# Patient Record
Sex: Female | Born: 1945 | Race: Black or African American | Hispanic: No | Smoking: Current every day smoker
Health system: Southern US, Community
[De-identification: ages and names within clinical notes are randomized; demographics above are authoritative.]

## PROBLEM LIST (undated history)

## (undated) DIAGNOSIS — F329 Major depressive disorder, single episode, unspecified: Secondary | ICD-10-CM

## (undated) DIAGNOSIS — B019 Varicella without complication: Secondary | ICD-10-CM

## (undated) DIAGNOSIS — F209 Schizophrenia, unspecified: Secondary | ICD-10-CM

## (undated) DIAGNOSIS — M1990 Unspecified osteoarthritis, unspecified site: Secondary | ICD-10-CM

## (undated) DIAGNOSIS — I10 Essential (primary) hypertension: Secondary | ICD-10-CM

## (undated) DIAGNOSIS — Z972 Presence of dental prosthetic device (complete) (partial): Secondary | ICD-10-CM

## (undated) DIAGNOSIS — K219 Gastro-esophageal reflux disease without esophagitis: Secondary | ICD-10-CM

## (undated) DIAGNOSIS — K5792 Diverticulitis of intestine, part unspecified, without perforation or abscess without bleeding: Secondary | ICD-10-CM

## (undated) DIAGNOSIS — E119 Type 2 diabetes mellitus without complications: Secondary | ICD-10-CM

## (undated) DIAGNOSIS — R42 Dizziness and giddiness: Secondary | ICD-10-CM

## (undated) DIAGNOSIS — F32A Depression, unspecified: Secondary | ICD-10-CM

## (undated) DIAGNOSIS — J449 Chronic obstructive pulmonary disease, unspecified: Secondary | ICD-10-CM

## (undated) DIAGNOSIS — E785 Hyperlipidemia, unspecified: Secondary | ICD-10-CM

## (undated) DIAGNOSIS — M5030 Other cervical disc degeneration, unspecified cervical region: Secondary | ICD-10-CM

## (undated) DIAGNOSIS — G4730 Sleep apnea, unspecified: Secondary | ICD-10-CM

## (undated) DIAGNOSIS — T7840XA Allergy, unspecified, initial encounter: Secondary | ICD-10-CM

## (undated) HISTORY — PX: CARPAL TUNNEL RELEASE: SHX101

## (undated) HISTORY — DX: Essential (primary) hypertension: I10

## (undated) HISTORY — DX: Type 2 diabetes mellitus without complications: E11.9

## (undated) HISTORY — PX: BARIATRIC SURGERY: SHX1103

## (undated) HISTORY — DX: Depression, unspecified: F32.A

## (undated) HISTORY — DX: Unspecified osteoarthritis, unspecified site: M19.90

## (undated) HISTORY — DX: Allergy, unspecified, initial encounter: T78.40XA

## (undated) HISTORY — DX: Hyperlipidemia, unspecified: E78.5

## (undated) HISTORY — DX: Chronic obstructive pulmonary disease, unspecified: J44.9

## (undated) HISTORY — DX: Major depressive disorder, single episode, unspecified: F32.9

## (undated) HISTORY — DX: Schizophrenia, unspecified: F20.9

## (undated) HISTORY — DX: Varicella without complication: B01.9

## (undated) HISTORY — PX: REPLACEMENT TOTAL KNEE BILATERAL: SUR1225

---
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
## 1992-07-22 HISTORY — PX: ABDOMINAL HYSTERECTOMY: SHX81

## 2003-03-30 LAB — HM DEXA SCAN: HM Dexa Scan: NORMAL

## 2010-07-22 HISTORY — PX: CHOLECYSTECTOMY: SHX55

## 2014-08-09 ENCOUNTER — Ambulatory Visit (INDEPENDENT_AMBULATORY_CARE_PROVIDER_SITE_OTHER): Payer: BLUE CROSS/BLUE SHIELD | Admitting: Internal Medicine

## 2014-08-09 ENCOUNTER — Encounter: Payer: Self-pay | Admitting: Internal Medicine

## 2014-08-09 ENCOUNTER — Encounter (INDEPENDENT_AMBULATORY_CARE_PROVIDER_SITE_OTHER): Payer: Self-pay

## 2014-08-09 VITALS — BP 132/84 | HR 80 | Temp 97.8°F | Ht 65.0 in | Wt 252.0 lb

## 2014-08-09 DIAGNOSIS — M75102 Unspecified rotator cuff tear or rupture of left shoulder, not specified as traumatic: Secondary | ICD-10-CM

## 2014-08-09 DIAGNOSIS — E785 Hyperlipidemia, unspecified: Secondary | ICD-10-CM

## 2014-08-09 DIAGNOSIS — J449 Chronic obstructive pulmonary disease, unspecified: Secondary | ICD-10-CM

## 2014-08-09 DIAGNOSIS — K219 Gastro-esophageal reflux disease without esophagitis: Secondary | ICD-10-CM

## 2014-08-09 DIAGNOSIS — M150 Primary generalized (osteo)arthritis: Secondary | ICD-10-CM

## 2014-08-09 DIAGNOSIS — I10 Essential (primary) hypertension: Secondary | ICD-10-CM

## 2014-08-09 DIAGNOSIS — M1990 Unspecified osteoarthritis, unspecified site: Secondary | ICD-10-CM | POA: Insufficient documentation

## 2014-08-09 DIAGNOSIS — Z131 Encounter for screening for diabetes mellitus: Secondary | ICD-10-CM

## 2014-08-09 DIAGNOSIS — M159 Polyosteoarthritis, unspecified: Secondary | ICD-10-CM

## 2014-08-09 LAB — LIPID PANEL
CHOL/HDL RATIO: 4
CHOLESTEROL: 154 mg/dL (ref 0–200)
HDL: 43.7 mg/dL (ref >39.00)
LDL CALC: 84 mg/dL (ref 0–99)
NONHDL: 110.3
Triglycerides: 131 mg/dL (ref 0.0–149.0)
VLDL: 26.2 mg/dL (ref 0.0–40.0)

## 2014-08-09 LAB — CBC
HEMATOCRIT: 43.1 % (ref 36.0–46.0)
HEMOGLOBIN: 14.1 g/dL (ref 12.0–15.0)
MCHC: 32.8 g/dL (ref 30.0–36.0)
MCV: 89.2 fl (ref 78.0–100.0)
Platelets: 287 10*3/uL (ref 150.0–400.0)
RBC: 4.83 Mil/uL (ref 3.87–5.11)
RDW: 14.4 % (ref 11.5–15.5)
WBC: 11 10*3/uL — ABNORMAL HIGH (ref 4.0–10.5)

## 2014-08-09 LAB — COMPREHENSIVE METABOLIC PANEL
ALT: 18 U/L (ref 0–35)
AST: 21 U/L (ref 0–37)
Albumin: 3.7 g/dL (ref 3.5–5.2)
Alkaline Phosphatase: 72 U/L (ref 39–117)
BUN: 11 mg/dL (ref 6–23)
CO2: 33 meq/L — AB (ref 19–32)
Calcium: 9.7 mg/dL (ref 8.4–10.5)
Chloride: 106 mEq/L (ref 96–112)
Creatinine, Ser: 0.78 mg/dL (ref 0.40–1.20)
GFR: 94.85 mL/min (ref >60.00)
Glucose, Bld: 144 mg/dL — ABNORMAL HIGH (ref 70–99)
POTASSIUM: 4.2 meq/L (ref 3.5–5.1)
Sodium: 141 mEq/L (ref 135–145)
Total Bilirubin: 0.3 mg/dL (ref 0.2–1.2)
Total Protein: 7 g/dL (ref 6.0–8.3)

## 2014-08-09 LAB — HEMOGLOBIN A1C: Hgb A1c MFr Bld: 6.8 % — ABNORMAL HIGH (ref 4.6–6.5)

## 2014-08-09 NOTE — Assessment & Plan Note (Signed)
Will review xray and MRI from previous provider Shoulder exercises given After I review the records, will likely refer to ortho fur further evaluation.

## 2014-08-09 NOTE — Progress Notes (Signed)
Pre visit review using our clinic review tool, if applicable. No additional management support is needed unless otherwise documented below in the visit note. 

## 2014-08-09 NOTE — Assessment & Plan Note (Signed)
Encouraged her to work on diet and exercsie She is s/p gastric sleeve

## 2014-08-09 NOTE — Assessment & Plan Note (Signed)
Well controlled HCTZ and Metoprolol On potassium supplement as well Will check CBC and CMET today

## 2014-08-09 NOTE — Patient Instructions (Signed)
Rotator Cuff Tear °The rotator cuff is four tendons that assist in the motion of the shoulder. A rotator cuff tear is a tear in one of these four tendons. It is characterized by pain and weakness of the shoulder. The rotator cuff tendons surround the shoulder ball and socket joint (humeral head). The rotator cuff tendons attach to the shoulder blade (scapula) on one side and the upper arm bone (humerus) on the other side. The rotator cuff is essential for shoulder stability and shoulder motion. °SYMPTOMS  °· Pain around the shoulder, often at the outer portion of the upper arm. °· Pain that is worse with shoulder function, especially when reaching overhead or lifting. °· Weakness of the shoulder muscles. °· Aching when not using your arm; often, pain awakens you at night, especially when sleeping on the affected side. °· Tenderness, swelling, warmth, or redness over the outer aspect of the shoulder. °· Loss of strength. °· Limited motion of the shoulder, especially reaching behind (reaching into one's back pocket) or across your body. °· A crackling sound (crepitation) when moving the shoulder. °· Biceps tendon pain (in the front of the shoulder) and inflammation, worse with bending the elbow or lifting. °CAUSES  °· Strain from sudden increase in amount or intensity of activity. °· Direct blow or injury to the shoulder. °· Aging, wear from from normal use. °· Roof of the shoulder (acromial) spur. °RISK INCREASES WITH:  °· Contact sports (football, wrestling, or boxing). °· Throwing or hitting sports (baseball, tennis, or volleyball). °· Weightlifting and bodybuilding. °· Heavy labor. °· Previous injury to rotator cuff. °· Failure to warm up properly before activity. °· Inadequate protective equipment. °· Increasing age. °· Spurring of the outer end of the scapula (acromion). °· Cortisone injections. °· Poor shoulder strength and flexibility. °PREVENTION °· Warm up and stretch properly before activity. °· Allow time  for rest and recovery between practices and competition. °· Maintain physical fitness: °¨ Cardiovascular fitness. °¨ Shoulder flexibility. °¨ Strength and endurance of the rotator cuff muscles and muscles of the shoulder blade. °· Learn and use proper technique when throwing or hitting. °PROGNOSIS °Surgery is often needed. Although, symptoms may go away by themselves. °RELATED COMPLICATIONS  °· Persistent pain that may progress to constant pain. °· Shoulder stiffness, frozen shoulder syndrome, or loss of motion. °· Recurrence of symptoms, especially if treated without surgery. °· Inability to return to same level of sports, even with surgery. °· Persistent weakness. °· Risks of surgery, including infection, bleeding, injury to nerves, shoulder stiffness, weakness, re-tearing of the rotator cuff tendon. °· Deltoid detachment, acromial fracture, and persistent pain. °TREATMENT °Treatment involves the use of ice and medicine to reduce pain and inflammation. Strengthening and stretching exercise are usually recommended. These exercises may be completed at home or with a therapist. You may also be instructed to modify offending activities. Corticosteroid injections may be given to reduce inflammation. Surgery is usually recommended for athletes. Surgery has the best chance for a full recovery. Surgery involves: °· Removal of an inflamed bursa. °· Removal of an acromial spur if present. °· Suturing the torn tendon back together. °Rotator cuff surgeries may be preformed either arthroscopically or through an open incision. Recovery typically takes 6 to 12 months. °MEDICATION °· If pain medicine is necessary, then nonsteroidal anti-inflammatory medicines, such as aspirin and ibuprofen, or other minor pain relievers, such as acetaminophen, are often recommended. °· Do not take pain medicine for 7 days before surgery. °· Prescription pain relievers are usually   only prescribed after surgery. Use only as directed and only as  much as you need. °· Corticosteroid injections may be given to reduce inflammation. However, there is a limited number of times the joint may be injected with these medicines. °HEAT AND COLD °· Cold treatment (icing) relieves pain and reduces inflammation. Cold treatment should be applied for 10 to 15 minutes every 2 to 3 hours for inflammation and pain and immediately after any activity that aggravates your symptoms. Use ice packs or massage the area with a piece of ice (ice massage). °· Heat treatment may be used prior to performing the stretching and strengthening activities prescribed by your caregiver, physical therapist, or athletic trainer. Use a heat pack or soak the injury in warm water. °SEEK MEDICAL CARE IF:  °· Symptoms get worse or do not improve in 4 to 6 weeks despite treatment. °· You experience pain, numbness, or coldness in the hand. °· Blue, gray, or dark color appears in the fingernails. °· New, unexplained symptoms develop (drugs used in treatment may produce side effects). °Document Released: 07/08/2005 Document Revised: 09/30/2011 Document Reviewed: 10/20/2008 °[employer]® Patient Information ©2015 [employer]. This information is not intended to replace advice given to you by your health care provider. Make sure you discuss any questions you have with your health care provider. ° °

## 2014-08-09 NOTE — Assessment & Plan Note (Signed)
Denies issues on crestor  Will check CMET and Lipid profile today

## 2014-08-09 NOTE — Assessment & Plan Note (Signed)
Mostly in hands and knees Continue Advil prn

## 2014-08-09 NOTE — Assessment & Plan Note (Signed)
She does continue to smoke but is weaning down PRN albuterol use Will refer to pulmonology

## 2014-08-09 NOTE — Assessment & Plan Note (Signed)
Well controlled on Nexium Will check CMET today

## 2014-08-09 NOTE — Progress Notes (Signed)
HPI  Pt presents to the clinic to establish care. She is transferring care from [place].  Flu: never Tetanus: Less than 10 years ago Pneumo: 04/2014 Zostovax: had one but unsure of date Colonoscopy: 2012 (5 years) Pap Smear: 12/2013 Mammogram: 01/2014 Bone Density: yes but unsure of date.  Arthritis: Mostly in hands and knees. She is taking Advil as needed.  HTN: BP well controlled on HCTZ and Metoprolol.  HLD: Denies myalgias on Crestor.  GERD: She denies breakthrough symptoms on Nexium.  She reports that she does have a left rotator cuff tear: She was being seen by the [facility]. She did recieve cortisone injections into her shoulder. She has also been to physical therapy but has not had good results. She continues to have pain in her left arm and is not able to raise it above her head. She would like referral to orthopedics today.  She also has chronic shortness of breath with exertion. She has been seen by cardiology and pulmonology. Cardiology did not think it was an issues with her heart. Pulmonology though it could be related to ? COPD. She has cut back on her smoking from 1.5 PPD to 1 pack every 3 days. She takes Albuterol prn. She request referral to pulmonology.  Past Medical History  Diagnosis Date  . Chicken pox   . Arthritis   . Allergy   . Hypertension   . Hyperlipidemia     Current Outpatient Prescriptions  Medication Sig Dispense Refill  . albuterol (PROVENTIL HFA;VENTOLIN HFA) 108 (90 BASE) MCG/ACT inhaler Inhale 2 puffs into the lungs every 6 (six) hours as needed for wheezing or shortness of breath.    . Cholecalciferol (VITAMIN D-3) 1000 UNITS CAPS Take 1 capsule by mouth daily.    Marland Kitchen esomeprazole (NEXIUM) 40 MG capsule Take 40 mg by mouth daily at 12 noon.    . hydrochlorothiazide (HYDRODIURIL) 12.5 MG tablet Take 12.5 mg by mouth daily.    . metoprolol succinate (TOPROL-XL) 100 MG 24 hr tablet Take 100 mg by mouth daily. Take with or immediately  following a meal.    . Potassium Chloride Crys CR (KLOR-CON M20 PO) Take 1 tablet by mouth daily.    . rosuvastatin (CRESTOR) 10 MG tablet Take 10 mg by mouth daily.     No current facility-administered medications for this visit.    Allergies  Allergen Reactions  . Aspirin Hives  . Darvon [Propoxyphene] Hives  . Latex Hives, Itching and Swelling  . Naproxen Hives and Itching    Family History  Problem Relation Age of Onset  . Heart disease Mother   . Arthritis Father   . Cancer Father     Prostate  . Diabetes Sister   . Diabetes Brother     History   Social History  . Marital Status: Widowed    Spouse Name: N/A    Number of Children: N/A  . Years of Education: N/A   Occupational History  . Not on file.   Social History Main Topics  . Smoking status: Current Every Day Smoker -- 0.33 packs/day    Types: Cigarettes  . Smokeless tobacco: Never Used  . Alcohol Use: No  . Drug Use: Not on file  . Sexual Activity: Not on file   Other Topics Concern  . Not on file   Social History Narrative  . No narrative on file    ROS:  Constitutional: Denies fever, malaise, fatigue, headache or abrupt weight changes.  HEENT: Denies  eye pain, eye redness, ear pain, ringing in the ears, wax buildup, runny nose, nasal congestion, bloody nose, or sore throat. Respiratory: Pt reports shortness of breath. Denies difficulty breathing, cough or sputum production.   Cardiovascular: Denies chest pain, chest tightness, palpitations or swelling in the hands or feet.  Gastrointestinal: Denies abdominal pain, bloating, constipation, diarrhea or blood in the stool.  Musculoskeletal: Pt reports joint pains. Denies decrease in range of motion, difficulty with gait, muscle pain or joint swelling.  Skin: Denies redness, rashes, lesions or ulcercations.  Neurological: Denies dizziness, difficulty with memory, difficulty with speech or problems with balance and coordination.  Psych: Denies  anxiety, depression, SI/HI.  No other specific complaints in a complete review of systems (except as listed in HPI above).  PE:  Ht 5\' 5"  (1.651 m)  Wt 252 lb (114.306 kg)  BMI 41.93 kg/m2 Wt Readings from Last 3 Encounters:  08/09/14 252 lb (114.306 kg)    General: Appears her stated age, well developed, well nourished in NAD. HEENT: Head: normal shape and size; Eyes: sclera white, no icterus, conjunctiva pink, PERRLA and EOMs intact;  Cardiovascular: Normal rate and rhythm. S1,S2 noted.  No murmur, rubs or gallops noted. No JVD or BLE edema. No carotid bruits noted. Pulmonary/Chest: Normal effort and positive vesicular breath sounds. No respiratory distress. No wheezes, rales or ronchi noted.  Abdomen: Soft and nontender. Normal bowel sounds, no bruits noted. No distention or masses noted. Liver, spleen and kidneys non palpable. Musculoskeletal: Normal range of motion. No signs of joint swelling. Herbdens nodes noted bilaterally. Positive drop can on the left. Neurological: Alert and oriented.  Psychiatric: Mood and affect normal. Behavior is normal. Judgment and thought content normal.    Assessment and Plan:  Health Maintenance:  Will review records to find out dates of zostovax and bone density exam Will check A1C given obesity and family history of DM 2 All medications will be adjusted/refilled after labs are back  RTC in 6 months to follow up chronic conditions

## 2014-08-10 ENCOUNTER — Telehealth: Payer: Self-pay | Admitting: Internal Medicine

## 2014-08-10 NOTE — Telephone Encounter (Signed)
emmi emailed °

## 2014-08-15 ENCOUNTER — Other Ambulatory Visit: Payer: Self-pay | Admitting: Internal Medicine

## 2014-08-15 ENCOUNTER — Telehealth: Payer: Self-pay | Admitting: *Deleted

## 2014-08-15 DIAGNOSIS — M25512 Pain in left shoulder: Secondary | ICD-10-CM

## 2014-08-15 DIAGNOSIS — E119 Type 2 diabetes mellitus without complications: Secondary | ICD-10-CM

## 2014-08-15 MED ORDER — METFORMIN HCL 500 MG PO TABS: 500.0000 mg | ORAL_TABLET | Freq: Two times a day (BID) | ORAL | Status: DC

## 2014-08-15 MED ORDER — TRAMADOL HCL 50 MG PO TABS: 50.0000 mg | ORAL_TABLET | Freq: Three times a day (TID) | ORAL | Status: DC | PRN

## 2014-08-15 NOTE — Telephone Encounter (Signed)
Pt returned call re: her lab results. She is interested in treatment, and wants to know what plan you have come up with for her?  She mentioned that she left noted from ortho she had seen in the past, and wanted to let you know she is still having severe shoulder pain. She is aware that you would refer her to ortho after reviewing notes. Please advise what to do for pain?  She also said that she would [facility]

## 2014-08-15 NOTE — Telephone Encounter (Signed)
Call pt:  For diabetes- I want her to work on low carb diet and will send in Metformin 500 mg BID with meals # 60, 2 refills, go ahead and send in RX  For shoulder pain, will go ahead and refer her to ortho. Can take tramadol 50 mg for pain. Please phone in 50 mg Q8H prn for pain # 30, 0 refills

## 2014-08-16 ENCOUNTER — Other Ambulatory Visit: Payer: Self-pay

## 2014-08-16 DIAGNOSIS — M25512 Pain in left shoulder: Secondary | ICD-10-CM

## 2014-08-16 MED ORDER — ESOMEPRAZOLE MAGNESIUM 40 MG PO CPDR: 40.0000 mg | DELAYED_RELEASE_CAPSULE | Freq: Every day | ORAL | Status: DC

## 2014-08-16 MED ORDER — ALBUTEROL SULFATE HFA 108 (90 BASE) MCG/ACT IN AERS
2.0000 | INHALATION_SPRAY | Freq: Four times a day (QID) | RESPIRATORY_TRACT | Status: DC | PRN
Start: 2014-08-16 — End: 2016-10-28

## 2014-08-16 MED ORDER — POTASSIUM CHLORIDE CRYS ER 20 MEQ PO TBCR: 20.0000 meq | EXTENDED_RELEASE_TABLET | Freq: Every day | ORAL | Status: DC

## 2014-08-16 MED ORDER — METOPROLOL SUCCINATE ER 100 MG PO TB24: 100.0000 mg | ORAL_TABLET | Freq: Every day | ORAL | Status: DC

## 2014-08-16 MED ORDER — ROSUVASTATIN CALCIUM 10 MG PO TABS: 10.0000 mg | ORAL_TABLET | Freq: Every day | ORAL | Status: DC

## 2014-08-16 MED ORDER — HYDROCHLOROTHIAZIDE 12.5 MG PO TABS: 12.5000 mg | ORAL_TABLET | Freq: Every day | ORAL | Status: DC

## 2014-08-16 MED ORDER — TRAMADOL HCL 50 MG PO TABS: 50.0000 mg | ORAL_TABLET | Freq: Three times a day (TID) | ORAL | Status: DC | PRN

## 2014-08-16 NOTE — Telephone Encounter (Signed)
Pt is aware as instructed--Rx called into pharmacy  

## 2014-09-01 ENCOUNTER — Encounter: Payer: Self-pay | Admitting: Internal Medicine

## 2014-09-01 DIAGNOSIS — E119 Type 2 diabetes mellitus without complications: Secondary | ICD-10-CM | POA: Insufficient documentation

## 2014-09-06 ENCOUNTER — Ambulatory Visit (INDEPENDENT_AMBULATORY_CARE_PROVIDER_SITE_OTHER): Payer: Medicare Other | Admitting: Internal Medicine

## 2014-09-06 ENCOUNTER — Encounter: Payer: Self-pay | Admitting: Internal Medicine

## 2014-09-06 VITALS — BP 122/72 | HR 91 | Ht 65.0 in | Wt 247.0 lb

## 2014-09-06 DIAGNOSIS — R0609 Other forms of dyspnea: Secondary | ICD-10-CM | POA: Diagnosis not present

## 2014-09-06 DIAGNOSIS — J449 Chronic obstructive pulmonary disease, unspecified: Secondary | ICD-10-CM | POA: Diagnosis not present

## 2014-09-06 DIAGNOSIS — G4733 Obstructive sleep apnea (adult) (pediatric): Secondary | ICD-10-CM

## 2014-09-06 DIAGNOSIS — R0600 Dyspnea, unspecified: Secondary | ICD-10-CM

## 2014-09-06 DIAGNOSIS — Z720 Tobacco use: Secondary | ICD-10-CM

## 2014-09-06 DIAGNOSIS — Z9989 Dependence on other enabling machines and devices: Secondary | ICD-10-CM

## 2014-09-06 DIAGNOSIS — F17200 Nicotine dependence, unspecified, uncomplicated: Secondary | ICD-10-CM | POA: Insufficient documentation

## 2014-09-06 NOTE — Assessment & Plan Note (Signed)
Multifactorial: Tobacco abuse, COPD, obesity, deconditioning.  Plan: -COPD optimization as stated -Tobacco Cessation -continue exercise program -Diet and weight loss.

## 2014-09-06 NOTE — Assessment & Plan Note (Signed)
Diagnosed in [place] 3 years ago, currently on CPAP wearing nightly. Currently awaiting records, continue with current CPAP usage. Address following with sleep specialist at next visit.   OSA Encouraged proper weight management.  Excessive weight may contribute to snoring.  Monitor sedative use.  Discussed driving precautions and its relationship with hypersomnolence.  Discussed operating dangerous equipment and its relationship with hypersomnolence.  Discussed sleep hygiene, and benefits of a fixed sleep waked time.  The importance of getting eight or more hours of sleep discussed with patient.  Discussed limiting the use of the computer and television before bedtime.  Decrease naps during the day, so night time sleep will become enhanced.  Limit caffeine, and sleep deprivation.  HTN, stroke, and heart failure are potential risk factors.

## 2014-09-06 NOTE — Assessment & Plan Note (Signed)
Tobacco Cessation - Counseling regarding benefits of smoking cessation strategies was provided for more than 12 min. - Educated that at this time smoking- cessation represents the single most important step that patient can take to enhance the length and quality of live. - Educated patient regarding alternatives of behavior interventions, pharmacotherapy including NRT and non-nicotine therapy such, and combinations of both. - Patient at this time:is willing to quit on her own, by reducing the daily use of cigarettes. Today we also set a quit date. - Quit date:12/21/2014

## 2014-09-06 NOTE — Assessment & Plan Note (Addendum)
COPD-mild type CAT score 5, mMRC score 1  I suspect the patient does have some underlying COPD, however she's not greatly symptomatic. She is probably a type A COPD who only requires bronchodilator when needed, does not need to be on maintenance bronchodilator therapy.  Plan: -Pulmonary function testing, 6 minute walk test, 2 view chest x-ray -Patient educated on the use of albuterol inhaler and advised to use only as a rescue medication for shortness of breath/wheezing/cough. -Tobacco Cessation will be paramount in alleviating some of the symptoms she is having related to her COPD. -[facility]

## 2014-09-06 NOTE — Patient Instructions (Addendum)
Follow up with Dr. Dema SeverinMungal in 1 month - use albuterol inhaler as needed for shortness of breath\cough\wheezing - pulmonary function testing,chest xray and 6 minute walk test prior to follow up visit - standard work up for COPD - continue using your cpap nightly - stop smoking (remember we set a quit date for 12/21/14).  - we will obtain records for you last sleep study.

## 2014-09-06 NOTE — Progress Notes (Signed)
Date: 09/06/2014  MRN# [id number] Kim Harding May 27, 1946  Referring Physician: NP R. Baity  Kim Harding is a 69 y.o. old female seen in consultation for dyspnea on exertion  CC:  Chief Complaint  Patient presents with  . Advice Only    Pt denies cough, wheezing and chest tightness; she may have sob with exhertion. She wears CPAP 4-6 hrs nightly.    HPI:  Patient is a pleasant 69 year old female is referred for further evaluation of COPD. Patient states that she recently moved to [facility], 05/2014, recently moved from [place], was seeing a pulmonologist there; told that she had mild copd, and placed placed on PRN albuterol. She also had a complaint of dyspnea on exertion which prompted the visit to see pulmonary today. The skin exertion history as stated below.  Currently attending the [facility], going 3 days a week, and participating their senior citizen cardio program.   DOE - baseline for 2-3 years, mostly with stair climbing, can climb about 1 flight stairs, can walk about 50 yards.  OSA - on cpap for the past 3 years, DME - [employer], wearing cpap machine nightly (4-6 hrs).  Had split study done in [place] 2015.    Smoking Hx - currently smoking 4-6 cigs per day - 1ppd x 50 years - quit for 1.5 years, but restarted 07/2002  Current COPD med - PRN albuterol inhaler Prior to PNA immunization, patient stated that she got a yearly URI sometimes requiring steroids and antibiotics, has not had an episode of this in the last 5 years.  No pets, worked in administration mostly. Allergies act up in the Spring - congestion, sneezing, runny nose, watery eyes.   - Spring 2015 required antibiotics and steroids due to bad allergy season causing sinusitis  Patient states that she's not using albuterol inhaler on a regular basis, maybe once a month if she really exerts himself. She is currently still smoking but is actively trying to quit. Today she denies any cough, worsening shortness of breath,  sputum production, fever, night sweats, chills, rapid weight loss. She also carries a diagnosis of sleep apnea, currently does not have a sleep specialist that she follows with.  PMHX:   Past Medical History  Diagnosis Date  . Chicken pox   . Arthritis   . Allergy   . Hypertension   . Hyperlipidemia    Surgical Hx:  Past Surgical History  Procedure Laterality Date  . Cholecystectomy  2012  . Tonsillectomy  1960  . Abdominal hysterectomy  1994    partial  . Replacement total knee bilateral    . Bariatric surgery      sleeve--2014   Family Hx:  Family History  Problem Relation Age of Onset  . Heart disease Mother   . Arthritis Father   . Cancer Father     Prostate  . Diabetes Sister   . Diabetes Brother    Social Hx:   History  Substance Use Topics  . Smoking status: Current Every Day Smoker -- 0.33 packs/day for 50 years    Types: Cigarettes  . Smokeless tobacco: Never Used  . Alcohol Use: No   Medication:   Current Outpatient Rx  Name  Route  Sig  Dispense  Refill  . albuterol (PROVENTIL HFA;VENTOLIN HFA) 108 (90 BASE) MCG/ACT inhaler   Inhalation   Inhale 2 puffs into the lungs every 6 (six) hours as needed for wheezing or shortness of breath.   6.7 Inhaler   3   .  Cholecalciferol (VITAMIN D-3) 1000 UNITS CAPS   Oral   Take 1 capsule by mouth daily.         Marland Kitchen esomeprazole (NEXIUM) 40 MG capsule   Oral   Take 1 capsule (40 mg total) by mouth daily at 12 noon.   90 capsule   3   . hydrochlorothiazide (HYDRODIURIL) 12.5 MG tablet   Oral   Take 1 tablet (12.5 mg total) by mouth daily.   90 tablet   1   . metFORMIN (GLUCOPHAGE) 500 MG tablet   Oral   Take 1 tablet (500 mg total) by mouth 2 (two) times daily with a meal.   60 tablet   2   . metoprolol succinate (TOPROL-XL) 100 MG 24 hr tablet   Oral   Take 1 tablet (100 mg total) by mouth daily. Take with or immediately following a meal.   90 tablet   1   . potassium chloride SA (KLOR-CON  M20) 20 MEQ tablet   Oral   Take 1 tablet (20 mEq total) by mouth daily.   90 tablet   0   . rosuvastatin (CRESTOR) 10 MG tablet   Oral   Take 1 tablet (10 mg total) by mouth daily.   90 tablet   1       Allergies:  Aspirin; Darvon; Latex; and Naproxen  Review of Systems: Gen:  Denies  fever, sweats, chills HEENT: Denies blurred vision, double vision, ear pain, eye pain, hearing loss, nose bleeds, sore throat Cvc:  No dizziness, chest pain or heaviness Resp:   Denies cough or sputum porduction, shortness of breath Gi: Denies swallowing difficulty, stomach pain, nausea or vomiting, diarrhea, constipation, bowel incontinence Gu:  Denies bladder incontinence, burning urine Ext:   No Joint pain, stiffness or swelling Skin: No skin rash, easy bruising or bleeding or hives Endoc:  No polyuria, polydipsia , polyphagia or weight change Psych: No depression, insomnia or hallucinations  Other:  All other systems negative  Physical Examination:   VS: BP 122/72 mmHg  Pulse 91  Ht  (1.651 m)  Wt 247 lb (112.038 kg)  BMI 41.10 kg/m2  SpO2 100%  General Appearance: No distress  Neuro:without focal findings, mental status, speech normal, alert and oriented, cranial nerves 2-12 intact, reflexes normal and symmetric, sensation grossly normal  HEENT: PERRLA, EOM intact, no ptosis, no other lesions noticed; Mallampati 2 Pulmonary: normal breath sounds., diaphragmatic excursion normal.No wheezing, No rales;   Sputum Production:  none CardiovascularNormal S1,S2.  No m/r/g.  Abdominal aorta pulsation normal.    Abdomen: Benign, Soft, non-tender, No masses, hepatosplenomegaly, No lymphadenopathy Renal:  No costovertebral tenderness  GU:  No performed at this time. Endoc: No evident thyromegaly, no signs of acromegaly or Cushing features Skin:   warm, no rashes, no ecchymosis  Extremities: normal, no cyanosis, clubbing, no edema, warm with normal capillary refill. Other findings:none      Assessment and Plan: Tobacco abuse Tobacco Cessation - Counseling regarding benefits of smoking cessation strategies was provided for more than 12 min. - Educated that at this time smoking- cessation represents the single most important step that patient can take to enhance the length and quality of live. - Educated patient regarding alternatives of behavior interventions, pharmacotherapy including NRT and non-nicotine therapy such, and combinations of both. - Patient at this time:is willing to quit on her own, by reducing the daily use of cigarettes. Today we also set a quit date. - Quit date:12/21/2014  COPD, mild COPD-mild type CAT score 5, mMRC score 1  I suspect the patient does have some underlying COPD, however she's not greatly symptomatic. She is probably a type A COPD who only requires bronchodilator when needed, does not need to be on maintenance bronchodilator therapy.  Plan: -Pulmonary function testing, 6 minute walk test, 2 view chest x-ray -Patient educated on the use of albuterol inhaler and advised to use only as a rescue medication for shortness of breath/wheezing/cough. -Tobacco Cessation will be paramount in alleviating some of the symptoms she is having related to her COPD. -[facility] at [facility]   DOE (dyspnea on exertion) Multifactorial: Tobacco abuse, COPD, obesity, deconditioning.  Plan: -COPD optimization as stated -Tobacco Cessation -continue exercise program -Diet and weight loss.     OSA on CPAP Diagnosed in [place] 3 years ago, currently on CPAP wearing nightly. Currently awaiting records, continue with current CPAP usage. Address following with sleep specialist at next visit.   OSA Encouraged proper weight management.  Excessive weight may contribute to snoring.  Monitor sedative use.  Discussed driving precautions and its relationship with hypersomnolence.  Discussed operating dangerous equipment and its  relationship with hypersomnolence.  Discussed sleep hygiene, and benefits of a fixed sleep waked time.  The importance of getting eight or more hours of sleep discussed with patient.  Discussed limiting the use of the computer and television before bedtime.  Decrease naps during the day, so night time sleep will become enhanced.  Limit caffeine, and sleep deprivation.  HTN, stroke, and heart failure are potential risk factors.          Updated Medication List Outpatient Encounter Prescriptions as of 09/06/2014  Medication Sig  . albuterol (PROVENTIL HFA;VENTOLIN HFA) 108 (90 BASE) MCG/ACT inhaler Inhale 2 puffs into the lungs every 6 (six) hours as needed for wheezing or shortness of breath.  . Cholecalciferol (VITAMIN D-3) 1000 UNITS CAPS Take 1 capsule by mouth daily.  Marland Kitchen. esomeprazole (NEXIUM) 40 MG capsule Take 1 capsule (40 mg total) by mouth daily at 12 noon.  . hydrochlorothiazide (HYDRODIURIL) 12.5 MG tablet Take 1 tablet (12.5 mg total) by mouth daily.  . metFORMIN (GLUCOPHAGE) 500 MG tablet Take 1 tablet (500 mg total) by mouth 2 (two) times daily with a meal.  . metoprolol succinate (TOPROL-XL) 100 MG 24 hr tablet Take 1 tablet (100 mg total) by mouth daily. Take with or immediately following a meal.  . potassium chloride SA (KLOR-CON M20) 20 MEQ tablet Take 1 tablet (20 mEq total) by mouth daily.  . rosuvastatin (CRESTOR) 10 MG tablet Take 1 tablet (10 mg total) by mouth daily.  . [DISCONTINUED] traMADol (ULTRAM) 50 MG tablet Take 1 tablet (50 mg total) by mouth every 8 (eight) hours as needed. (Patient not taking: Reported on 09/06/2014)    Orders for this visit: Orders Placed This Encounter  Procedures  . DG Chest 2 View    Standing Status: Future     Number of Occurrences:      Standing Expiration Date: 11/05/2015    Scheduling Instructions:     To be done week of 10/03/14.    Order Specific Question:  Reason for Exam (SYMPTOM  OR DIAGNOSIS REQUIRED)    Answer:  sob     Order Specific Question:  Preferred imaging location?    Answer:  [facility]  . Pulmonary function test    Standing Status: Future     Number of Occurrences:      Standing  Expiration Date: 09/07/2015    Scheduling Instructions:     Scheduled in [place] 10/10/14.[place]    Order Specific Question:  Where should this test be performed?    Answer:  [facility]    Order Specific Question:  Full PFT: includes the following: basic spirometry, spirometry pre & post bronchodilator, diffusion capacity (DLCO), lung volumes    Answer:  Full PFT    Order Specific Question:  MIP/MEP    Answer:  No    Order Specific Question:  6 minute walk    Answer:  Yes    Order Specific Question:  ABG    Answer:  No    Order Specific Question:  Lung volumes    Answer:  No    Order Specific Question:  Methacholine challenge    Answer:  No     Thank  you for the consultation and for allowing [facility] to assist in the care of your patient. Our recommendations are noted above.  Please contact us if we can be of further service.   Stephanie Acre, MD [facility] Office Number: [phone]

## 2014-09-12 ENCOUNTER — Ambulatory Visit (INDEPENDENT_AMBULATORY_CARE_PROVIDER_SITE_OTHER)
Admission: RE | Admit: 2014-09-12 | Discharge: 2014-09-12 | Disposition: A | Discharge status: Home / Self Care | Payer: BLUE CROSS/BLUE SHIELD | Source: Ambulatory Visit | Attending: Internal Medicine | Admitting: Internal Medicine

## 2014-09-12 DIAGNOSIS — R0600 Dyspnea, unspecified: Secondary | ICD-10-CM | POA: Diagnosis not present

## 2014-09-12 DIAGNOSIS — J449 Chronic obstructive pulmonary disease, unspecified: Secondary | ICD-10-CM

## 2014-09-12 DIAGNOSIS — Z87891 Personal history of nicotine dependence: Secondary | ICD-10-CM | POA: Diagnosis not present

## 2014-09-13 ENCOUNTER — Encounter: Payer: Self-pay | Admitting: Internal Medicine

## 2014-09-13 ENCOUNTER — Ambulatory Visit (INDEPENDENT_AMBULATORY_CARE_PROVIDER_SITE_OTHER): Payer: BLUE CROSS/BLUE SHIELD | Admitting: Internal Medicine

## 2014-09-13 VITALS — BP 132/78 | HR 74 | Temp 98.4°F | Wt 248.5 lb

## 2014-09-13 DIAGNOSIS — IMO0001 Reserved for inherently not codable concepts without codable children: Secondary | ICD-10-CM

## 2014-09-13 DIAGNOSIS — S46812D Strain of other muscles, fascia and tendons at shoulder and upper arm level, left arm, subsequent encounter: Secondary | ICD-10-CM | POA: Diagnosis not present

## 2014-09-13 DIAGNOSIS — M25512 Pain in left shoulder: Secondary | ICD-10-CM | POA: Diagnosis not present

## 2014-09-13 MED ORDER — IBUPROFEN 800 MG PO TABS: 800.0000 mg | ORAL_TABLET | Freq: Three times a day (TID) | ORAL | Status: DC | PRN

## 2014-09-13 NOTE — Progress Notes (Signed)
Pre visit review using our clinic review tool, if applicable. No additional management support is needed unless otherwise documented below in the visit note. 

## 2014-09-13 NOTE — Progress Notes (Signed)
Subjective:    Patient ID: Kim Harding, female    DOB: January 24, 1946, 69 y.o.   MRN: [id number]  HPI  Pt presents to the clinic today with continued left shoulder pain. 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 MRI from previous PCP reviewed. She reports the pain feels achy. It does have a slight burning sensation. She reports the pain is a 12 on a scale of 1-10. She has trouble lifting her arms above her head. It is interfering with her daily activities. The tramadol seems to help in the beginning but reports it is no longer effective. She has also been using Ibuprofen OTC (1200 mg at one time).  Review of Systems      Past Medical History  Diagnosis Date  . Chicken pox   . Arthritis   . Allergy   . Hypertension   . Hyperlipidemia     Current Outpatient Prescriptions  Medication Sig Dispense Refill  . albuterol (PROVENTIL HFA;VENTOLIN HFA) 108 (90 BASE) MCG/ACT inhaler Inhale 2 puffs into the lungs every 6 (six) hours as needed for wheezing or shortness of breath. 6.7 Inhaler 3  . Cholecalciferol (VITAMIN D-3) 1000 UNITS CAPS Take 1 capsule by mouth daily.    Marland Kitchen. esomeprazole (NEXIUM) 40 MG capsule Take 1 capsule (40 mg total) by mouth daily at 12 noon. 90 capsule 3  . hydrochlorothiazide (HYDRODIURIL) 12.5 MG tablet Take 1 tablet (12.5 mg total) by mouth daily. 90 tablet 1  . metFORMIN (GLUCOPHAGE) 500 MG tablet Take 1 tablet (500 mg total) by mouth 2 (two) times daily with a meal. 60 tablet 2  . metoprolol succinate (TOPROL-XL) 100 MG 24 hr tablet Take 1 tablet (100 mg total) by mouth daily. Take with or immediately following a meal. 90 tablet 1  . potassium chloride SA (KLOR-CON M20) 20 MEQ tablet Take 1 tablet (20 mEq total) by mouth daily. 90 tablet 0  . rosuvastatin (CRESTOR) 10 MG tablet Take 1 tablet (10 mg total) by mouth daily. 90 tablet 1   No current facility-administered medications for this visit.    Allergies  Allergen  Reactions  . Aspirin Hives  . Darvon [Propoxyphene] Hives  . Latex Hives, Itching and Swelling  . Naproxen Hives and Itching    Family History  Problem Relation Age of Onset  . Heart disease Mother   . Arthritis Father   . Cancer Father     Prostate  . Diabetes Sister   . Diabetes Brother     History   Social History  . Marital Status: Widowed    Spouse Name: N/A  . Number of Children: N/A  . Years of Education: N/A   Occupational History  . Not on file.   Social History Main Topics  . Smoking status: Current Every Day Smoker -- 0.33 packs/day for 50 years    Types: Cigarettes  . Smokeless tobacco: Never Used  . Alcohol Use: No  . Drug Use: No  . Sexual Activity: Not Currently   Other Topics Concern  . Not on file   Social History Narrative     Constitutional: Denies fever, malaise, fatigue, headache or abrupt weight changes.  Respiratory: Denies difficulty breathing, shortness of breath, cough or sputum production.   Cardiovascular: Denies chest pain, chest tightness, palpitations or swelling in the hands or feet.  Musculoskeletal: Pt reports left shoulder pain. Denies difficulty with gait, muscle pain or joint swelling.   No other specific complaints in  a complete review of systems (except as listed in HPI above).  Objective:   Physical Exam   BP 132/78 mmHg  Pulse 74  Temp(Src) 98.4 F (36.9 C) (Oral)  Wt 248 lb 8 oz (112.719 kg)  SpO2 98% Wt Readings from Last 3 Encounters:  09/13/14 248 lb 8 oz (112.719 kg)  09/06/14 247 lb (112.038 kg)  08/09/14 252 lb (114.306 kg)    General: Appears her stated age, obese but in NAD. Skin: Warm, dry and intact. No rashes, lesions or ulcerations noted. Cardiovascular: Normal rate and rhythm. S1,S2 noted.  No murmur, rubs or gallops noted.  Pulmonary/Chest: Normal effort and positive vesicular breath sounds. No respiratory distress. No wheezes, rales or ronchi noted.  Musculoskeletal: Decreased internal and  external rotation of left shoulder. Pain with palpation of the left AC joint and subacromial bursa. Some pain with palpation of the anterior proximal biceps tendon. Negative drop can test. Strength 4/5 LUE, 5/5 RUE. Neurological: Alert and oriented. Sensation intact to BUE.  BMET    Component Value Date/Time   NA 141 08/09/2014 1131   K 4.2 08/09/2014 1131   CL 106 08/09/2014 1131   CO2 33* 08/09/2014 1131   GLUCOSE 144* 08/09/2014 1131   BUN 11 08/09/2014 1131   CREATININE 0.78 08/09/2014 1131   CALCIUM 9.7 08/09/2014 1131    Lipid Panel     Component Value Date/Time   CHOL 154 08/09/2014 1131   TRIG 131.0 08/09/2014 1131   HDL 43.70 08/09/2014 1131   CHOLHDL 4 08/09/2014 1131   VLDL 26.2 08/09/2014 1131   LDLCALC 84 08/09/2014 1131    CBC    Component Value Date/Time   WBC 11.0* 08/09/2014 1131   RBC 4.83 08/09/2014 1131   HGB 14.1 08/09/2014 1131   HCT 43.1 08/09/2014 1131   PLT 287.0 08/09/2014 1131   MCV 89.2 08/09/2014 1131   MCHC 32.8 08/09/2014 1131   RDW 14.4 08/09/2014 1131    Hgb A1C Lab Results  Component Value Date   HGBA1C 6.8* 08/09/2014        Assessment & Plan:   Left shoulder pain secondary to supraspinatus tear:  MRI reviewed Will give RX for 800 mg Ibuprofen TID prn (advised her 1200 mg is way too much and to not take any additional Ibuprofen OTC) Will refer her to Orthopedics for further evaluation and treatment  RTC as needed or if your symptoms persist or worsen

## 2014-09-13 NOTE — Patient Instructions (Signed)

## 2014-09-14 DIAGNOSIS — M67912 Unspecified disorder of synovium and tendon, left shoulder: Secondary | ICD-10-CM | POA: Diagnosis not present

## 2014-09-14 DIAGNOSIS — M67911 Unspecified disorder of synovium and tendon, right shoulder: Secondary | ICD-10-CM | POA: Diagnosis not present

## 2014-09-16 ENCOUNTER — Telehealth: Payer: Self-pay | Admitting: Internal Medicine

## 2014-09-16 NOTE — Telephone Encounter (Signed)
Labs faxed

## 2014-09-16 NOTE — Telephone Encounter (Signed)
Nettie ElmSylvia from surgical center called requesting a copy of latest labs for pt.  Please fax to [phone] or call [phone] if you have questions.  She stated this is a private line so you can leave a detailed message.  Thanks.

## 2014-09-20 DIAGNOSIS — M19012 Primary osteoarthritis, left shoulder: Secondary | ICD-10-CM | POA: Diagnosis not present

## 2014-09-20 DIAGNOSIS — M66812 Spontaneous rupture of other tendons, left shoulder: Secondary | ICD-10-CM | POA: Diagnosis not present

## 2014-09-20 DIAGNOSIS — M75122 Complete rotator cuff tear or rupture of left shoulder, not specified as traumatic: Secondary | ICD-10-CM | POA: Diagnosis not present

## 2014-09-20 DIAGNOSIS — G8918 Other acute postprocedural pain: Secondary | ICD-10-CM | POA: Diagnosis not present

## 2014-09-20 DIAGNOSIS — M7542 Impingement syndrome of left shoulder: Secondary | ICD-10-CM | POA: Diagnosis not present

## 2014-09-20 HISTORY — PX: SHOULDER SURGERY: SHX246

## 2014-09-28 DIAGNOSIS — Z9889 Other specified postprocedural states: Secondary | ICD-10-CM | POA: Diagnosis not present

## 2014-09-29 DIAGNOSIS — M25612 Stiffness of left shoulder, not elsewhere classified: Secondary | ICD-10-CM | POA: Diagnosis not present

## 2014-09-29 DIAGNOSIS — M25512 Pain in left shoulder: Secondary | ICD-10-CM | POA: Diagnosis not present

## 2014-09-29 DIAGNOSIS — S43422D Sprain of left rotator cuff capsule, subsequent encounter: Secondary | ICD-10-CM | POA: Diagnosis not present

## 2014-10-03 DIAGNOSIS — M25612 Stiffness of left shoulder, not elsewhere classified: Secondary | ICD-10-CM | POA: Diagnosis not present

## 2014-10-03 DIAGNOSIS — M25512 Pain in left shoulder: Secondary | ICD-10-CM | POA: Diagnosis not present

## 2014-10-03 DIAGNOSIS — S43422D Sprain of left rotator cuff capsule, subsequent encounter: Secondary | ICD-10-CM | POA: Diagnosis not present

## 2014-10-07 DIAGNOSIS — M25512 Pain in left shoulder: Secondary | ICD-10-CM | POA: Diagnosis not present

## 2014-10-07 DIAGNOSIS — M25612 Stiffness of left shoulder, not elsewhere classified: Secondary | ICD-10-CM | POA: Diagnosis not present

## 2014-10-07 DIAGNOSIS — S43422D Sprain of left rotator cuff capsule, subsequent encounter: Secondary | ICD-10-CM | POA: Diagnosis not present

## 2014-10-10 ENCOUNTER — Ambulatory Visit: Payer: BLUE CROSS/BLUE SHIELD | Admitting: Internal Medicine

## 2014-10-10 ENCOUNTER — Ambulatory Visit: Payer: BLUE CROSS/BLUE SHIELD

## 2014-10-10 DIAGNOSIS — M25512 Pain in left shoulder: Secondary | ICD-10-CM | POA: Diagnosis not present

## 2014-10-10 DIAGNOSIS — M25612 Stiffness of left shoulder, not elsewhere classified: Secondary | ICD-10-CM | POA: Diagnosis not present

## 2014-10-10 DIAGNOSIS — S43422D Sprain of left rotator cuff capsule, subsequent encounter: Secondary | ICD-10-CM | POA: Diagnosis not present

## 2014-10-12 DIAGNOSIS — M25612 Stiffness of left shoulder, not elsewhere classified: Secondary | ICD-10-CM | POA: Diagnosis not present

## 2014-10-12 DIAGNOSIS — S43422D Sprain of left rotator cuff capsule, subsequent encounter: Secondary | ICD-10-CM | POA: Diagnosis not present

## 2014-10-12 DIAGNOSIS — M25512 Pain in left shoulder: Secondary | ICD-10-CM | POA: Diagnosis not present

## 2014-10-17 DIAGNOSIS — M25612 Stiffness of left shoulder, not elsewhere classified: Secondary | ICD-10-CM | POA: Diagnosis not present

## 2014-10-17 DIAGNOSIS — S43422D Sprain of left rotator cuff capsule, subsequent encounter: Secondary | ICD-10-CM | POA: Diagnosis not present

## 2014-10-17 DIAGNOSIS — M25512 Pain in left shoulder: Secondary | ICD-10-CM | POA: Diagnosis not present

## 2014-10-19 DIAGNOSIS — S43422D Sprain of left rotator cuff capsule, subsequent encounter: Secondary | ICD-10-CM | POA: Diagnosis not present

## 2014-10-19 DIAGNOSIS — M25512 Pain in left shoulder: Secondary | ICD-10-CM | POA: Diagnosis not present

## 2014-10-19 DIAGNOSIS — M25612 Stiffness of left shoulder, not elsewhere classified: Secondary | ICD-10-CM | POA: Diagnosis not present

## 2014-10-20 DIAGNOSIS — Z9889 Other specified postprocedural states: Secondary | ICD-10-CM | POA: Diagnosis not present

## 2014-10-24 ENCOUNTER — Ambulatory Visit (INDEPENDENT_AMBULATORY_CARE_PROVIDER_SITE_OTHER): Payer: BLUE CROSS/BLUE SHIELD | Admitting: Internal Medicine

## 2014-10-24 ENCOUNTER — Telehealth: Payer: Self-pay | Admitting: *Deleted

## 2014-10-24 ENCOUNTER — Encounter: Payer: Self-pay | Admitting: Internal Medicine

## 2014-10-24 VITALS — BP 128/66 | HR 94 | Ht 65.0 in | Wt 243.0 lb

## 2014-10-24 DIAGNOSIS — G4733 Obstructive sleep apnea (adult) (pediatric): Secondary | ICD-10-CM

## 2014-10-24 DIAGNOSIS — J449 Chronic obstructive pulmonary disease, unspecified: Secondary | ICD-10-CM

## 2014-10-24 DIAGNOSIS — R0609 Other forms of dyspnea: Secondary | ICD-10-CM | POA: Diagnosis not present

## 2014-10-24 DIAGNOSIS — Z720 Tobacco use: Secondary | ICD-10-CM | POA: Diagnosis not present

## 2014-10-24 DIAGNOSIS — R0600 Dyspnea, unspecified: Secondary | ICD-10-CM

## 2014-10-24 DIAGNOSIS — M25512 Pain in left shoulder: Secondary | ICD-10-CM | POA: Diagnosis not present

## 2014-10-24 DIAGNOSIS — Z9989 Dependence on other enabling machines and devices: Secondary | ICD-10-CM

## 2014-10-24 DIAGNOSIS — S43422D Sprain of left rotator cuff capsule, subsequent encounter: Secondary | ICD-10-CM | POA: Diagnosis not present

## 2014-10-24 DIAGNOSIS — M25612 Stiffness of left shoulder, not elsewhere classified: Secondary | ICD-10-CM | POA: Diagnosis not present

## 2014-10-24 LAB — PULMONARY FUNCTION TEST
DL/VA % PRED: 102 %
DL/VA: 5.04 ml/min/mmHg/L
DLCO unc % pred: 74 %
DLCO unc: 19.09 ml/min/mmHg
FEF 25-75 POST: 2.28 L/s
FEF 25-75 Pre: 3.03 L/sec
FEF2575-%Change-Post: -24 %
FEF2575-%Pred-Post: 121 %
FEF2575-%Pred-Pre: 161 %
FEV1-%CHANGE-POST: -1 %
FEV1-%Pred-Post: 100 %
FEV1-%Pred-Pre: 102 %
FEV1-Post: 2.02 L
FEV1-Pre: 2.05 L
FEV1FVC-%Change-Post: 1 %
FEV1FVC-%Pred-Pre: 112 %
FEV6-%CHANGE-POST: -3 %
FEV6-%PRED-POST: 90 %
FEV6-%Pred-Pre: 93 %
FEV6-Post: 2.26 L
FEV6-Pre: 2.34 L
FEV6FVC-%PRED-PRE: 103 %
FEV6FVC-%Pred-Post: 103 %
FVC-%Change-Post: -3 %
FVC-%PRED-PRE: 90 %
FVC-%Pred-Post: 87 %
FVC-PRE: 2.34 L
FVC-Post: 2.26 L
POST FEV6/FVC RATIO: 100 %
Post FEV1/FVC ratio: 89 %
Pre FEV1/FVC ratio: 88 %
Pre FEV6/FVC Ratio: 100 %
RV % PRED: 91 %
RV: 1.98 L
TLC % PRED: 83 %
TLC: 4.33 L

## 2014-10-24 MED ORDER — NICOTINE 14 MG/24HR TD PT24: 14.0000 mg | MEDICATED_PATCH | TRANSDERMAL | Status: DC

## 2014-10-24 NOTE — Progress Notes (Signed)
MRN# [id number] Kim Harding 09-26-1945   CC: Chief Complaint  Patient presents with  . Follow-up    COPD; SOB same, no cough, no chest tightness      Brief History: Synopsis: 69 year old female past medical history of obstructive sleep apnea, on CPAP, COPD, hypertension, transitioning care to pulmonary after recently moving from [place]. Has a history of mild COPD, with mild dyspnea on exertion that is now improving.  History of present illness February 2016: Patient is a pleasant 69 year old female is referred for further evaluation of COPD. Patient states that she recently moved to [facility], 05/2014, recently moved from [place], was seeing a pulmonologist there; told that she had mild copd, and placed placed on PRN albuterol. She also had a complaint of dyspnea on exertion which prompted the visit to see pulmonary today. The skin exertion history as stated below.  Currently attending the [facility], going 3 days a week, and participating their senior citizen cardio program.   DOE - baseline for 2-3 years, mostly with stair climbing, can climb about 1 flight stairs, can walk about 50 yards.  OSA - on cpap for the past 3 years, DME - [employer], wearing cpap machine nightly (4-6 hrs).  Had split study done in [place] 2015.    Smoking Hx - currently smoking 4-6 cigs per day - 1ppd x 50 years - quit for 1.5 years, but restarted 07/2002  Current COPD med - PRN albuterol inhaler Prior to PNA immunization, patient stated that she got a yearly URI sometimes requiring steroids and antibiotics, has not had an episode of this in the last 5 years.   No pets, worked in administration mostly. Allergies act up in the Spring - congestion, sneezing, runny nose, watery eyes.    - Spring 2015 required antibiotics and steroids due to bad allergy season causing sinusitis  Patient states that she's not using albuterol inhaler on a regular basis, maybe once a month if she really exerts himself. She is currently  still smoking but is actively trying to quit. Today she denies any cough, worsening shortness of breath, sputum production, fever, night sweats, chills, rapid weight loss. She also carries a diagnosis of sleep apnea, currently does not have a sleep specialist that she follows with. Plan: COPD optimization, exercise program, tobacco avoidance, continue with CPAP  Events since last clinic visit: Had Left rotator cuff surgery repair on 09/20/14, currently in physical therapy. No significant worsening of DOE, still smoking (about 1/4 ppd). Patient actually states that DOE is improving.  Had pfts and done today, would like to discuss the results.  Patient does have a history of obstructive sleep apnea currently on CPAP, currently awaiting a new mask from her DME [employer]) once her account transfers from [place] to [id number].   PMHX:   Past Medical History  Diagnosis Date  . Chicken pox   . Arthritis   . Allergy   . Hypertension   . Hyperlipidemia    Surgical Hx:  Past Surgical History  Procedure Laterality Date  . Cholecystectomy  2012  . Tonsillectomy  1960  . Abdominal hysterectomy  1994    partial  . Replacement total knee bilateral    . Bariatric surgery      sleeve--2014   Family Hx:  Family History  Problem Relation Age of Onset  . Heart disease Mother   . Arthritis Father   . Cancer Father     Prostate  . Diabetes Sister   . Diabetes Brother  Social Hx:   History  Substance Use Topics  . Smoking status: Current Every Day Smoker -- 0.33 packs/day for 50 years    Types: Cigarettes  . Smokeless tobacco: Never Used  . Alcohol Use: No   Medication:   Current Outpatient Rx  Name  Route  Sig  Dispense  Refill  . albuterol (PROVENTIL HFA;VENTOLIN HFA) 108 (90 BASE) MCG/ACT inhaler   Inhalation   Inhale 2 puffs into the lungs every 6 (six) hours as needed for wheezing or shortness of breath.   6.7 Inhaler   3   . Cholecalciferol (VITAMIN D-3) 1000 UNITS CAPS   Oral   Take  1 capsule by mouth daily.         Marland Kitchen. esomeprazole (NEXIUM) 40 MG capsule   Oral   Take 1 capsule (40 mg total) by mouth daily at 12 noon.   90 capsule   3   . hydrochlorothiazide (HYDRODIURIL) 12.5 MG tablet   Oral   Take 1 tablet (12.5 mg total) by mouth daily.   90 tablet   1   . ibuprofen (ADVIL,MOTRIN) 800 MG tablet   Oral   Take 1 tablet (800 mg total) by mouth every 8 (eight) hours as needed.   60 tablet   2   . metFORMIN (GLUCOPHAGE) 500 MG tablet   Oral   Take 1 tablet (500 mg total) by mouth 2 (two) times daily with a meal.   60 tablet   2   . metoprolol succinate (TOPROL-XL) 100 MG 24 hr tablet   Oral   Take 1 tablet (100 mg total) by mouth daily. Take with or immediately following a meal.   90 tablet   1   . potassium chloride SA (KLOR-CON M20) 20 MEQ tablet   Oral   Take 1 tablet (20 mEq total) by mouth daily.   90 tablet   0   . rosuvastatin (CRESTOR) 10 MG tablet   Oral   Take 1 tablet (10 mg total) by mouth daily.   90 tablet   1      Review of Systems: Gen:  Denies  fever, sweats, chills HEENT: Denies blurred vision, double vision, ear pain, eye pain, hearing loss, nose bleeds, sore throat Cvc:  No dizziness, chest pain or heaviness Resp:   Denies cough or sputum porduction, shortness of breath Gi: Denies swallowing difficulty, stomach pain, nausea or vomiting, diarrhea, constipation, bowel incontinence Gu:  Denies bladder incontinence, burning urine Ext:   No Joint pain, stiffness or swelling Skin: No skin rash, easy bruising or bleeding or hives Endoc:  No polyuria, polydipsia , polyphagia or weight change Psych: No depression, insomnia or hallucinations  Other:  All other systems negative  Allergies:  Aspirin; Darvon; Latex; and Naproxen  Physical Examination:  VS: BP 128/66 mmHg  Pulse 94  Ht 5\' 5"  (1.651 m)  Wt 243 lb (110.224 kg)  BMI 40.44 kg/m2  SpO2 98%  General Appearance: No distress  HEENT: PERRLA, EOM intact, no  ptosis, no other lesions noticed Pulmonary:Exam: normal breath sounds., diaphragmatic excursion normal.No wheezing, No rales   Cardiovascular:@ Exam:  Normal S1,S2.  No m/r/g.     Abdomen:Exam: Benign, Soft, non-tender, No masses  Skin:   warm, no rashes, no ecchymosis  Extremities: normal, no cyanosis, clubbing, no edema, warm with normal capillary refill.   Labs results:  BMP Lab Results  Component Value Date   NA 141 08/09/2014   K 4.2 08/09/2014   CL 106  08/09/2014   CO2 33* 08/09/2014   GLUCOSE 144* 08/09/2014   BUN 11 08/09/2014   CREATININE 0.78 08/09/2014     CBC CBC Latest Ref Rng 08/09/2014  WBC 4.0 - 10.5 K/uL 11.0(H)  Hemoglobin 12.0 - 15.0 g/dL 75.6  Hematocrit 43.3 - 46.0 % 43.1  Platelets 150.0 - 400.0 K/uL 287.0     Rad results: (The following images and results were reviewed by Dr. Dema Severin). CXR 08/2014 EXAM: CHEST 2 VIEW  COMPARISON: None.  FINDINGS: Cardiac shadow is within normal limits. The lungs are well aerated bilaterally. Mild interstitial changes are seen without focal infiltrate. No effusion or pneumothorax is noted.  IMPRESSION: No active cardiopulmonary disease.   Pulmonary function testing 10/23/2013 FEV1 102 FEV1/FVC 88 FVC 90, 2.34 RV 91 TLC 83 RV/TLC 108 ERV 81 DLCO 74 Interpretation normal PFTs mild decrease in DLCO, this can be seen in smokers.  Assessment and Plan: 69 year old female past medical history of obstructive sleep apnea, COPD, seen for followup visit today. COPD, mild COPD-mild type CAT score 5, mMRC score 1  a type A COPD who only requires bronchodilator when needed, does not need to be on maintenance bronchodilator therapy.  Also, has normal urinary function testing, and walked more thana 1000 feet on her 6 minute walk test without any significant dyspnea Patient is also up-to-date on all immunizations and vaccinations.  Plan: -Patient educated on the use of albuterol inhaler and advised to use only as a  rescue medication for shortness of breath/wheezing/cough. -Tobacco Cessation will be paramount in alleviating some of the symptoms she is having related to her COPD. -Continue exercising program at [facility] as tolerated   OSA on CPAP Diagnosed in [place] 3 years ago, currently on CPAP wearing nightly. Currently awaiting records, continue with current CPAP usage. Address following with sleep specialist at next visit.   OSA Encouraged proper weight management.  Excessive weight may contribute to snoring.  Monitor sedative use.  Discussed driving precautions and its relationship with hypersomnolence.  Discussed operating dangerous equipment and its relationship with hypersomnolence.  Discussed sleep hygiene, and benefits of a fixed sleep waked time.  The importance of getting eight or more hours of sleep discussed with patient.  Discussed limiting the use of the computer and television before bedtime.  Decrease naps during the day, so night time sleep will become enhanced.  Limit caffeine, and sleep deprivation.  HTN, stroke, and heart failure are potential risk factors.    Plan: -Awaiting records from patient, currently her DME account is being transferred from [place] to [facility], once this occurs she will be approved for a new mask.      Tobacco abuse Tobacco Cessation - Counseling regarding benefits of smoking cessation strategies was provided for more than 12 min. - Educated that at this time smoking- cessation represents the single most important step that patient can take to enhance the length and quality of live. - Educated patient regarding alternatives of behavior interventions, pharmacotherapy including NRT and non-nicotine therapy such, and combinations of both. - Patient at this time:is willing to try nicotine patches, nicotine 14 mcg prescription given to the patient - Quit date:12/21/2014       DOE (dyspnea on exertion) Multifactorial: Tobacco abuse,  COPD, obesity, deconditioning.  Patient states that her dyspnea on exertion is actually improving, and is not as bothersome as it has been in the past couple months.  Plan: -COPD optimization as stated -Tobacco Cessation -continue exercise program -Diet and weight loss.  Updated Medication List Outpatient Encounter Prescriptions as of 10/24/2014  Medication Sig  . albuterol (PROVENTIL HFA;VENTOLIN HFA) 108 (90 BASE) MCG/ACT inhaler Inhale 2 puffs into the lungs every 6 (six) hours as needed for wheezing or shortness of breath.  . Cholecalciferol (VITAMIN D-3) 1000 UNITS CAPS Take 1 capsule by mouth daily.  Marland Kitchen esomeprazole (NEXIUM) 40 MG capsule Take 1 capsule (40 mg total) by mouth daily at 12 noon.  . hydrochlorothiazide (HYDRODIURIL) 12.5 MG tablet Take 1 tablet (12.5 mg total) by mouth daily.  Marland Kitchen ibuprofen (ADVIL,MOTRIN) 800 MG tablet Take 1 tablet (800 mg total) by mouth every 8 (eight) hours as needed.  . metFORMIN (GLUCOPHAGE) 500 MG tablet Take 1 tablet (500 mg total) by mouth 2 (two) times daily with a meal.  . metoprolol succinate (TOPROL-XL) 100 MG 24 hr tablet Take 1 tablet (100 mg total) by mouth daily. Take with or immediately following a meal.  . potassium chloride SA (KLOR-CON M20) 20 MEQ tablet Take 1 tablet (20 mEq total) by mouth daily.  . rosuvastatin (CRESTOR) 10 MG tablet Take 1 tablet (10 mg total) by mouth daily.    Orders for this visit: No orders of the defined types were placed in this encounter.    Thank  you for the visitation and for allowing  [facility] to assist in the care of your patient. Our recommendations are noted above.  Please contact us if we can be of further service.  Stephanie Acre, MD [facility] Office Number: [phone]

## 2014-10-24 NOTE — Patient Instructions (Signed)
Follow up with Dr. Dema SeverinMungal in 3 months - nicotine patch 14mcg, 1 patch per day to the arm, alternate sites - tobacco cessation as discussed today - continue with exercise

## 2014-10-24 NOTE — Progress Notes (Signed)
PFT performed today. 

## 2014-10-24 NOTE — Progress Notes (Signed)
SMW performed today. 

## 2014-10-24 NOTE — Telephone Encounter (Signed)
Sent request to [employer] [employer] to get most recent download on patient. Pt does not have a [place] account with them. She got her machine in 2007. She will need to send her card into DME for download. Pt was informed during her office visit today.

## 2014-10-25 DIAGNOSIS — S43422D Sprain of left rotator cuff capsule, subsequent encounter: Secondary | ICD-10-CM | POA: Diagnosis not present

## 2014-10-25 DIAGNOSIS — M25512 Pain in left shoulder: Secondary | ICD-10-CM | POA: Diagnosis not present

## 2014-10-25 DIAGNOSIS — M25612 Stiffness of left shoulder, not elsewhere classified: Secondary | ICD-10-CM | POA: Diagnosis not present

## 2014-10-25 NOTE — Assessment & Plan Note (Signed)
Tobacco Cessation - Counseling regarding benefits of smoking cessation strategies was provided for more than 12 min. - Educated that at this time smoking- cessation represents the single most important step that patient can take to enhance the length and quality of live. - Educated patient regarding alternatives of behavior interventions, pharmacotherapy including NRT and non-nicotine therapy such, and combinations of both. - Patient at this time:is willing to try nicotine patches, nicotine 14 mcg prescription given to the patient - Quit date:12/21/2014

## 2014-10-25 NOTE — Assessment & Plan Note (Addendum)
COPD-mild type CAT score 5, mMRC score 1  a type A COPD who only requires bronchodilator when needed, does not need to be on maintenance bronchodilator therapy.  Also, has normal urinary function testing, and walked more thana 1000 feet on her 6 minute walk test without any significant dyspnea Patient is also up-to-date on all immunizations and vaccinations.  Plan: -Patient educated on the use of albuterol inhaler and advised to use only as a rescue medication for shortness of breath/wheezing/cough. -Tobacco Cessation will be paramount in alleviating some of the symptoms she is having related to her COPD. -Continue exercising program at [employer] as tolerated   

## 2014-10-25 NOTE — Assessment & Plan Note (Signed)
Multifactorial: Tobacco abuse, COPD, obesity, deconditioning.  Patient states that her dyspnea on exertion is actually improving, and is not as bothersome as it has been in the past couple months.  Plan: -COPD optimization as stated -Tobacco Cessation -continue exercise program -Diet and weight loss.

## 2014-10-25 NOTE — Assessment & Plan Note (Signed)
Diagnosed in [place] 3 years ago, currently on CPAP wearing nightly. Currently awaiting records, continue with current CPAP usage. Address following with sleep specialist at next visit.   OSA Encouraged proper weight management.  Excessive weight may contribute to snoring.  Monitor sedative use.  Discussed driving precautions and its relationship with hypersomnolence.  Discussed operating dangerous equipment and its relationship with hypersomnolence.  Discussed sleep hygiene, and benefits of a fixed sleep waked time.  The importance of getting eight or more hours of sleep discussed with patient.  Discussed limiting the use of the computer and television before bedtime.  Decrease naps during the day, so night time sleep will become enhanced.  Limit caffeine, and sleep deprivation.  HTN, stroke, and heart failure are potential risk factors.    Plan: -Awaiting records from patient, currently her DME [facility] is being [facility] [place] [facility], once this occurs she will be approved for a new mask.

## 2014-11-02 DIAGNOSIS — M25612 Stiffness of left shoulder, not elsewhere classified: Secondary | ICD-10-CM | POA: Diagnosis not present

## 2014-11-02 DIAGNOSIS — S43422D Sprain of left rotator cuff capsule, subsequent encounter: Secondary | ICD-10-CM | POA: Diagnosis not present

## 2014-11-02 DIAGNOSIS — M25512 Pain in left shoulder: Secondary | ICD-10-CM | POA: Diagnosis not present

## 2014-11-05 ENCOUNTER — Other Ambulatory Visit: Payer: Self-pay | Admitting: Internal Medicine

## 2014-11-09 DIAGNOSIS — M25612 Stiffness of left shoulder, not elsewhere classified: Secondary | ICD-10-CM | POA: Diagnosis not present

## 2014-11-09 DIAGNOSIS — M25512 Pain in left shoulder: Secondary | ICD-10-CM | POA: Diagnosis not present

## 2014-11-09 DIAGNOSIS — S43422D Sprain of left rotator cuff capsule, subsequent encounter: Secondary | ICD-10-CM | POA: Diagnosis not present

## 2014-11-11 ENCOUNTER — Other Ambulatory Visit: Payer: Self-pay | Admitting: Internal Medicine

## 2014-11-14 DIAGNOSIS — S43422D Sprain of left rotator cuff capsule, subsequent encounter: Secondary | ICD-10-CM | POA: Diagnosis not present

## 2014-11-14 DIAGNOSIS — M25512 Pain in left shoulder: Secondary | ICD-10-CM | POA: Diagnosis not present

## 2014-11-14 DIAGNOSIS — M25612 Stiffness of left shoulder, not elsewhere classified: Secondary | ICD-10-CM | POA: Diagnosis not present

## 2014-11-16 ENCOUNTER — Encounter: Payer: Self-pay | Admitting: Internal Medicine

## 2014-11-16 DIAGNOSIS — M25612 Stiffness of left shoulder, not elsewhere classified: Secondary | ICD-10-CM | POA: Diagnosis not present

## 2014-11-16 DIAGNOSIS — M25512 Pain in left shoulder: Secondary | ICD-10-CM | POA: Diagnosis not present

## 2014-11-16 DIAGNOSIS — S43422D Sprain of left rotator cuff capsule, subsequent encounter: Secondary | ICD-10-CM | POA: Diagnosis not present

## 2014-11-22 DIAGNOSIS — S43422D Sprain of left rotator cuff capsule, subsequent encounter: Secondary | ICD-10-CM | POA: Diagnosis not present

## 2014-11-22 DIAGNOSIS — M25612 Stiffness of left shoulder, not elsewhere classified: Secondary | ICD-10-CM | POA: Diagnosis not present

## 2014-11-22 DIAGNOSIS — M25512 Pain in left shoulder: Secondary | ICD-10-CM | POA: Diagnosis not present

## 2014-11-23 DIAGNOSIS — M25512 Pain in left shoulder: Secondary | ICD-10-CM | POA: Diagnosis not present

## 2014-11-23 DIAGNOSIS — S43422D Sprain of left rotator cuff capsule, subsequent encounter: Secondary | ICD-10-CM | POA: Diagnosis not present

## 2014-11-23 DIAGNOSIS — M25612 Stiffness of left shoulder, not elsewhere classified: Secondary | ICD-10-CM | POA: Diagnosis not present

## 2014-11-28 DIAGNOSIS — M25612 Stiffness of left shoulder, not elsewhere classified: Secondary | ICD-10-CM | POA: Diagnosis not present

## 2014-11-28 DIAGNOSIS — S43422D Sprain of left rotator cuff capsule, subsequent encounter: Secondary | ICD-10-CM | POA: Diagnosis not present

## 2014-11-28 DIAGNOSIS — M25512 Pain in left shoulder: Secondary | ICD-10-CM | POA: Diagnosis not present

## 2014-11-30 DIAGNOSIS — M25612 Stiffness of left shoulder, not elsewhere classified: Secondary | ICD-10-CM | POA: Diagnosis not present

## 2014-11-30 DIAGNOSIS — M25512 Pain in left shoulder: Secondary | ICD-10-CM | POA: Diagnosis not present

## 2014-11-30 DIAGNOSIS — Z9889 Other specified postprocedural states: Secondary | ICD-10-CM | POA: Diagnosis not present

## 2014-11-30 DIAGNOSIS — S43422D Sprain of left rotator cuff capsule, subsequent encounter: Secondary | ICD-10-CM | POA: Diagnosis not present

## 2014-12-05 DIAGNOSIS — M25512 Pain in left shoulder: Secondary | ICD-10-CM | POA: Diagnosis not present

## 2014-12-05 DIAGNOSIS — M25612 Stiffness of left shoulder, not elsewhere classified: Secondary | ICD-10-CM | POA: Diagnosis not present

## 2014-12-05 DIAGNOSIS — S43422D Sprain of left rotator cuff capsule, subsequent encounter: Secondary | ICD-10-CM | POA: Diagnosis not present

## 2014-12-07 DIAGNOSIS — M25512 Pain in left shoulder: Secondary | ICD-10-CM | POA: Diagnosis not present

## 2014-12-07 DIAGNOSIS — M25612 Stiffness of left shoulder, not elsewhere classified: Secondary | ICD-10-CM | POA: Diagnosis not present

## 2014-12-07 DIAGNOSIS — S43422D Sprain of left rotator cuff capsule, subsequent encounter: Secondary | ICD-10-CM | POA: Diagnosis not present

## 2014-12-12 DIAGNOSIS — M25512 Pain in left shoulder: Secondary | ICD-10-CM | POA: Diagnosis not present

## 2014-12-12 DIAGNOSIS — S43422D Sprain of left rotator cuff capsule, subsequent encounter: Secondary | ICD-10-CM | POA: Diagnosis not present

## 2014-12-12 DIAGNOSIS — M25612 Stiffness of left shoulder, not elsewhere classified: Secondary | ICD-10-CM | POA: Diagnosis not present

## 2014-12-14 DIAGNOSIS — M25512 Pain in left shoulder: Secondary | ICD-10-CM | POA: Diagnosis not present

## 2014-12-14 DIAGNOSIS — S43422D Sprain of left rotator cuff capsule, subsequent encounter: Secondary | ICD-10-CM | POA: Diagnosis not present

## 2014-12-14 DIAGNOSIS — M25612 Stiffness of left shoulder, not elsewhere classified: Secondary | ICD-10-CM | POA: Diagnosis not present

## 2014-12-20 ENCOUNTER — Telehealth: Payer: Self-pay

## 2014-12-20 NOTE — Telephone Encounter (Signed)
Pt came to office after finding out at [facility] that generic crestor is no less expensive than when was name brand and pt [facility]. [facility]. Pt will wait until her 01/2015 appt and then get all meds sent to mail order pharmacy.

## 2014-12-21 DIAGNOSIS — S43422D Sprain of left rotator cuff capsule, subsequent encounter: Secondary | ICD-10-CM | POA: Diagnosis not present

## 2014-12-21 DIAGNOSIS — M25612 Stiffness of left shoulder, not elsewhere classified: Secondary | ICD-10-CM | POA: Diagnosis not present

## 2014-12-21 DIAGNOSIS — M25512 Pain in left shoulder: Secondary | ICD-10-CM | POA: Diagnosis not present

## 2014-12-22 ENCOUNTER — Telehealth: Payer: Self-pay | Admitting: *Deleted

## 2014-12-22 NOTE — Telephone Encounter (Signed)
Left message on machine to ask patient to return call to make sure her mammogram is scheduled. 

## 2014-12-23 DIAGNOSIS — M25512 Pain in left shoulder: Secondary | ICD-10-CM | POA: Diagnosis not present

## 2014-12-23 DIAGNOSIS — M25612 Stiffness of left shoulder, not elsewhere classified: Secondary | ICD-10-CM | POA: Diagnosis not present

## 2014-12-23 DIAGNOSIS — S43422D Sprain of left rotator cuff capsule, subsequent encounter: Secondary | ICD-10-CM | POA: Diagnosis not present

## 2014-12-26 DIAGNOSIS — S43422D Sprain of left rotator cuff capsule, subsequent encounter: Secondary | ICD-10-CM | POA: Diagnosis not present

## 2014-12-26 DIAGNOSIS — M25612 Stiffness of left shoulder, not elsewhere classified: Secondary | ICD-10-CM | POA: Diagnosis not present

## 2014-12-26 DIAGNOSIS — M25512 Pain in left shoulder: Secondary | ICD-10-CM | POA: Diagnosis not present

## 2014-12-28 DIAGNOSIS — M25512 Pain in left shoulder: Secondary | ICD-10-CM | POA: Diagnosis not present

## 2014-12-28 DIAGNOSIS — M25612 Stiffness of left shoulder, not elsewhere classified: Secondary | ICD-10-CM | POA: Diagnosis not present

## 2014-12-28 DIAGNOSIS — S43422D Sprain of left rotator cuff capsule, subsequent encounter: Secondary | ICD-10-CM | POA: Diagnosis not present

## 2015-01-06 DIAGNOSIS — J209 Acute bronchitis, unspecified: Secondary | ICD-10-CM | POA: Diagnosis not present

## 2015-01-11 DIAGNOSIS — Z09 Encounter for follow-up examination after completed treatment for conditions other than malignant neoplasm: Secondary | ICD-10-CM | POA: Diagnosis not present

## 2015-01-11 DIAGNOSIS — S43422D Sprain of left rotator cuff capsule, subsequent encounter: Secondary | ICD-10-CM | POA: Diagnosis not present

## 2015-01-11 DIAGNOSIS — M25612 Stiffness of left shoulder, not elsewhere classified: Secondary | ICD-10-CM | POA: Diagnosis not present

## 2015-01-11 DIAGNOSIS — M25512 Pain in left shoulder: Secondary | ICD-10-CM | POA: Diagnosis not present

## 2015-01-13 DIAGNOSIS — M25612 Stiffness of left shoulder, not elsewhere classified: Secondary | ICD-10-CM | POA: Diagnosis not present

## 2015-01-13 DIAGNOSIS — S43422D Sprain of left rotator cuff capsule, subsequent encounter: Secondary | ICD-10-CM | POA: Diagnosis not present

## 2015-01-13 DIAGNOSIS — M25512 Pain in left shoulder: Secondary | ICD-10-CM | POA: Diagnosis not present

## 2015-01-19 ENCOUNTER — Ambulatory Visit (INDEPENDENT_AMBULATORY_CARE_PROVIDER_SITE_OTHER): Payer: BLUE CROSS/BLUE SHIELD | Admitting: Internal Medicine

## 2015-01-19 ENCOUNTER — Encounter: Payer: Self-pay | Admitting: Internal Medicine

## 2015-01-19 DIAGNOSIS — J449 Chronic obstructive pulmonary disease, unspecified: Secondary | ICD-10-CM

## 2015-01-19 DIAGNOSIS — Z720 Tobacco use: Secondary | ICD-10-CM

## 2015-01-19 DIAGNOSIS — G4733 Obstructive sleep apnea (adult) (pediatric): Secondary | ICD-10-CM | POA: Diagnosis not present

## 2015-01-19 DIAGNOSIS — Z9989 Dependence on other enabling machines and devices: Principal | ICD-10-CM

## 2015-01-19 NOTE — Assessment & Plan Note (Signed)
Tobacco Cessation - Counseling regarding benefits of smoking cessation strategies was provided for more than 12 min. - Educated that at this time smoking- cessation represents the single most important step that patient can take to enhance the length and quality of live. - Educated patient regarding alternatives of behavior interventions, pharmacotherapy including NRT and non-nicotine therapy such, and combinations of both. - Patient at this time down to smoking 2 cigarettes, nicotine patches were not working for her, currently she is trying nicotine lozenges.

## 2015-01-19 NOTE — Patient Instructions (Addendum)
Follow up with Dr. Dema SeverinMungal in 6 months - cont with as needed albuterol  - cont with wearing your cpap night - cont with your current amoxicillin regiment.  - avoid tobacco  - [facility], if desired

## 2015-01-19 NOTE — Assessment & Plan Note (Signed)
Diagnosed in [place] 3 years ago, currently on CPAP wearing nightly. Per records, CPAP 12 cm of water   OSA Encouraged proper weight management.  Excessive weight may contribute to snoring.  Monitor sedative use.  Discussed driving precautions and its relationship with hypersomnolence.  Discussed operating dangerous equipment and its relationship with hypersomnolence.  Discussed sleep hygiene, and benefits of a fixed sleep waked time.  The importance of getting eight or more hours of sleep discussed with patient.  Discussed limiting the use of the computer and television before bedtime.  Decrease naps during the day, so night time sleep will become enhanced.  Limit caffeine, and sleep deprivation.  HTN, stroke, and heart failure are potential risk factors.    Plan: - Continue with CPAP nightly

## 2015-01-19 NOTE — Assessment & Plan Note (Signed)
COPD-mild type CAT score 5, mMRC score 1  a type A COPD who only requires bronchodilator when needed, does not need to be on maintenance bronchodilator therapy.  Also, has normal urinary function testing, and walked more thana 1000 feet on her 6 minute walk test without any significant dyspnea Patient is also up-to-date on all immunizations and vaccinations.  Plan: -Patient educated on the use of albuterol inhaler and advised to use only as a rescue medication for shortness of breath/wheezing/cough. -Tobacco Cessation will be paramount in alleviating some of the symptoms she is having related to her COPD. -[facility] as tolerated

## 2015-01-19 NOTE — Progress Notes (Signed)
MRN# [id number] Kim Harding [birth year]   CC: Chief Complaint  Patient presents with  . Follow-up    Pt d/c nicotine patch and is now chewing the gum; Pt recently had tooth extraction and sinus infection.      Brief History: Synopsis: 69 year old female past medical history of obstructive sleep apnea, on CPAP, COPD, hypertension, transitioning care to pulmonary after recently moving from [place]. Has a history of mild COPD, with mild dyspnea on exertion that is now improving.  History of present illness February 2016: Patient is a pleasant 69 year old female is referred for further evaluation of COPD. Patient states that she recently moved to [facility], 05/2014, recently moved from [place], was seeing a pulmonologist there; told that she had mild copd, and placed placed on PRN albuterol. She also had a complaint of dyspnea on exertion which prompted the visit to see pulmonary today. The skin exertion history as stated below.  Currently attending the [facility], going 3 days a week, and participating their senior citizen cardio program.   DOE - baseline for 2-3 years, mostly with stair climbing, can climb about 1 flight stairs, can walk about 50 yards.  OSA - on cpap for the past 3 years, DME - [employer], wearing cpap machine nightly (4-6 hrs). Had split study done in [place] 2015.   Smoking Hx - currently smoking 4-6 cigs per day - 1ppd x 50 years - quit for 1.5 years, but restarted 07/2002  Current COPD med - PRN albuterol inhaler Prior to PNA immunization, patient stated that she got a yearly URI sometimes requiring steroids and antibiotics, has not had an episode of this in the last 5 years.  No pets, worked in administration mostly. Allergies act up in the Spring - congestion, sneezing, runny nose, watery eyes.  - Spring 2015 required antibiotics and steroids due to bad allergy season causing sinusitis  Patient states that she's not using albuterol inhaler on a regular basis, maybe  once a month if she really exerts himself. She is currently still smoking but is actively trying to quit. Today she denies any cough, worsening shortness of breath, sputum production, fever, night sweats, chills, rapid weight loss. She also carries a diagnosis of sleep apnea, currently does not have a sleep specialist that she follows with. Plan: COPD optimization, exercise program, tobacco avoidance, continue with CPAP  ROV 10/24/14 Had Left rotator cuff surgery repair on 09/20/14, currently in physical therapy. No significant worsening of DOE, still smoking (about 1/4 ppd). Patient actually states that DOE is improving.  Had pfts and done today, would like to discuss the results.  Patient does have a history of obstructive sleep apnea currently on CPAP, currently awaiting a new mask from her DME Christoper Allegra) once her account transfers from [place] to [facility].  Plan: -COPD optimization as stated (PRN rescue inhaler) -Tobacco Cessation -continue exercise program -Diet and weight loss.  Events since last clinic visit: Recent sinusitis infection while visiting [place], went to urgent care got antibiotics and steroids. Recent dental surgery, on right lower jaw.  Previously using nicotine patches, they kept falling off, now using nicotine lozenges.  Smoking about 2 cigs per day.  Today doing well, no cough, no sob. Will finish antibiotics on 01/21/15.    Medication:   Current Outpatient Rx  Name  Route  Sig  Dispense  Refill  . albuterol (PROVENTIL HFA;VENTOLIN HFA) 108 (90 BASE) MCG/ACT inhaler   Inhalation   Inhale 2 puffs into the lungs every 6 (six) hours  as needed for wheezing or shortness of breath.   6.7 Inhaler   3   . Cholecalciferol (VITAMIN D-3) 1000 UNITS CAPS   Oral   Take 1 capsule by mouth daily.         . clindamycin (CLEOCIN) 300 MG capsule            0   . docusate sodium (COLACE) 100 MG capsule            0   . esomeprazole (NEXIUM) 40 MG capsule   Oral   Take 1  capsule (40 mg total) by mouth daily at 12 noon.   90 capsule   3   . hydrochlorothiazide (HYDRODIURIL) 12.5 MG tablet   Oral   Take 1 tablet (12.5 mg total) by mouth daily.   90 tablet   1   . ibuprofen (ADVIL,MOTRIN) 800 MG tablet   Oral   Take 1 tablet (800 mg total) by mouth every 8 (eight) hours as needed.   60 tablet   2   . KLOR-CON M20 20 MEQ tablet      TAKE 1 TABLET (20 MEQ TOTAL) BY MOUTH DAILY.   90 tablet   0   . metFORMIN (GLUCOPHAGE) 500 MG tablet      TAKE 1 TABLET (500 MG TOTAL) BY MOUTH 2 (TWO) TIMES DAILY WITH A MEAL.   180 tablet   1   . metoprolol succinate (TOPROL-XL) 100 MG 24 hr tablet   Oral   Take 1 tablet (100 mg total) by mouth daily. Take with or immediately following a meal.   90 tablet   1   . nicotine (NICODERM CQ - DOSED IN MG/24 HOURS) 14 mg/24hr patch   Transdermal   Place 1 patch (14 mg total) onto the skin daily. Alternate sites daily,   30 patch   2   . oxyCODONE-acetaminophen (PERCOCET/ROXICET) 5-325 MG per tablet   Oral   Take by mouth. for pain      0   . rosuvastatin (CRESTOR) 10 MG tablet   Oral   Take 1 tablet (10 mg total) by mouth daily.   90 tablet   1      Review of Systems: Gen:  Denies  fever, sweats, chills HEENT: Denies blurred vision, double vision, ear pain, eye pain, hearing loss, nose bleeds, sore throat Cvc:  No dizziness, chest pain or heaviness Resp:   Admits to: Gi: Denies swallowing difficulty, stomach pain, nausea or vomiting, diarrhea, constipation, bowel incontinence Gu:  Denies bladder incontinence, burning urine Ext:   No Joint pain, stiffness or swelling Skin: No skin rash, easy bruising or bleeding or hives Endoc:  No polyuria, polydipsia , polyphagia or weight change Other:  All other systems negative  Allergies:  Aspirin; Darvon; Latex; and Naproxen  Physical Examination:  VS: BP 120/62 mmHg  Pulse 67  Temp(Src) 99 F (37.2 C) (Oral)  Ht 5\' 5"  (1.651 m)  Wt 234 lb (106.142  kg)  BMI 38.94 kg/m2  SpO2 99%  General Appearance: No distress  HEENT: PERRLA, no ptosis, no other lesions noticed Pulmonary:normal breath sounds., diaphragmatic excursion normal.No wheezing, No rales   Cardiovascular:  Normal S1,S2.  No m/r/g.     Abdomen:Exam: Benign, Soft, non-tender, No masses  Skin:   warm, no rashes, no ecchymosis  Extremities: normal, no cyanosis, clubbing, warm with normal capillary refill.     Assessment and Plan: 69 year old female seen in follow-up visit for mild COPD COPD, mild COPD-mild  type CAT score 5, mMRC score 1  a type A COPD who only requires bronchodilator when needed, does not need to be on maintenance bronchodilator therapy.  Also, has normal urinary function testing, and walked more thana 1000 feet on her 6 minute walk test without any significant dyspnea Patient is also up-to-date on all immunizations and vaccinations.  Plan: -Patient educated on the use of albuterol inhaler and advised to use only as a rescue medication for shortness of breath/wheezing/cough. -Tobacco Cessation will be paramount in alleviating some of the symptoms she is having related to her COPD. -Continue [facility] as tolerated    OSA on CPAP Diagnosed in [place] 3 years ago, currently on CPAP wearing nightly. Per records, CPAP 12 cm of water   OSA Encouraged proper weight management.  Excessive weight may contribute to snoring.  Monitor sedative use.  Discussed driving precautions and its relationship with hypersomnolence.  Discussed operating dangerous equipment and its relationship with hypersomnolence.  Discussed sleep hygiene, and benefits of a fixed sleep waked time.  The importance of getting eight or more hours of sleep discussed with patient.  Discussed limiting the use of the computer and television before bedtime.  Decrease naps during the day, so night time sleep will become enhanced.  Limit caffeine, and sleep deprivation.  HTN,  stroke, and heart failure are potential risk factors.    Plan: - Continue with CPAP nightly      Tobacco abuse Tobacco Cessation - Counseling regarding benefits of smoking cessation strategies was provided for more than 12 min. - Educated that at this time smoking- cessation represents the single most important step that patient can take to enhance the length and quality of live. - Educated patient regarding alternatives of behavior interventions, pharmacotherapy including NRT and non-nicotine therapy such, and combinations of both. - Patient at this time down to smoking 2 cigarettes, nicotine patches were not working for her, currently she is trying nicotine lozenges.          Updated Medication List Outpatient Encounter Prescriptions as of 01/19/2015  Medication Sig  . albuterol (PROVENTIL HFA;VENTOLIN HFA) 108 (90 BASE) MCG/ACT inhaler Inhale 2 puffs into the lungs every 6 (six) hours as needed for wheezing or shortness of breath.  . Cholecalciferol (VITAMIN D-3) 1000 UNITS CAPS Take 1 capsule by mouth daily.  . clindamycin (CLEOCIN) 300 MG capsule   . docusate sodium (COLACE) 100 MG capsule   . esomeprazole (NEXIUM) 40 MG capsule Take 1 capsule (40 mg total) by mouth daily at 12 noon.  . hydrochlorothiazide (HYDRODIURIL) 12.5 MG tablet Take 1 tablet (12.5 mg total) by mouth daily.  Marland Kitchen. ibuprofen (ADVIL,MOTRIN) 800 MG tablet Take 1 tablet (800 mg total) by mouth every 8 (eight) hours as needed.  Marland Kitchen. KLOR-CON M20 20 MEQ tablet TAKE 1 TABLET (20 MEQ TOTAL) BY MOUTH DAILY.  . metFORMIN (GLUCOPHAGE) 500 MG tablet TAKE 1 TABLET (500 MG TOTAL) BY MOUTH 2 (TWO) TIMES DAILY WITH A MEAL.  . metoprolol succinate (TOPROL-XL) 100 MG 24 hr tablet Take 1 tablet (100 mg total) by mouth daily. Take with or immediately following a meal.  . nicotine (NICODERM CQ - DOSED IN MG/24 HOURS) 14 mg/24hr patch Place 1 patch (14 mg total) onto the skin daily. Alternate sites daily,  .  oxyCODONE-acetaminophen (PERCOCET/ROXICET) 5-325 MG per tablet Take by mouth. for pain  . rosuvastatin (CRESTOR) 10 MG tablet Take 1 tablet (10 mg total) by mouth daily.   No facility-administered encounter  medications on file as of 01/19/2015.    Orders for this visit: Orders Placed This Encounter  Procedures  . Ambulatory Referral for DME    Referral Priority:  Routine    Referral Type:  Durable Medical Equipment Purchase    Number of Visits Requested:  1    Thank  you for the visitation and for allowing  [facility] to assist in the care of your patient. Our recommendations are noted above.  Please contact us if we can be of further service.  Stephanie Acre, MD [facility] Office Number: [phone]

## 2015-01-26 ENCOUNTER — Telehealth: Payer: Self-pay | Admitting: Internal Medicine

## 2015-01-26 NOTE — Telephone Encounter (Signed)
Rec'd from [facility] forward 7 pages to Dr.Mungal

## 2015-02-07 ENCOUNTER — Ambulatory Visit (INDEPENDENT_AMBULATORY_CARE_PROVIDER_SITE_OTHER): Payer: Medicare Other | Admitting: Internal Medicine

## 2015-02-07 ENCOUNTER — Encounter: Payer: Self-pay | Admitting: Internal Medicine

## 2015-02-07 VITALS — BP 130/86 | HR 73 | Temp 98.3°F | Wt 236.0 lb

## 2015-02-07 DIAGNOSIS — G4733 Obstructive sleep apnea (adult) (pediatric): Secondary | ICD-10-CM | POA: Diagnosis not present

## 2015-02-07 DIAGNOSIS — M150 Primary generalized (osteo)arthritis: Secondary | ICD-10-CM

## 2015-02-07 DIAGNOSIS — Z9989 Dependence on other enabling machines and devices: Secondary | ICD-10-CM

## 2015-02-07 DIAGNOSIS — K219 Gastro-esophageal reflux disease without esophagitis: Secondary | ICD-10-CM

## 2015-02-07 DIAGNOSIS — B3731 Acute candidiasis of vulva and vagina: Secondary | ICD-10-CM

## 2015-02-07 DIAGNOSIS — I10 Essential (primary) hypertension: Secondary | ICD-10-CM

## 2015-02-07 DIAGNOSIS — M159 Polyosteoarthritis, unspecified: Secondary | ICD-10-CM

## 2015-02-07 DIAGNOSIS — J449 Chronic obstructive pulmonary disease, unspecified: Secondary | ICD-10-CM

## 2015-02-07 DIAGNOSIS — E119 Type 2 diabetes mellitus without complications: Secondary | ICD-10-CM | POA: Diagnosis not present

## 2015-02-07 DIAGNOSIS — E785 Hyperlipidemia, unspecified: Secondary | ICD-10-CM

## 2015-02-07 DIAGNOSIS — Z720 Tobacco use: Secondary | ICD-10-CM

## 2015-02-07 DIAGNOSIS — M75102 Unspecified rotator cuff tear or rupture of left shoulder, not specified as traumatic: Secondary | ICD-10-CM

## 2015-02-07 DIAGNOSIS — B373 Candidiasis of vulva and vagina: Secondary | ICD-10-CM

## 2015-02-07 LAB — CBC WITH DIFFERENTIAL/PLATELET
BASOS ABS: 0 10*3/uL (ref 0.0–0.1)
Basophils Relative: 0.3 % (ref 0.0–3.0)
EOS ABS: 0.4 10*3/uL (ref 0.0–0.7)
Eosinophils Relative: 3.8 % (ref 0.0–5.0)
HEMATOCRIT: 44.3 % (ref 36.0–46.0)
Hemoglobin: 14.7 g/dL (ref 12.0–15.0)
LYMPHS ABS: 3.4 10*3/uL (ref 0.7–4.0)
Lymphocytes Relative: 34.7 % (ref 12.0–46.0)
MCHC: 33.1 g/dL (ref 30.0–36.0)
MCV: 87.5 fl (ref 78.0–100.0)
MONOS PCT: 8.3 % (ref 3.0–12.0)
Monocytes Absolute: 0.8 10*3/uL (ref 0.1–1.0)
NEUTROS ABS: 5.2 10*3/uL (ref 1.4–7.7)
Neutrophils Relative %: 52.9 % (ref 43.0–77.0)
Platelets: 327 10*3/uL (ref 150.0–400.0)
RBC: 5.07 Mil/uL (ref 3.87–5.11)
RDW: 14.6 % (ref 11.5–15.5)
WBC: 9.8 10*3/uL (ref 4.0–10.5)

## 2015-02-07 LAB — LIPID PANEL
Cholesterol: 153 mg/dL (ref 0–200)
HDL: 45 mg/dL (ref >39.00)
LDL Cholesterol: 83 mg/dL (ref 0–99)
NonHDL: 108
TRIGLYCERIDES: 123 mg/dL (ref 0.0–149.0)
Total CHOL/HDL Ratio: 3
VLDL: 24.6 mg/dL (ref 0.0–40.0)

## 2015-02-07 LAB — COMPREHENSIVE METABOLIC PANEL
ALK PHOS: 78 U/L (ref 39–117)
ALT: 15 U/L (ref 0–35)
AST: 15 U/L (ref 0–37)
Albumin: 4.1 g/dL (ref 3.5–5.2)
BILIRUBIN TOTAL: 0.5 mg/dL (ref 0.2–1.2)
BUN: 9 mg/dL (ref 6–23)
CO2: 32 meq/L (ref 19–32)
Calcium: 9.8 mg/dL (ref 8.4–10.5)
Chloride: 103 mEq/L (ref 96–112)
Creatinine, Ser: 0.74 mg/dL (ref 0.40–1.20)
GFR: 100.64 mL/min (ref >60.00)
GLUCOSE: 103 mg/dL — AB (ref 70–99)
Potassium: 3.9 mEq/L (ref 3.5–5.1)
SODIUM: 140 meq/L (ref 135–145)
TOTAL PROTEIN: 7.4 g/dL (ref 6.0–8.3)

## 2015-02-07 LAB — HEMOGLOBIN A1C: Hgb A1c MFr Bld: 6.2 % (ref 4.6–6.5)

## 2015-02-07 MED ORDER — TERCONAZOLE 0.4 % VA CREA: 1.0000 | TOPICAL_CREAM | Freq: Every day | VAGINAL | Status: DC

## 2015-02-07 NOTE — Assessment & Plan Note (Signed)
Will repeat A1C and microalbumin today She will self refer for an eye exam She declines flu Pneumovax 03/2014 She will make nurse visit for Prevnar 03/2014 Discussed foods to avoid We will send in meter, strips and lancets and she will start keeping a log of her sugars We will continue Metformin at this time

## 2015-02-07 NOTE — Patient Instructions (Signed)
Diabetes and Standards of Medical Care Diabetes is complicated. You may find that your diabetes team includes a dietitian, nurse, diabetes educator, eye doctor, and more. To help everyone know what is going on and to help you get the care you deserve, the following schedule of care was developed to help keep you on track. Below are the tests, exams, vaccines, medicines, education, and plans you will need. HbA1c test This test shows how well you have controlled your glucose over the past 2-3 months. It is used to see if your diabetes management plan needs to be adjusted.   It is performed at least 2 times a year if you are meeting treatment goals.  It is performed 4 times a year if therapy has changed or if you are not meeting treatment goals. Blood pressure test  This test is performed at every routine medical visit. The goal is less than 140/90 mm Hg for most people, but 130/80 mm Hg in some cases. Ask your health care provider about your goal. Dental exam  Follow up with the dentist regularly. Eye exam  If you are diagnosed with type 1 diabetes as a child, get an exam upon reaching the age of 37 years or older and have had diabetes for 3-5 years. Yearly eye exams are recommended after that initial eye exam.  If you are diagnosed with type 1 diabetes as an adult, get an exam within 5 years of diagnosis and then yearly.  If you are diagnosed with type 2 diabetes, get an exam as soon as possible after the diagnosis and then yearly. Foot care exam  Visual foot exams are performed at every routine medical visit. The exams check for cuts, injuries, or other problems with the feet.  A comprehensive foot exam should be done yearly. This includes visual inspection as well as assessing foot pulses and testing for loss of sensation.  Check your feet nightly for cuts, injuries, or other problems with your feet. Tell your health care provider if anything is not healing. Kidney function test (urine  microalbumin)  This test is performed once a year.  Type 1 diabetes: The first test is performed 5 years after diagnosis.  Type 2 diabetes: The first test is performed at the time of diagnosis.  A serum creatinine and estimated glomerular filtration rate (eGFR) test is done once a year to assess the level of chronic kidney disease (CKD), if present. Lipid profile (cholesterol, HDL, LDL, triglycerides)  Performed every 5 years for most people.  The goal for LDL is less than 100 mg/dL. If you are at high risk, the goal is less than 70 mg/dL.  The goal for HDL is 40 mg/dL-50 mg/dL for men and 50 mg/dL-60 mg/dL for women. An HDL cholesterol of 60 mg/dL or higher gives some protection against heart disease.  The goal for triglycerides is less than 150 mg/dL. Influenza vaccine, pneumococcal vaccine, and hepatitis B vaccine  The influenza vaccine is recommended yearly.  It is recommended that people with diabetes who are over 24 years old get the pneumonia vaccine. In some cases, two separate shots may be given. Ask your health care provider if your pneumonia vaccination is up to date.  The hepatitis B vaccine is also recommended for adults with diabetes. Diabetes self-management education  Education is recommended at diagnosis and ongoing as needed. Treatment plan  Your treatment plan is reviewed at every medical visit. Document Released: 05/05/2009 Document Revised: 11/22/2013 Document Reviewed: 12/08/2012 [facility] Patient Information 2015 [place],  [employer]. This information is not intended to replace advice given to you by your health care provider. Make sure you discuss any questions you have with your health care provider.  

## 2015-02-07 NOTE — Assessment & Plan Note (Signed)
Controlled on Nexium CMET today 

## 2015-02-07 NOTE — Assessment & Plan Note (Signed)
Will check CMET and Lipid profile today Encouraged her to consume a low fat diet Continue Crestor at this time

## 2015-02-07 NOTE — Assessment & Plan Note (Signed)
Encouraged smoking cessation 

## 2015-02-07 NOTE — Progress Notes (Signed)
HPI  Pt presents to the clinic to for 6 month follow up of chronic condtions  Arthritis: Mostly in hands and knees. She is taking Advil as needed.  HTN: BP well controlled on HCTZ and Metoprolol. Her last ECG was done by her cardiologist in [place].  DM2: Her last A1C was 6.8%. She was started on Metformin at her last visit. She is not currently checking her blood sugars. Her last eye exam was within the last year. She does not flu shots. Pneumovax 03/2014. She plans to get her Prevnar 03/2015.  HLD: Denies myalgias on Crestor. Her last LDL was 84. She does try to consume a low fat diet.  GERD: She denies breakthrough symptoms on Nexium.  Left shoulder pain: S/P surgical repair. She does reports some soreness in her left shoulder. She is still unable to lift > 20 lbs. She reports she is about to start PT.  OSA : She is sleeping well on CPAP. She reports she does need new equipment but she will have to contact her pulmonologist to get the orders for that.  COPD: Mild, she uses Albuterol prn. She is down to 2 cigarettes per day. She is weaning herself down. She did try the patches but reports she is too sweaty and they don't stick. She does use the lozenges.  She also has some concerns today about vaginal itching. This started 4 days ago. She has not noticed any discharge or odor. She did just complete a course of Amoxicillin for a sinus infection and reports she typically gets a yeast infection with antibiotics. She reports oral medications do not work, she typically needs the 7 day course of vaginal cream.  Past Medical History  Diagnosis Date  . Chicken pox   . Arthritis   . Allergy   . Hypertension   . Hyperlipidemia     Current Outpatient Prescriptions  Medication Sig Dispense Refill  . albuterol (PROVENTIL HFA;VENTOLIN HFA) 108 (90 BASE) MCG/ACT inhaler Inhale 2 puffs into the lungs every 6 (six) hours as needed for wheezing or shortness of breath. 6.7 Inhaler 3  .  Cholecalciferol (VITAMIN D-3) 1000 UNITS CAPS Take 1 capsule by mouth daily.    . clindamycin (CLEOCIN) 300 MG capsule   0  . docusate sodium (COLACE) 100 MG capsule   0  . esomeprazole (NEXIUM) 40 MG capsule Take 1 capsule (40 mg total) by mouth daily at 12 noon. 90 capsule 3  . hydrochlorothiazide (HYDRODIURIL) 12.5 MG tablet Take 1 tablet (12.5 mg total) by mouth daily. 90 tablet 1  . ibuprofen (ADVIL,MOTRIN) 800 MG tablet Take 1 tablet (800 mg total) by mouth every 8 (eight) hours as needed. 60 tablet 2  . KLOR-CON M20 20 MEQ tablet TAKE 1 TABLET (20 MEQ TOTAL) BY MOUTH DAILY. 90 tablet 0  . metFORMIN (GLUCOPHAGE) 500 MG tablet TAKE 1 TABLET (500 MG TOTAL) BY MOUTH 2 (TWO) TIMES DAILY WITH A MEAL. 180 tablet 1  . metoprolol succinate (TOPROL-XL) 100 MG 24 hr tablet Take 1 tablet (100 mg total) by mouth daily. Take with or immediately following a meal. 90 tablet 1  . nicotine (NICODERM CQ - DOSED IN MG/24 HOURS) 14 mg/24hr patch Place 1 patch (14 mg total) onto the skin daily. Alternate sites daily, 30 patch 2  . oxyCODONE-acetaminophen (PERCOCET/ROXICET) 5-325 MG per tablet Take by mouth. for pain  0  . rosuvastatin (CRESTOR) 10 MG tablet Take 1 tablet (10 mg total) by mouth daily. 90 tablet 1  No current facility-administered medications for this visit.    Allergies  Allergen Reactions  . Aspirin Hives  . Darvon [Propoxyphene] Hives  . Latex Hives, Itching and Swelling  . Naproxen Hives and Itching    Family History  Problem Relation Age of Onset  . Heart disease Mother   . Arthritis Father   . Cancer Father     Prostate  . Diabetes Sister   . Diabetes Brother     History   Social History  . Marital Status: Widowed    Spouse Name: N/A  . Number of Children: N/A  . Years of Education: N/A   Occupational History  . Not on file.   Social History Main Topics  . Smoking status: Current Every Day Smoker -- 0.33 packs/day for 50 years    Types: Cigarettes  . Smokeless  tobacco: Never Used  . Alcohol Use: No  . Drug Use: No  . Sexual Activity: Not Currently   Other Topics Concern  . Not on file   Social History Narrative    ROS:  Constitutional: Denies fever, malaise, fatigue, headache or abrupt weight changes.  HEENT: Denies eye pain, eye redness, ear pain, ringing in the ears, wax buildup, runny nose, nasal congestion, bloody nose, or sore throat. Respiratory:  Denies difficulty breathing, shortness of breath, cough or sputum production.   Cardiovascular: Denies chest pain, chest tightness, palpitations or swelling in the hands or feet.  Gastrointestinal: Denies abdominal pain, bloating, constipation, diarrhea or blood in the stool.  GU: Pt reports vaginal itching. Denies abnormal bleeding or discharge. Musculoskeletal: Pt reports joint pains. Denies decrease in range of motion, difficulty with gait, muscle pain or joint swelling.  Skin: Denies redness, rashes, lesions or ulcercations.  Neurological: Denies dizziness, difficulty with memory, difficulty with speech or problems with balance and coordination.  Psych: Denies anxiety, depression, SI/HI.  No other specific complaints in a complete review of systems (except as listed in HPI above).  PE:  BP 130/86 mmHg  Pulse 73  Temp(Src) 98.3 F (36.8 C) (Oral)  Wt 236 lb (107.049 kg)  SpO2 98%  Wt Readings from Last 3 Encounters:  01/19/15 234 lb (106.142 kg)  10/24/14 243 lb (110.224 kg)  09/13/14 248 lb 8 oz (112.719 kg)    General: Appears her stated age, obese in NAD. Skin: Warm, dry and intact. No rashes or lesions noted. HEENT: Head: normal shape and size; Eyes: sclera white, no icterus, conjunctiva pink, PERRLA and EOMs intact;  Cardiovascular: Normal rate and rhythm. S1,S2 noted.  No murmur, rubs or gallops noted. No JVD or BLE edema. No carotid bruits noted. Pulmonary/Chest: Normal effort and positive vesicular breath sounds. No respiratory distress. No wheezes, rales or ronchi  noted.  Abdomen: Soft and nontender. Normal bowel sounds, no bruits noted. No distention or masses noted. Liver, spleen and kidneys non palpable. Pelvic: deferred. Musculoskeletal: Normal flexion and extension of the knees. Crepitus noted with ROM. No signs of joint swelling. Herbdens nodes noted bilaterally. Decreased internal and external rotation of the left shoulder. LUE strength  4/5, RUE 5/5. Neurological: Alert and oriented. Sensation intact to BLE. Psychiatric: Mood and affect normal. Behavior is normal. Judgment and thought content normal.    Assessment and Plan:  Candida Vaginitis:  Will defer wet prep secondary to recent antibiotic use eRx for Terazol cream, one application daily x 7 days  RTC in 6 months to follow up chronic conditions/annual exam

## 2015-02-07 NOTE — Assessment & Plan Note (Signed)
S/P surgical repair She is starting PT soon She will continue to follow with orhtopedics

## 2015-02-07 NOTE — Assessment & Plan Note (Signed)
Discussed how weight loss could improve her knee pain She will continue Aleve prn

## 2015-02-07 NOTE — Assessment & Plan Note (Signed)
Continue CPAP Continue to follow with pulmonology

## 2015-02-07 NOTE — Assessment & Plan Note (Signed)
Well controlled on Metoprolol and HCTZ Will check CBC and CMET today ECG today- left atrial enlargement, otherwise normal

## 2015-02-07 NOTE — Assessment & Plan Note (Signed)
Continue Albuterol prn Congratulated on her decreased smoking She will continue to follow with pulmonology

## 2015-02-07 NOTE — Progress Notes (Signed)
Pre visit review using our clinic review tool, if applicable. No additional management support is needed unless otherwise documented below in the visit note. 

## 2015-02-07 NOTE — Assessment & Plan Note (Signed)
Encouraged her to work on diet and exercise 

## 2015-02-08 ENCOUNTER — Other Ambulatory Visit: Payer: Self-pay

## 2015-02-08 ENCOUNTER — Other Ambulatory Visit: Payer: Self-pay | Admitting: Internal Medicine

## 2015-02-08 DIAGNOSIS — E119 Type 2 diabetes mellitus without complications: Secondary | ICD-10-CM

## 2015-02-08 LAB — MICROALBUMIN / CREATININE URINE RATIO
Creatinine,U: 63.8 mg/dL
Microalb Creat Ratio: 1.1 mg/g (ref 0.0–30.0)

## 2015-02-08 MED ORDER — GLUCOSE BLOOD VI STRP: 1.0000 | ORAL_STRIP | Freq: Three times a day (TID) | Status: DC

## 2015-02-08 MED ORDER — ONETOUCH LANCETS MISC: 1.0000 | Freq: Three times a day (TID) | Status: DC

## 2015-02-08 MED ORDER — ONETOUCH ULTRA 2 W/DEVICE KIT: 1.0000 | PACK | Freq: Once | Status: DC

## 2015-02-08 MED ORDER — METOPROLOL SUCCINATE ER 100 MG PO TB24: 100.0000 mg | ORAL_TABLET | Freq: Every day | ORAL | Status: DC

## 2015-02-08 NOTE — Addendum Note (Signed)
Addended by: Alvina ChouWALSH, Tadan Shill J on: 02/08/2015 03:17 PM   Modules accepted: Orders

## 2015-02-10 ENCOUNTER — Other Ambulatory Visit: Payer: Self-pay

## 2015-02-10 MED ORDER — ROSUVASTATIN CALCIUM 10 MG PO TABS: 10.0000 mg | ORAL_TABLET | Freq: Every day | ORAL | Status: DC

## 2015-02-10 MED ORDER — POTASSIUM CHLORIDE CRYS ER 20 MEQ PO TBCR: 20.0000 meq | EXTENDED_RELEASE_TABLET | Freq: Every day | ORAL | Status: DC

## 2015-02-10 MED ORDER — ESOMEPRAZOLE MAGNESIUM 40 MG PO CPDR: 40.0000 mg | DELAYED_RELEASE_CAPSULE | Freq: Every day | ORAL | Status: DC

## 2015-02-10 MED ORDER — HYDROCHLOROTHIAZIDE 12.5 MG PO TABS: 12.5000 mg | ORAL_TABLET | Freq: Every day | ORAL | Status: DC

## 2015-02-10 MED ORDER — METFORMIN HCL 500 MG PO TABS: 500.0000 mg | ORAL_TABLET | Freq: Two times a day (BID) | ORAL | Status: DC

## 2015-02-21 ENCOUNTER — Other Ambulatory Visit: Payer: Self-pay | Admitting: Internal Medicine

## 2015-02-21 DIAGNOSIS — Z1231 Encounter for screening mammogram for malignant neoplasm of breast: Secondary | ICD-10-CM

## 2015-02-22 ENCOUNTER — Ambulatory Visit
Admission: RE | Admit: 2015-02-22 | Discharge: 2015-02-22 | Disposition: A | Discharge status: Home / Self Care | Payer: Medicare Other | Source: Ambulatory Visit | Attending: Internal Medicine | Admitting: Internal Medicine

## 2015-02-22 ENCOUNTER — Other Ambulatory Visit: Payer: Self-pay | Admitting: Internal Medicine

## 2015-02-22 DIAGNOSIS — Z1231 Encounter for screening mammogram for malignant neoplasm of breast: Secondary | ICD-10-CM | POA: Insufficient documentation

## 2015-02-22 LAB — HM MAMMOGRAPHY

## 2015-02-27 ENCOUNTER — Encounter: Payer: Self-pay | Admitting: Neurology

## 2015-03-01 ENCOUNTER — Other Ambulatory Visit: Payer: Self-pay | Admitting: Internal Medicine

## 2015-03-09 ENCOUNTER — Telehealth: Payer: Self-pay | Admitting: *Deleted

## 2015-03-09 DIAGNOSIS — G4733 Obstructive sleep apnea (adult) (pediatric): Secondary | ICD-10-CM

## 2015-03-09 NOTE — Telephone Encounter (Signed)
Kim Harding unable to obtained old Sleep Study. Order placed to have pt do new sleep study. (HST)

## 2015-03-12 ENCOUNTER — Other Ambulatory Visit: Payer: Self-pay | Admitting: Internal Medicine

## 2015-03-13 DIAGNOSIS — Z09 Encounter for follow-up examination after completed treatment for conditions other than malignant neoplasm: Secondary | ICD-10-CM | POA: Diagnosis not present

## 2015-03-14 DIAGNOSIS — G4733 Obstructive sleep apnea (adult) (pediatric): Secondary | ICD-10-CM | POA: Diagnosis not present

## 2015-03-20 DIAGNOSIS — G4733 Obstructive sleep apnea (adult) (pediatric): Secondary | ICD-10-CM | POA: Diagnosis not present

## 2015-03-21 ENCOUNTER — Other Ambulatory Visit: Payer: Self-pay | Admitting: *Deleted

## 2015-03-21 DIAGNOSIS — G4733 Obstructive sleep apnea (adult) (pediatric): Secondary | ICD-10-CM

## 2015-03-28 ENCOUNTER — Telehealth: Payer: Self-pay

## 2015-03-28 NOTE — Telephone Encounter (Signed)
Pt request refill generic nexium; advised nexium rx # [facility] x [facility]. Pt will ck with pharmacy and cb if needed.

## 2015-03-29 ENCOUNTER — Ambulatory Visit (INDEPENDENT_AMBULATORY_CARE_PROVIDER_SITE_OTHER): Payer: Medicare Other | Admitting: Family Medicine

## 2015-03-29 ENCOUNTER — Ambulatory Visit: Payer: BLUE CROSS/BLUE SHIELD

## 2015-03-29 ENCOUNTER — Encounter: Payer: Self-pay | Admitting: Family Medicine

## 2015-03-29 VITALS — BP 118/68 | HR 74 | Temp 98.3°F | Ht 65.0 in | Wt 237.5 lb

## 2015-03-29 DIAGNOSIS — B373 Candidiasis of vulva and vagina: Secondary | ICD-10-CM

## 2015-03-29 DIAGNOSIS — B3731 Acute candidiasis of vulva and vagina: Secondary | ICD-10-CM

## 2015-03-29 DIAGNOSIS — J019 Acute sinusitis, unspecified: Secondary | ICD-10-CM | POA: Insufficient documentation

## 2015-03-29 DIAGNOSIS — J0100 Acute maxillary sinusitis, unspecified: Secondary | ICD-10-CM

## 2015-03-29 MED ORDER — TERCONAZOLE 0.4 % VA CREA: 1.0000 | TOPICAL_CREAM | Freq: Every day | VAGINAL | Status: DC

## 2015-03-29 MED ORDER — AMOXICILLIN-POT CLAVULANATE 875-125 MG PO TABS: 1.0000 | ORAL_TABLET | Freq: Two times a day (BID) | ORAL | Status: DC

## 2015-03-29 NOTE — Assessment & Plan Note (Signed)
S/p uri in a smoker  Disc symptomatic care - see instructions on AVS  Cover with augmentin  (terazol cream for a yeast infection if needed)  Enc to quit smoking Update if not starting to improve in a week or if worsening

## 2015-03-29 NOTE — Progress Notes (Signed)
Subjective:    Patient ID: Kim Harding, female    DOB: 02-Nov-1945, 69 y.o.   MRN: [id number]  HPI Here for URI symptoms   Last wed her throat became scratchy and then severe ST - (that has improved during the day) Now very congested Clear mucous from nose  A little cough at night -not severe (non productive but sometimes feels deep)  Has had a fever - was really hot on Sunday (unsure how high)   Face is not hurting , and no sinus pressure    Is a smoker  Has COPD -but not wheezing    advil-does not help Throat losenges- no help  Allegra D does not help either   Patient Active Problem List   Diagnosis Date Noted  . Tobacco abuse 09/06/2014  . OSA on CPAP 09/06/2014  . Diabetes mellitus type 2, controlled, without complications 35/32/9924  . Severe obesity (BMI >= 40) 08/09/2014  . Essential hypertension 08/09/2014  . HLD (hyperlipidemia) 08/09/2014  . GERD (gastroesophageal reflux disease) 08/09/2014  . Osteoarthritis 08/09/2014  . Left rotator cuff tear 08/09/2014  . COPD, mild 08/09/2014   Past Medical History  Diagnosis Date  . Chicken pox   . Arthritis   . Allergy   . Hypertension   . Hyperlipidemia    Past Surgical History  Procedure Laterality Date  . Cholecystectomy  2012  . Tonsillectomy  1960  . Abdominal hysterectomy  1994    partial  . Replacement total knee bilateral    . Bariatric surgery      sleeve--2014  . Shoulder surgery Left 09/2014    rotator cuff   Social History  Substance Use Topics  . Smoking status: Current Every Day Smoker -- 0.33 packs/day for 50 years    Types: Cigarettes  . Smokeless tobacco: Never Used  . Alcohol Use: No   Family History  Problem Relation Age of Onset  . Heart disease Mother   . Arthritis Father   . Cancer Father     Prostate  . Diabetes Sister   . Diabetes Brother   . Breast cancer Neg Hx    Allergies  Allergen Reactions  . Aspirin Hives  . Darvon [Propoxyphene] Hives  . Latex Hives,  Itching and Swelling  . Naproxen Hives and Itching   Current Outpatient Prescriptions on File Prior to Visit  Medication Sig Dispense Refill  . albuterol (PROVENTIL HFA;VENTOLIN HFA) 108 (90 BASE) MCG/ACT inhaler Inhale 2 puffs into the lungs every 6 (six) hours as needed for wheezing or shortness of breath. 6.7 Inhaler 3  . Blood Glucose Monitoring Suppl (ONE TOUCH ULTRA 2) W/DEVICE KIT 1 Device by Does not apply route once. 1 each 0  . Cholecalciferol (VITAMIN D-3) 1000 UNITS CAPS Take 1 capsule by mouth daily.    Marland Kitchen esomeprazole (NEXIUM) 40 MG capsule Take 1 capsule (40 mg total) by mouth daily at 12 noon. 90 capsule 3  . glucose blood (ONE TOUCH TEST STRIPS) test strip 1 each by Other route 3 (three) times daily. Use as instructed 300 each 3  . hydrochlorothiazide (MICROZIDE) 12.5 MG capsule TAKE ONE CAPSULE BY MOUTH EVERY DAY 90 capsule 1  . ibuprofen (ADVIL,MOTRIN) 800 MG tablet Take 1 tablet (800 mg total) by mouth every 8 (eight) hours as needed. 60 tablet 2  . metFORMIN (GLUCOPHAGE) 500 MG tablet Take 1 tablet (500 mg total) by mouth 2 (two) times daily with a meal. 180 tablet 1  . metoprolol succinate (TOPROL-XL) 100  MG 24 hr tablet Take 1 tablet (100 mg total) by mouth daily. Take with or immediately following a meal. 90 tablet 1  . ONE TOUCH LANCETS MISC 1 each by Does not apply route 3 (three) times daily. 400 each 3  . potassium chloride SA (KLOR-CON M20) 20 MEQ tablet Take 1 tablet (20 mEq total) by mouth daily. 90 tablet 1  . rosuvastatin (CRESTOR) 10 MG tablet Take 1 tablet (10 mg total) by mouth daily. 90 tablet 1  . rosuvastatin (CRESTOR) 10 MG tablet TAKE 1 TABLET BY MOUTH EVERY DAY 90 tablet 2  . terconazole (TERAZOL 7) 0.4 % vaginal cream Place 1 applicator vaginally at bedtime. 45 g 0   No current facility-administered medications on file prior to visit.      Review of Systems Review of Systems  Constitutional: Negative for fever, appetite change  and unexpected weight  change. pos for fatigue and malaise  ENT pos for cong and rhinorrhea and ST Eyes: Negative for pain and visual disturbance.  Respiratory: Negative for  shortness of breath.   Cardiovascular: Negative for cp or palpitations    Gastrointestinal: Negative for nausea, diarrhea and constipation.  Genitourinary: Negative for urgency and frequency.  Skin: Negative for pallor or rash   Neurological: Negative for weakness, light-headedness, numbness and headaches.  Hematological: Negative for adenopathy. Does not bruise/bleed easily.  Psychiatric/Behavioral: Negative for dysphoric mood. The patient is not nervous/anxious.         Objective:   Physical Exam  Constitutional: She appears well-developed and well-nourished. No distress.  obese and well appearing   HENT:  Head: Normocephalic and atraumatic.  Right Ear: External ear normal.  Left Ear: External ear normal.  Mouth/Throat: Oropharynx is clear and moist. No oropharyngeal exudate.  Nares are injected and congested  Bilateral maxillary sinus tenderness  Post nasal drip   Eyes: Conjunctivae and EOM are normal. Pupils are equal, round, and reactive to light. Right eye exhibits no discharge. Left eye exhibits no discharge.  Neck: Normal range of motion. Neck supple.  Cardiovascular: Normal rate and regular rhythm.   Pulmonary/Chest: Effort normal and breath sounds normal. No respiratory distress. She has no wheezes. She has no rales. She exhibits no tenderness.  Diffusely distant bs   Harsh bs with fair air exch   Lymphadenopathy:    She has no cervical adenopathy.  Neurological: She is alert. No cranial nerve deficit.  Skin: Skin is warm and dry. No rash noted.  Psychiatric: She has a normal mood and affect.          Assessment & Plan:   Problem List Items Addressed This Visit      Respiratory   Acute sinusitis - Primary    S/p uri in a smoker  Disc symptomatic care - see instructions on AVS  Cover with augmentin    (terazol cream for a yeast infection if needed)  Enc to quit smoking Update if not starting to improve in a week or if worsening          Relevant Medications   amoxicillin-clavulanate (AUGMENTIN) 875-125 MG per tablet    Other Visit Diagnoses    Vaginal candidiasis        Relevant Medications    terconazole (TERAZOL 7) 0.4 % vaginal cream

## 2015-03-29 NOTE — Patient Instructions (Signed)
Drink lots of fluids Try to quit smoking while you are sick! Rest  Take the augmentin for sinus infection as directed terazol cream if you get a yeast infection   Update if not starting to improve in a week or if worsening

## 2015-03-29 NOTE — Progress Notes (Signed)
Pre visit review using our clinic review tool, if applicable. No additional management support is needed unless otherwise documented below in the visit note. 

## 2015-03-31 ENCOUNTER — Other Ambulatory Visit: Payer: Self-pay

## 2015-03-31 DIAGNOSIS — E119 Type 2 diabetes mellitus without complications: Secondary | ICD-10-CM

## 2015-03-31 MED ORDER — ONETOUCH ULTRA 2 W/DEVICE KIT: 1.0000 | PACK | Freq: Once | Status: DC

## 2015-03-31 MED ORDER — ESOMEPRAZOLE MAGNESIUM 40 MG PO CPDR: 40.0000 mg | DELAYED_RELEASE_CAPSULE | Freq: Every day | ORAL | Status: DC

## 2015-03-31 MED ORDER — ONETOUCH LANCETS MISC: 1.0000 | Freq: Three times a day (TID) | Status: DC

## 2015-03-31 MED ORDER — GLUCOSE BLOOD VI STRP: 1.0000 | ORAL_STRIP | Freq: Three times a day (TID) | Status: DC

## 2015-03-31 NOTE — Telephone Encounter (Signed)
Pt left note that [facility] did not receive glucose meter, test strips,lancets or esomeprazole. refaxed per pt request.

## 2015-04-05 ENCOUNTER — Telehealth: Payer: Self-pay | Admitting: *Deleted

## 2015-04-05 NOTE — Telephone Encounter (Signed)
Done, placed in MYD box to fax back 

## 2015-04-05 NOTE — Telephone Encounter (Signed)
Pt does not use Supply company for diabetic supplies---i called to confirm

## 2015-04-05 NOTE — Telephone Encounter (Signed)
Form for diabetic testing supplies in your IN box for completion. 

## 2015-04-06 ENCOUNTER — Telehealth: Payer: Self-pay | Admitting: Internal Medicine

## 2015-04-06 ENCOUNTER — Ambulatory Visit (INDEPENDENT_AMBULATORY_CARE_PROVIDER_SITE_OTHER): Payer: Medicare Other | Admitting: *Deleted

## 2015-04-06 DIAGNOSIS — Z23 Encounter for immunization: Secondary | ICD-10-CM | POA: Diagnosis not present

## 2015-04-06 DIAGNOSIS — Z0279 Encounter for issue of other medical certificate: Secondary | ICD-10-CM

## 2015-04-06 NOTE — Telephone Encounter (Signed)
Pt dropped off medication evaluation to be completed. Form in Melanie's inbox. Thank you.

## 2015-04-06 NOTE — Telephone Encounter (Signed)
Done, placed in MYD inbox 

## 2015-04-06 NOTE — Telephone Encounter (Signed)
This form is in your inbox to review

## 2015-04-07 NOTE — Telephone Encounter (Signed)
Pt is aware--form placed in front office for pick up and copy for chart sent to scan

## 2015-06-29 ENCOUNTER — Telehealth: Payer: Self-pay | Admitting: Internal Medicine

## 2015-06-29 NOTE — Telephone Encounter (Signed)
Pt dropped off disability parking placard application to be signed. Best number to contact pt is [phone]. Placing ppw in [facility] rx tower slot.

## 2015-06-30 NOTE — Telephone Encounter (Signed)
In your inbox.

## 2015-06-30 NOTE — Telephone Encounter (Signed)
Pt is aware--form placed in front office for pick up 

## 2015-06-30 NOTE — Telephone Encounter (Signed)
Done, placed back in your inbox.

## 2015-07-24 ENCOUNTER — Other Ambulatory Visit: Payer: Self-pay | Admitting: Internal Medicine

## 2015-08-19 ENCOUNTER — Other Ambulatory Visit: Payer: Self-pay | Admitting: Internal Medicine

## 2015-09-21 ENCOUNTER — Emergency Department
Admission: EM | Admit: 2015-09-21 | Discharge: 2015-09-21 | Disposition: A | Discharge status: Home / Self Care | Payer: Medicare Other | Attending: Emergency Medicine | Admitting: Emergency Medicine

## 2015-09-21 ENCOUNTER — Emergency Department: Payer: Medicare Other

## 2015-09-21 ENCOUNTER — Encounter: Payer: Self-pay | Admitting: *Deleted

## 2015-09-21 DIAGNOSIS — Z7984 Long term (current) use of oral hypoglycemic drugs: Secondary | ICD-10-CM | POA: Diagnosis not present

## 2015-09-21 DIAGNOSIS — R42 Dizziness and giddiness: Secondary | ICD-10-CM | POA: Diagnosis present

## 2015-09-21 DIAGNOSIS — Z79899 Other long term (current) drug therapy: Secondary | ICD-10-CM | POA: Insufficient documentation

## 2015-09-21 DIAGNOSIS — F17210 Nicotine dependence, cigarettes, uncomplicated: Secondary | ICD-10-CM | POA: Diagnosis not present

## 2015-09-21 DIAGNOSIS — Z91040 Latex allergy status: Secondary | ICD-10-CM | POA: Diagnosis not present

## 2015-09-21 DIAGNOSIS — I10 Essential (primary) hypertension: Secondary | ICD-10-CM | POA: Diagnosis not present

## 2015-09-21 DIAGNOSIS — E119 Type 2 diabetes mellitus without complications: Secondary | ICD-10-CM | POA: Insufficient documentation

## 2015-09-21 DIAGNOSIS — Z792 Long term (current) use of antibiotics: Secondary | ICD-10-CM | POA: Diagnosis not present

## 2015-09-21 LAB — URINALYSIS COMPLETE WITH MICROSCOPIC (ARMC ONLY)
Bacteria, UA: NONE SEEN
Bilirubin Urine: NEGATIVE
Glucose, UA: NEGATIVE mg/dL
Hgb urine dipstick: NEGATIVE
Ketones, ur: NEGATIVE mg/dL
Leukocytes, UA: NEGATIVE
Nitrite: NEGATIVE
PROTEIN: NEGATIVE mg/dL
RBC / HPF: NONE SEEN RBC/hpf (ref 0–5)
SPECIFIC GRAVITY, URINE: 1.004 — AB (ref 1.005–1.030)
pH: 6 (ref 5.0–8.0)

## 2015-09-21 LAB — CBC
HEMATOCRIT: 42.6 % (ref 35.0–47.0)
HEMOGLOBIN: 14.4 g/dL (ref 12.0–16.0)
MCH: 29.5 pg (ref 26.0–34.0)
MCHC: 33.8 g/dL (ref 32.0–36.0)
MCV: 87.4 fL (ref 80.0–100.0)
Platelets: 278 10*3/uL (ref 150–440)
RBC: 4.87 MIL/uL (ref 3.80–5.20)
RDW: 14.6 % — ABNORMAL HIGH (ref 11.5–14.5)
WBC: 11 10*3/uL (ref 3.6–11.0)

## 2015-09-21 LAB — BASIC METABOLIC PANEL
Anion gap: 8 (ref 5–15)
BUN: 8 mg/dL (ref 6–20)
CHLORIDE: 105 mmol/L (ref 101–111)
CO2: 27 mmol/L (ref 22–32)
CREATININE: 0.71 mg/dL (ref 0.44–1.00)
Calcium: 9.2 mg/dL (ref 8.9–10.3)
Glucose, Bld: 124 mg/dL — ABNORMAL HIGH (ref 65–99)
POTASSIUM: 3.8 mmol/L (ref 3.5–5.1)
Sodium: 140 mmol/L (ref 135–145)

## 2015-09-21 LAB — TROPONIN I: TROPONIN I: 0.04 ng/mL — AB (ref <0.031)

## 2015-09-21 MED ORDER — MECLIZINE HCL 25 MG PO TABS: 25.0000 mg | ORAL_TABLET | Freq: Three times a day (TID) | ORAL | Status: DC | PRN

## 2015-09-21 NOTE — ED Notes (Signed)
Patient ambulated up and down hallway. Patient tolerated well. Patient states, "the dizziness is still there but I feel much better than I did this morning."

## 2015-09-21 NOTE — ED Provider Notes (Signed)
[facility] Provider Note  ____________________________________________  Time seen: Approximately 5:04 PM  I have reviewed the triage vital signs and the nursing notes.   HISTORY  Chief Complaint Dizziness    HPI Kim Harding is a 70 y.o. female with a past medical history of obesity, diabetes well controlled with oral medication, and hypertension who presents with acute onset of vertigo starting this morning.  She reports that she ate a little bit less than usual last night and wondered if that was what caused it.  She got up this morning and felt a little "off".  When she went to the bathroom and sat down on the toilet she felt like the entire room was spinning.  As long as she remains still she is asymptomatic but when she gets up and moves around she feels the symptoms again.  She denies headache, neck pain, stiff neck, ear pain, ringing in her ears, fever/chills, chest pain, shortness of breath, nausea, vomiting, diarrhea, dysuria, abdominal pain.  Remaining still makes her symptoms better and moving makes them worse.  She is not having any blurred or double vision. She describes her symptoms as severe when she is moving around and nonexistent when she is still.   Past Medical History  Diagnosis Date  . Chicken pox   . Arthritis   . Allergy   . Hypertension   . Hyperlipidemia     Patient Active Problem List   Diagnosis Date Noted  . Acute sinusitis 03/29/2015  . Tobacco abuse 09/06/2014  . OSA on CPAP 09/06/2014  . Diabetes mellitus type 2, controlled, without complications ([place]) 27/74/1287  . Severe obesity (BMI >= 40) ([place]) 08/09/2014  . Essential hypertension 08/09/2014  . HLD (hyperlipidemia) 08/09/2014  . GERD (gastroesophageal reflux disease) 08/09/2014  . Osteoarthritis 08/09/2014  . Left rotator cuff tear 08/09/2014  . COPD, mild ([place]) 08/09/2014    Past Surgical History  Procedure Laterality Date  .  Cholecystectomy  2012  . Tonsillectomy  1960  . Abdominal hysterectomy  1994    partial  . Replacement total knee bilateral    . Bariatric surgery      sleeve--2014  . Shoulder surgery Left 09/2014    rotator cuff    Current Outpatient Rx  Name  Route  Sig  Dispense  Refill  . albuterol (PROVENTIL HFA;VENTOLIN HFA) 108 (90 BASE) MCG/ACT inhaler   Inhalation   Inhale 2 puffs into the lungs every 6 (six) hours as needed for wheezing or shortness of breath.   6.7 Inhaler   3   . amoxicillin-clavulanate (AUGMENTIN) 875-125 MG per tablet   Oral   Take 1 tablet by mouth 2 (two) times daily.   20 tablet   0   . Blood Glucose Monitoring Suppl (ONE TOUCH ULTRA 2) W/DEVICE KIT   Does not apply   1 Device by Does not apply route once.   1 each   0     Diagnosis code E11.9   . Cholecalciferol (VITAMIN D-3) 1000 UNITS CAPS   Oral   Take 1 capsule by mouth daily.         Marland Kitchen esomeprazole (NEXIUM) 40 MG capsule   Oral   Take 1 capsule (40 mg total) by mouth daily at 12 noon.   90 capsule   3   . glucose blood (ONE TOUCH TEST STRIPS) test strip   Other   1 each by Other route 3 (three) times daily. Use as instructed  300 each   3     Diagnosis code E11.9   . hydrochlorothiazide (HYDRODIURIL) 12.5 MG tablet   Oral   Take 1 tablet (12.5 mg total) by mouth daily. MUST SCHEDULE ANNUAL PHYSICAL EXAM   90 tablet   0   . ibuprofen (ADVIL,MOTRIN) 800 MG tablet   Oral   Take 1 tablet (800 mg total) by mouth every 8 (eight) hours as needed.   60 tablet   2   . meclizine (ANTIVERT) 25 MG tablet   Oral   Take 1 tablet (25 mg total) by mouth 3 (three) times daily as needed for dizziness.   30 tablet   0   . metFORMIN (GLUCOPHAGE) 500 MG tablet   Oral   Take 1 tablet (500 mg total) by mouth 2 (two) times daily with a meal.   180 tablet   1   . metoprolol succinate (TOPROL-XL) 100 MG 24 hr tablet   Oral   Take 1 tablet (100 mg total) by mouth daily. MUST SCHEDULE ANNUAL  PHYSICAL EXAM   90 tablet   0   . ONE TOUCH LANCETS MISC   Does not apply   1 each by Does not apply route 3 (three) times daily.   400 each   3     Diagnosis code E11.9   . potassium chloride SA (K-DUR,KLOR-CON) 20 MEQ tablet   Oral   Take 1 tablet (20 mEq total) by mouth daily. MUST SCHEDULE ANNUAL PHYSICAL EXAM   90 tablet   0   . rosuvastatin (CRESTOR) 10 MG tablet      TAKE 1 TABLET DAILY   90 tablet   0   . terconazole (TERAZOL 7) 0.4 % vaginal cream   Vaginal   Place 1 applicator vaginally at bedtime.   45 g   0     Allergies Aspirin; Darvon; Latex; and Naproxen  Family History  Problem Relation Age of Onset  . Heart disease Mother   . Arthritis Father   . Cancer Father     Prostate  . Diabetes Sister   . Diabetes Brother   . Breast cancer Neg Hx     Social History Social History  Substance Use Topics  . Smoking status: Current Every Day Smoker -- 0.33 packs/day for 50 years    Types: Cigarettes  . Smokeless tobacco: Never Used  . Alcohol Use: No    Review of Systems Constitutional: No fever/chills Eyes: No visual changes. ENT: No sore throat. Cardiovascular: Denies chest pain. Respiratory: Denies shortness of breath. Gastrointestinal: No abdominal pain.  No nausea, no vomiting.  No diarrhea.  No constipation. Genitourinary: Negative for dysuria. Musculoskeletal: Negative for back pain. Skin: Negative for rash. Neurological: Negative for headaches, focal weakness or numbness.  Room-spinning vertigo  10-point ROS otherwise negative.  ____________________________________________   PHYSICAL EXAM:  VITAL SIGNS: ED Triage Vitals  Enc Vitals Group     BP 09/21/15 1413 123/69 mmHg     Pulse Rate 09/21/15 1413 76     Resp 09/21/15 1413 18     Temp 09/21/15 1413 98.2 F (36.8 C)     Temp Source 09/21/15 1413 Oral     SpO2 09/21/15 1413 96 %     Weight 09/21/15 1413 240 lb (108.863 kg)     Height 09/21/15 1413 _0  (1.676 m)     Head  Cir --      Peak Flow --      Pain Score 09/21/15  1414 0     Pain Loc --      Pain Edu? --      Excl. in [place]? --     Constitutional: Alert and oriented. Well appearing and in no acute distress. Eyes: Conjunctivae are normal. PERRL. EOMI. no nystagmus Head: Atraumatic. Nose: No congestion/rhinnorhea. Mouth/Throat: Mucous membranes are moist.  Oropharynx non-erythematous. Neck: No stridor.   Cardiovascular: Normal rate, regular rhythm. Grossly normal heart sounds.  Good peripheral circulation. Respiratory: Normal respiratory effort.  No retractions. Lungs CTAB. Gastrointestinal: Soft and nontender. No distention. No abdominal bruits. No CVA tenderness. Musculoskeletal: No lower extremity tenderness nor edema.  No joint effusions. Neurologic:  Normal speech and language. No gross focal neurologic deficits are appreciated.  Normal strength in all extremities.  No dysmetria with normal finger to nose.  No gait disturbances, able to ambulate up and down the hall without difficulty.   Skin:  Skin is warm, dry and intact. No rash noted. Psychiatric: Mood and affect are normal. Speech and behavior are normal.  ____________________________________________   LABS (all labs ordered are listed, but only abnormal results are displayed)  Labs Reviewed  BASIC METABOLIC PANEL - Abnormal; Notable for the following:    Glucose, Bld 124 (*)    All other components within normal limits  CBC - Abnormal; Notable for the following:    RDW 14.6 (*)    All other components within normal limits  URINALYSIS COMPLETEWITH MICROSCOPIC ([facility] ONLY) - Abnormal; Notable for the following:    Color, Urine YELLOW (*)    APPearance CLEAR (*)    Specific Gravity, Urine 1.004 (*)    Squamous Epithelial / LPF 0-5 (*)    All other components within normal limits  TROPONIN I - Abnormal; Notable for the following:    Troponin I 0.04 (*)    All other components within normal limits  CBG MONITORING, ED    ____________________________________________  EKG  ED ECG REPORT I, Amando Chaput, the attending physician, personally viewed and interpreted this ECG.   Date: 09/21/2015  EKG Time: 14:17  Rate: 75  Rhythm: normal sinus rhythm  Axis: Normal  Intervals:Normal with LVH  ST&T Change: Non-specific ST segment / T-wave changes, but no evidence of acute ischemia.  ____________________________________________  RADIOLOGY   Ct Head Wo Contrast  09/21/2015  CLINICAL DATA:  Acute onset of vertigo EXAM: CT HEAD WITHOUT CONTRAST TECHNIQUE: Contiguous axial images were obtained from the base of the skull through the vertex without intravenous contrast. COMPARISON:  None. FINDINGS: No skull fracture is noted. Paranasal sinuses and mastoid air cells are unremarkable. No intracranial hemorrhage, mass effect or midline shift. No acute cortical infarction.  Mild cerebral atrophy. There is bilateral parietal lobe mild focal subcortical white matter decreased attenuation with symmetrical appearance please see axial images 22 and 20. This most likely due to chronic small vessel ischemic changes. No definite acute infarction. No mass lesion is noted on this unenhanced scan. IMPRESSION: No intracranial hemorrhage, mass effect or midline shift. No definite acute cortical infarction. There is bilateral parietal lobe mild focal subcortical white matter decreased attenuation with symmetrical appearance please see axial images 22 and 20. This most likely due to chronic small vessel ischemic changes. No definite acute infarction. Clinical correlation is necessary. Further correlation with MRI could be performed as clinically warranted. Electronically Signed   By: Lahoma Crocker M.D.   On: 09/21/2015 17:40    ____________________________________________   PROCEDURES  Procedure(s) performed: None  Critical Care performed: No  ____________________________________________   INITIAL IMPRESSION / ASSESSMENT AND PLAN /  ED COURSE  Pertinent labs & imaging results that were available during my care of the patient were reviewed by me and considered in my medical decision making (see chart for details).  Reassuring workup.  The patient's symptoms have completely resolved even without meclizine.  I gave her my usual customary discussion of central versus peripheral causes of vertigo and explained that her workup was very reassuring today although he can never be 100% sure that it was not central cause.  I believe that her CT scan, particular since the findings are bilateral, represent normal age-related chronic small vessel ischemic changes but do not represent an acute infarction; her symptoms do not clinically correlate with the head CT.  I explained her the need for outpatient follow-up.  I gave my usual and customary return precautions.     ____________________________________________  FINAL CLINICAL IMPRESSION(S) / ED DIAGNOSES  Final diagnoses:  Vertigo      NEW MEDICATIONS STARTED DURING THIS VISIT:  New Prescriptions   MECLIZINE (ANTIVERT) 25 MG TABLET    Take 1 tablet (25 mg total) by mouth 3 (three) times daily as needed for dizziness.      Note:  This document was prepared using Dragon voice recognition software and may include unintentional dictation errors.   Hinda Kehr, MD 09/21/15 [id number]

## 2015-09-21 NOTE — ED Notes (Signed)
States since she woke up this AM she has felt dizziness when she is up and moving, states chest tightness this AM that has since resolved, states some nausea, pt awake and alert

## 2015-09-21 NOTE — Discharge Instructions (Signed)
We believe your symptoms were caused by benign vertigo.  Please read through the included information and take any prescribed medication(s).  Follow up with your doctor as listed above.  If you develop any new or worsening symptoms that concern you, including but not limited to persistent dizziness/vertigo, numbness or weakness in your arms or legs, altered mental status, persistent vomiting, or fever greater than 101, please return immediately to the Emergency Department.   Benign Positional Vertigo Vertigo is the feeling that you or your surroundings are moving when they are not. Benign positional vertigo is the most common form of vertigo. The cause of this condition is not serious (is benign). This condition is triggered by certain movements and positions (is positional). This condition can be dangerous if it occurs while you are doing something that could endanger you or others, such as driving.  CAUSES In many cases, the cause of this condition is not known. It may be caused by a disturbance in an area of the inner ear that helps your brain to sense movement and balance. This disturbance can be caused by a viral infection (labyrinthitis), head injury, or repetitive motion. RISK FACTORS This condition is more likely to develop in:  Women.  People who are 20 years of age or older. SYMPTOMS Symptoms of this condition usually happen when you move your head or your eyes in different directions. Symptoms may start suddenly, and they usually last for less than a minute. Symptoms may include:  Loss of balance and falling.  Feeling like you are spinning or moving.  Feeling like your surroundings are spinning or moving.  Nausea and vomiting.  Blurred vision.  Dizziness.  Involuntary eye movement (nystagmus). Symptoms can be mild and cause only slight annoyance, or they can be severe and interfere with daily life. Episodes of benign positional vertigo may return (recur) over time, and they  may be triggered by certain movements. Symptoms may improve over time. DIAGNOSIS This condition is usually diagnosed by medical history and a physical exam of the head, neck, and ears. You may be referred to a health care provider who specializes in ear, nose, and throat (ENT) problems (otolaryngologist) or a provider who specializes in disorders of the nervous system (neurologist). You may have additional testing, including:  MRI.  A CT scan.  Eye movement tests. Your health care provider may ask you to change positions quickly while he or she watches you for symptoms of benign positional vertigo, such as nystagmus. Eye movement may be tested with an electronystagmogram (ENG), caloric stimulation, the Dix-Hallpike test, or the roll test.  An electroencephalogram (EEG). This records electrical activity in your brain.  Hearing tests. TREATMENT Usually, your health care provider will treat this by moving your head in specific positions to adjust your inner ear back to normal. Surgery may be needed in severe cases, but this is rare. In some cases, benign positional vertigo may resolve on its own in 2-4 weeks. HOME CARE INSTRUCTIONS Safety  Move slowly.Avoid sudden body or head movements.  Avoid driving.  Avoid operating heavy machinery.  Avoid doing any tasks that would be dangerous to you or others if a vertigo episode would occur.  If you have trouble walking or keeping your balance, try using a cane for stability. If you feel dizzy or unstable, sit down right away.  Return to your normal activities as told by your health care provider. Ask your health care provider what activities are safe for you. General Instructions  Take  over-the-counter and prescription medicines only as told by your health care provider.  Avoid certain positions or movements as told by your health care provider.  Drink enough fluid to keep your urine clear or pale yellow.  Keep all follow-up visits as told  by your health care provider. This is important. SEEK MEDICAL CARE IF:  You have a fever.  Your condition gets worse or you develop new symptoms.  Your family or friends notice any behavioral changes.  Your nausea or vomiting gets worse.  You have numbness or a "pins and needles" sensation. SEEK IMMEDIATE MEDICAL CARE IF:  You have difficulty speaking or moving.  You are always dizzy.  You faint.  You develop severe headaches.  You have weakness in your legs or arms.  You have changes in your hearing or vision.  You develop a stiff neck.  You develop sensitivity to light.   This information is not intended to replace advice given to you by your health care provider. Make sure you discuss any questions you have with your health care provider.   Document Released: 04/15/2006 Document Revised: 03/29/2015 Document Reviewed: 10/31/2014 Elsevier Interactive Patient Education 2016 [employer]  Dizziness Dizziness is a common problem. It is a feeling of unsteadiness or light-headedness. You may feel like you are about to faint. Dizziness can lead to injury if you stumble or fall. Anyone can become dizzy, but dizziness is more common in older adults. This condition can be caused by a number of things, including medicines, dehydration, or illness. HOME CARE INSTRUCTIONS Taking these steps may help with your condition: Eating and Drinking  Drink enough fluid to keep your urine clear or pale yellow. This helps to keep you from becoming dehydrated. Try to drink more clear fluids, such as water.  Do not drink alcohol.  Limit your caffeine intake if directed by your health care provider.  Limit your salt intake if directed by your health care provider. Activity  Avoid making quick movements.  Rise slowly from chairs and steady yourself until you feel okay.  In the morning, first sit up on the side of the bed. When you feel okay, stand slowly while you hold onto something  until you know that your balance is fine.  Move your legs often if you need to stand in one place for a long time. Tighten and relax your muscles in your legs while you are standing.  Do not drive or operate heavy machinery if you feel dizzy.  Avoid bending down if you feel dizzy. Place items in your home so that they are easy for you to reach without leaning over. Lifestyle  Do not use any tobacco products, including cigarettes, chewing tobacco, or electronic cigarettes. If you need help quitting, ask your health care provider.  Try to reduce your stress level, such as with yoga or meditation. Talk with your health care provider if you need help. General Instructions  Watch your dizziness for any changes.  Take medicines only as directed by your health care provider. Talk with your health care provider if you think that your dizziness is caused by a medicine that you are taking.  Tell a friend or a family member that you are feeling dizzy. If he or she notices any changes in your behavior, have this person call your health care provider.  Keep all follow-up visits as directed by your health care provider. This is important. SEEK MEDICAL CARE IF:  Your dizziness does not go away.  Your  dizziness or light-headedness gets worse.  You feel nauseous.  You have reduced hearing.  You have new symptoms.  You are unsteady on your feet or you feel like the room is spinning. SEEK IMMEDIATE MEDICAL CARE IF:  You vomit or have diarrhea and are unable to eat or drink anything.  You have problems talking, walking, swallowing, or using your arms, hands, or legs.  You feel generally weak.  You are not thinking clearly or you have trouble forming sentences. It may take a friend or family member to notice this.  You have chest pain, abdominal pain, shortness of breath, or sweating.  Your vision changes.  You notice any bleeding.  You have a headache.  You have neck pain or a stiff  neck.  You have a fever.   This information is not intended to replace advice given to you by your health care provider. Make sure you discuss any questions you have with your health care provider.   Document Released: 01/01/2001 Document Revised: 11/22/2014 Document Reviewed: 07/04/2014 Elsevier Interactive Patient Education [employer]

## 2015-09-25 ENCOUNTER — Encounter: Payer: Self-pay | Admitting: Internal Medicine

## 2015-09-25 ENCOUNTER — Ambulatory Visit (INDEPENDENT_AMBULATORY_CARE_PROVIDER_SITE_OTHER): Payer: Medicare Other | Admitting: Internal Medicine

## 2015-09-25 VITALS — BP 126/80 | HR 73 | Temp 98.8°F | Wt 248.0 lb

## 2015-09-25 DIAGNOSIS — R42 Dizziness and giddiness: Secondary | ICD-10-CM

## 2015-09-25 NOTE — Progress Notes (Signed)
Pre visit review using our clinic review tool, if applicable. No additional management support is needed unless otherwise documented below in the visit note. 

## 2015-09-25 NOTE — Progress Notes (Signed)
Subjective:    Patient ID: Kim Harding, female    DOB: [birth year], 70 y.o.   MRN: [id number]  HPI  Pt presents to the clinic today for ER follow up. She went to the ER 3/4 with c/o dizziness. Labs were unremarkable. CT head showed no acute findings. She was diagnosed with vertigo and prescribed Meclizine. She took 1 dose of the Meclizine. She has not had any recurrent symptoms since her ER visit. She is a little fatigued but that had been going on prior to her ER visit. Appetite is good.  Review of Systems      Past Medical History  Diagnosis Date  . Chicken pox   . Arthritis   . Allergy   . Hypertension   . Hyperlipidemia     Current Outpatient Prescriptions  Medication Sig Dispense Refill  . albuterol (PROVENTIL HFA;VENTOLIN HFA) 108 (90 BASE) MCG/ACT inhaler Inhale 2 puffs into the lungs every 6 (six) hours as needed for wheezing or shortness of breath. 6.7 Inhaler 3  . Blood Glucose Monitoring Suppl (ONE TOUCH ULTRA 2) W/DEVICE KIT 1 Device by Does not apply route once. 1 each 0  . Cholecalciferol (VITAMIN D-3) 1000 UNITS CAPS Take 1 capsule by mouth daily.    Marland Kitchen esomeprazole (NEXIUM) 40 MG capsule Take 1 capsule (40 mg total) by mouth daily at 12 noon. 90 capsule 3  . glucose blood (ONE TOUCH TEST STRIPS) test strip 1 each by Other route 3 (three) times daily. Use as instructed 300 each 3  . hydrochlorothiazide (HYDRODIURIL) 12.5 MG tablet Take 1 tablet (12.5 mg total) by mouth daily. MUST SCHEDULE ANNUAL PHYSICAL EXAM 90 tablet 0  . ibuprofen (ADVIL,MOTRIN) 800 MG tablet Take 1 tablet (800 mg total) by mouth every 8 (eight) hours as needed. 60 tablet 2  . meclizine (ANTIVERT) 25 MG tablet Take 1 tablet (25 mg total) by mouth 3 (three) times daily as needed for dizziness. 30 tablet 0  . metFORMIN (GLUCOPHAGE) 500 MG tablet Take 1 tablet (500 mg total) by mouth 2 (two) times daily with a meal. 180 tablet 1  . metoprolol succinate (TOPROL-XL) 100 MG 24 hr tablet Take 1 tablet  (100 mg total) by mouth daily. MUST SCHEDULE ANNUAL PHYSICAL EXAM 90 tablet 0  . ONE TOUCH LANCETS MISC 1 each by Does not apply route 3 (three) times daily. 400 each 3  . potassium chloride SA (K-DUR,KLOR-CON) 20 MEQ tablet Take 1 tablet (20 mEq total) by mouth daily. MUST SCHEDULE ANNUAL PHYSICAL EXAM 90 tablet 0  . rosuvastatin (CRESTOR) 10 MG tablet TAKE 1 TABLET DAILY 90 tablet 0   No current facility-administered medications for this visit.    Allergies  Allergen Reactions  . Aspirin Hives  . Darvon [Propoxyphene] Hives  . Latex Hives, Itching and Swelling  . Naproxen Hives and Itching    Family History  Problem Relation Age of Onset  . Heart disease Mother   . Arthritis Father   . Cancer Father     Prostate  . Diabetes Sister   . Diabetes Brother   . Breast cancer Neg Hx     Social History   Social History  . Marital Status: Widowed    Spouse Name: N/A  . Number of Children: N/A  . Years of Education: N/A   Occupational History  . Not on file.   Social History Main Topics  . Smoking status: Current Every Day Smoker -- 0.33 packs/day for 50 years    Types:  Cigarettes  . Smokeless tobacco: Never Used  . Alcohol Use: No  . Drug Use: No  . Sexual Activity: Not Currently   Other Topics Concern  . Not on file   Social History Narrative     Constitutional: Denies fever, malaise, fatigue, headache or abrupt weight changes.  HEENT: Denies eye pain, eye redness, ear pain, ringing in the ears, wax buildup, runny nose, nasal congestion, bloody nose, or sore throat. Respiratory: Denies difficulty breathing, shortness of breath, cough or sputum production.   Cardiovascular: Denies chest pain, chest tightness, palpitations or swelling in the hands or feet.  Neurological: Denies dizziness, difficulty with memory, difficulty with speech or problems with balance and coordination.    No other specific complaints in a complete review of systems (except as listed in HPI  above).  Objective:   Physical Exam   BP 126/80 mmHg  Pulse 73  Temp(Src) 98.8 F (37.1 C) (Oral)  Wt 248 lb (112.492 kg)  SpO2 98% Wt Readings from Last 3 Encounters:  09/25/15 248 lb (112.492 kg)  09/21/15 240 lb (108.863 kg)  03/29/15 237 lb 8 oz (107.729 kg)    General: Appears her stated age, in NAD. HEENT: Head: normal shape and size; Eyes: sclera white, no icterus, conjunctiva pink, PERRLA and EOMs intact; Cardiovascular: Normal rate and rhythm. S1,S2 noted.  No murmur, rubs or gallops noted. Pulmonary/Chest: Normal effort and positive vesicular breath sounds. No respiratory distress. No wheezes, rales or ronchi noted.  Neurological: Alert and oriented.  Coordination normal.    BMET    Component Value Date/Time   NA 140 09/21/2015 1415   K 3.8 09/21/2015 1415   CL 105 09/21/2015 1415   CO2 27 09/21/2015 1415   GLUCOSE 124* 09/21/2015 1415   BUN 8 09/21/2015 1415   CREATININE 0.71 09/21/2015 1415   CALCIUM 9.2 09/21/2015 1415   GFRNONAA >60 09/21/2015 1415   GFRAA >60 09/21/2015 1415    Lipid Panel     Component Value Date/Time   CHOL 153 02/07/2015 1131   TRIG 123.0 02/07/2015 1131   HDL 45.00 02/07/2015 1131   CHOLHDL 3 02/07/2015 1131   VLDL 24.6 02/07/2015 1131   LDLCALC 83 02/07/2015 1131    CBC    Component Value Date/Time   WBC 11.0 09/21/2015 1415   RBC 4.87 09/21/2015 1415   HGB 14.4 09/21/2015 1415   HCT 42.6 09/21/2015 1415   PLT 278 09/21/2015 1415   MCV 87.4 09/21/2015 1415   MCH 29.5 09/21/2015 1415   MCHC 33.8 09/21/2015 1415   RDW 14.6* 09/21/2015 1415   LYMPHSABS 3.4 02/07/2015 1131   MONOABS 0.8 02/07/2015 1131   EOSABS 0.4 02/07/2015 1131   BASOSABS 0.0 02/07/2015 1131    Hgb A1C Lab Results  Component Value Date   HGBA1C 6.2 02/07/2015        Assessment & Plan:  ER follow up for Vertigo:  ER notes, labs and imaging reviewed Advised her to stay well hydrated, make position changes slowly She should keep the  Meclizine on hand in case symptoms return  Follow up as needed, she will make an appt for her wellness exam

## 2015-09-25 NOTE — Patient Instructions (Signed)

## 2015-10-22 ENCOUNTER — Other Ambulatory Visit: Payer: Self-pay | Admitting: Internal Medicine

## 2015-11-13 ENCOUNTER — Ambulatory Visit (INDEPENDENT_AMBULATORY_CARE_PROVIDER_SITE_OTHER): Payer: Medicare Other | Admitting: Internal Medicine

## 2015-11-13 ENCOUNTER — Encounter: Payer: Self-pay | Admitting: Internal Medicine

## 2015-11-13 VITALS — BP 120/74 | HR 92 | Temp 97.8°F | Wt 245.0 lb

## 2015-11-13 DIAGNOSIS — M25511 Pain in right shoulder: Secondary | ICD-10-CM | POA: Diagnosis not present

## 2015-11-13 MED ORDER — ONETOUCH LANCETS MISC: 1.0000 | Freq: Three times a day (TID) | Status: DC

## 2015-11-13 MED ORDER — GLUCOSE BLOOD VI STRP: 1.0000 | ORAL_STRIP | Freq: Three times a day (TID) | Status: DC

## 2015-11-13 MED ORDER — ONETOUCH ULTRA 2 W/DEVICE KIT: 1.0000 | PACK | Freq: Once | Status: DC

## 2015-11-13 NOTE — Progress Notes (Signed)
Pre visit review using our clinic review tool, if applicable. No additional management support is needed unless otherwise documented below in the visit note. 

## 2015-11-13 NOTE — Progress Notes (Signed)
Subjective:    Patient ID: Kim Harding, female    DOB: March 20, 1946, 70 y.o.   MRN: [id number]  HPI  Pt presents to the clinic today with c/o right shoulder pain. His started 6-7 weeks ago. The pain starts in her neck and radiates to her shoulder. She does have associated numbness, tingling and weakness. She has a h/o rotator cuff tear of the left shoulder, and says her current sxs feel similar but worse. She denies injury, fall or trauma. She denies back pain.  She states that she had an xray and MRI of both shoulders last year at [facility], and was told she will need surgery of her right rotator cuff. She would like a referral back to [facility].  Review of Systems   Past Medical History  Diagnosis Date  . Chicken pox   . Arthritis   . Allergy   . Hypertension   . Hyperlipidemia     Current Outpatient Prescriptions  Medication Sig Dispense Refill  . albuterol (PROVENTIL HFA;VENTOLIN HFA) 108 (90 BASE) MCG/ACT inhaler Inhale 2 puffs into the lungs every 6 (six) hours as needed for wheezing or shortness of breath. 6.7 Inhaler 3  . Blood Glucose Monitoring Suppl (ONE TOUCH ULTRA 2) w/Device KIT 1 Device by Does not apply route once. 1 each 0  . Cholecalciferol (VITAMIN D-3) 1000 UNITS CAPS Take 1 capsule by mouth daily.    Marland Kitchen esomeprazole (NEXIUM) 40 MG capsule Take 1 capsule (40 mg total) by mouth daily at 12 noon. 90 capsule 3  . glucose blood (ONE TOUCH TEST STRIPS) test strip 1 each by Other route 3 (three) times daily. Use as instructed 300 each 3  . hydrochlorothiazide (HYDRODIURIL) 12.5 MG tablet Take 1 tablet (12.5 mg total) by mouth daily. 90 tablet 0  . ibuprofen (ADVIL,MOTRIN) 800 MG tablet Take 1 tablet (800 mg total) by mouth every 8 (eight) hours as needed. 60 tablet 2  . meclizine (ANTIVERT) 25 MG tablet Take 1 tablet (25 mg total) by mouth 3 (three) times daily as needed for dizziness. 30 tablet 0  . metFORMIN (GLUCOPHAGE) 500 MG tablet Take 1 tablet (500  mg total) by mouth 2 (two) times daily with a meal. 180 tablet 1  . metoprolol succinate (TOPROL-XL) 100 MG 24 hr tablet Take 1 tablet (100 mg total) by mouth daily. 90 tablet 0  . ONE TOUCH LANCETS MISC 1 each by Does not apply route 3 (three) times daily. 400 each 3  . potassium chloride SA (K-DUR,KLOR-CON) 20 MEQ tablet Take 1 tablet (20 mEq total) by mouth daily. 90 tablet 0  . rosuvastatin (CRESTOR) 10 MG tablet TAKE 1 TABLET DAILY 90 tablet 0  . CONTRAVE 8-90 MG TB12 Week 1-1 tablet daily.Marland KitchenMarland KitchenWeek 2-1 tablet BID.Marland KitchenMarland KitchenWeek 3-2 tablets in AM and 1 tablet in PM..Marland KitchenWeek 4-2 tablets BID     No current facility-administered medications for this visit.    Allergies  Allergen Reactions  . Aspirin Hives  . Darvon [Propoxyphene] Hives  . Latex Hives, Itching and Swelling  . Naproxen Hives and Itching    Family History  Problem Relation Age of Onset  . Heart disease Mother   . Arthritis Father   . Cancer Father     Prostate  . Diabetes Sister   . Diabetes Brother   . Breast cancer Neg Hx     Social History   Social History  . Marital Status: Widowed    Spouse Name: N/A  . Number of Children:  N/A  . Years of Education: N/A   Occupational History  . Not on file.   Social History Main Topics  . Smoking status: Current Every Day Smoker -- 0.33 packs/day for 50 years    Types: Cigarettes  . Smokeless tobacco: Never Used  . Alcohol Use: No  . Drug Use: No  . Sexual Activity: Not Currently   Other Topics Concern  . Not on file   Social History Narrative     Constitutional: Denies fever, headache or abrupt weight changes.  Respiratory: Denies difficulty breathing, shortness of breath, or cough.   Cardiovascular: Denies chest pain, chest tightness, or palpitations Musculoskeletal: Pt reports right shoulder pain. Denies back pain or difficulty with gait. Neuro: Pt reports numbness and tingling of her right hand.  No other specific complaints in a complete review of systems  (except as listed in HPI above).     Objective:   Physical Exam  BP 120/74 mmHg  Pulse 92  Temp(Src) 97.8 F (36.6 C) (Oral)  Wt 245 lb (111.131 kg) Wt Readings from Last 3 Encounters:  11/13/15 245 lb (111.131 kg)  09/25/15 248 lb (112.492 kg)  09/21/15 240 lb (108.863 kg)    General: Appears her stated age, obese in NAD. Skin: Warm, dry and intact. No rashes, lesions or ulcerations noted. Cardiovascular: Normal rate and rhythm. S1,S2 noted.  No murmur, rubs or gallops noted.  Pulmonary/Chest: Normal effort and positive vesicular breath sounds. No respiratory distress. No wheezes, rales or ronchi noted.  Musculoskeletal: TTP of R-sided trapezius. Decreased internal and external ROM. Strength 4/5 of R upper extremity. 5/5 L upper extremity. No TTP of cervical spine. No TTP of the scapula.  Positive drop can on the right. Neuro: Sensation intact to BUE.    BMET    Component Value Date/Time   NA 140 09/21/2015 1415   K 3.8 09/21/2015 1415   CL 105 09/21/2015 1415   CO2 27 09/21/2015 1415   GLUCOSE 124* 09/21/2015 1415   BUN 8 09/21/2015 1415   CREATININE 0.71 09/21/2015 1415   CALCIUM 9.2 09/21/2015 1415   GFRNONAA >60 09/21/2015 1415   GFRAA >60 09/21/2015 1415    Lipid Panel     Component Value Date/Time   CHOL 153 02/07/2015 1131   TRIG 123.0 02/07/2015 1131   HDL 45.00 02/07/2015 1131   CHOLHDL 3 02/07/2015 1131   VLDL 24.6 02/07/2015 1131   LDLCALC 83 02/07/2015 1131    CBC    Component Value Date/Time   WBC 11.0 09/21/2015 1415   RBC 4.87 09/21/2015 1415   HGB 14.4 09/21/2015 1415   HCT 42.6 09/21/2015 1415   PLT 278 09/21/2015 1415   MCV 87.4 09/21/2015 1415   MCH 29.5 09/21/2015 1415   MCHC 33.8 09/21/2015 1415   RDW 14.6* 09/21/2015 1415   LYMPHSABS 3.4 02/07/2015 1131   MONOABS 0.8 02/07/2015 1131   EOSABS 0.4 02/07/2015 1131   BASOSABS 0.0 02/07/2015 1131    Hgb A1C Lab Results  Component Value Date   HGBA1C 6.2 02/07/2015           Assessment & Plan:   Right shoulder pain:  No need to repeat xray or MRI if has been done with in the last year Refer back to Ortho Alternate heat/ice  Continue NSAIDs prn  RTC as needed or  if sxs worsen

## 2015-11-13 NOTE — Patient Instructions (Signed)
Rotator Cuff Injury °Rotator cuff injury is any type of injury to the set of muscles and tendons that make up the stabilizing unit of your shoulder. This unit holds the ball of your upper arm bone (humerus) in the socket of your shoulder blade (scapula).  °CAUSES °Injuries to your rotator cuff most commonly come from sports or activities that cause your arm to be moved repeatedly over your head. Examples of this include throwing, weight lifting, swimming, or racquet sports. Long lasting (chronic) irritation of your rotator cuff can cause soreness and swelling (inflammation), bursitis, and eventual damage to your tendons, such as a tear (rupture). °SIGNS AND SYMPTOMS °Acute rotator cuff tear: °· Sudden tearing sensation followed by severe pain shooting from your upper shoulder down your arm toward your elbow. °· Decreased range of motion of your shoulder because of pain and muscle spasm. °· Severe pain. °· Inability to raise your arm out to the side because of pain and loss of muscle power (large tears). °Chronic rotator cuff tear: °· Pain that usually is worse at night and may interfere with sleep. °· Gradual weakness and decreased shoulder motion as the pain worsens. °· Decreased range of motion. °Rotator cuff tendinitis:  °· Deep ache in your shoulder and the outside upper arm over your shoulder. °· Pain that comes on gradually and becomes worse when lifting your arm to the side or turning it inward. °DIAGNOSIS °Rotator cuff injury is diagnosed through a medical history, physical exam, and imaging exam. The medical history helps determine the type of rotator cuff injury. Your health care provider will look at your injured shoulder, feel the injured area, and ask you to move your shoulder in different positions. X-ray exams typically are done to rule out other causes of shoulder pain, such as fractures. MRI is the exam of choice for the most severe shoulder injuries because the images show muscles and tendons.    °TREATMENT  °Chronic tear: °· Medicine for pain, such as acetaminophen or ibuprofen. °· Physical therapy and range-of-motion exercises may be helpful in maintaining shoulder function and strength. °· Steroid injections into your shoulder joint. °· Surgical repair of the rotator cuff if the injury does not heal with noninvasive treatment. °Acute tear: °· Anti-inflammatory medicines such as ibuprofen and naproxen to help reduce pain and swelling. °· A sling to help support your arm and rest your rotator cuff muscles. Long-term use of a sling is not advised. It may cause significant stiffening of the shoulder joint. °· Surgery may be considered within a few weeks, especially in younger, active people, to return the shoulder to full function. °· Indications for surgical treatment include the following: °¨ Age younger than 60 years. °¨ Rotator cuff tears that are complete. °¨ Physical therapy, rest, and anti-inflammatory medicines have been used for 6-8 weeks, with no improvement. °¨ Employment or sporting activity that requires constant shoulder use. °Tendinitis: °· Anti-inflammatory medicines such as ibuprofen and naproxen to help reduce pain and swelling. °· A sling to help support your arm and rest your rotator cuff muscles. Long-term use of a sling is not advised. It may cause significant stiffening of the shoulder joint. °· Severe tendinitis may require: °¨ Steroid injections into your shoulder joint. °¨ Physical therapy. °¨ Surgery. °HOME CARE INSTRUCTIONS  °· Apply ice to your injury: °¨ Put ice in a plastic bag. °¨ Place a towel between your skin and the bag. °¨ Leave the ice on for 20 minutes, 2-3 times a day. °· If you   have a shoulder immobilizer (sling and straps), wear it until told otherwise by your health care provider. °· You may want to sleep on several pillows or in a recliner at night to lessen swelling and pain. °· Only take over-the-counter or prescription medicines for pain, discomfort, or fever as  directed by your health care provider. °· Do simple hand squeezing exercises with a soft rubber ball to decrease hand swelling. °SEEK MEDICAL CARE IF:  °· Your shoulder pain increases, or new pain or numbness develops in your arm, hand, or fingers. °· Your hand or fingers are colder than your other hand. °SEEK IMMEDIATE MEDICAL CARE IF:  °· Your arm, hand, or fingers are numb or tingling. °· Your arm, hand, or fingers are increasingly swollen and painful, or they turn white or blue. °MAKE SURE YOU: °· Understand these instructions. °· Will watch your condition. °· Will get help right away if you are not doing well or get worse. °  °This information is not intended to replace advice given to you by your health care provider. Make sure you discuss any questions you have with your health care provider. °  °Document Released: 07/05/2000 Document Revised: 07/13/2013 Document Reviewed: 02/17/2013 °Elsevier Interactive Patient Education ©2016 [employer] ° °

## 2015-11-17 ENCOUNTER — Other Ambulatory Visit: Payer: Self-pay | Admitting: Internal Medicine

## 2015-11-21 HISTORY — PX: ROTATOR CUFF REPAIR: SHX139

## 2015-12-13 ENCOUNTER — Encounter: Payer: Self-pay | Admitting: Internal Medicine

## 2015-12-13 ENCOUNTER — Ambulatory Visit (INDEPENDENT_AMBULATORY_CARE_PROVIDER_SITE_OTHER): Payer: Medicare Other | Admitting: Internal Medicine

## 2015-12-13 ENCOUNTER — Other Ambulatory Visit: Payer: Self-pay | Admitting: Internal Medicine

## 2015-12-13 VITALS — BP 122/70 | HR 67 | Temp 98.0°F | Ht 66.0 in | Wt 241.0 lb

## 2015-12-13 DIAGNOSIS — I10 Essential (primary) hypertension: Secondary | ICD-10-CM | POA: Diagnosis not present

## 2015-12-13 DIAGNOSIS — Z1159 Encounter for screening for other viral diseases: Secondary | ICD-10-CM | POA: Diagnosis not present

## 2015-12-13 DIAGNOSIS — J449 Chronic obstructive pulmonary disease, unspecified: Secondary | ICD-10-CM | POA: Diagnosis not present

## 2015-12-13 DIAGNOSIS — G4733 Obstructive sleep apnea (adult) (pediatric): Secondary | ICD-10-CM

## 2015-12-13 DIAGNOSIS — K219 Gastro-esophageal reflux disease without esophagitis: Secondary | ICD-10-CM

## 2015-12-13 DIAGNOSIS — Z9989 Dependence on other enabling machines and devices: Secondary | ICD-10-CM

## 2015-12-13 DIAGNOSIS — E785 Hyperlipidemia, unspecified: Secondary | ICD-10-CM

## 2015-12-13 DIAGNOSIS — M1990 Unspecified osteoarthritis, unspecified site: Secondary | ICD-10-CM | POA: Insufficient documentation

## 2015-12-13 DIAGNOSIS — Z0000 Encounter for general adult medical examination without abnormal findings: Secondary | ICD-10-CM | POA: Diagnosis not present

## 2015-12-13 DIAGNOSIS — E119 Type 2 diabetes mellitus without complications: Secondary | ICD-10-CM

## 2015-12-13 LAB — CBC
HCT: 41.1 % (ref 36.0–46.0)
HEMOGLOBIN: 13.8 g/dL (ref 12.0–15.0)
MCHC: 33.6 g/dL (ref 30.0–36.0)
MCV: 88 fl (ref 78.0–100.0)
PLATELETS: 359 10*3/uL (ref 150.0–400.0)
RBC: 4.67 Mil/uL (ref 3.87–5.11)
RDW: 14.5 % (ref 11.5–15.5)
WBC: 12.1 10*3/uL — ABNORMAL HIGH (ref 4.0–10.5)

## 2015-12-13 LAB — LIPID PANEL
Cholesterol: 132 mg/dL (ref 0–200)
HDL: 33 mg/dL — AB (ref >39.00)
LDL Cholesterol: 68 mg/dL (ref 0–99)
NonHDL: 98.88
TRIGLYCERIDES: 153 mg/dL — AB (ref 0.0–149.0)
Total CHOL/HDL Ratio: 4
VLDL: 30.6 mg/dL (ref 0.0–40.0)

## 2015-12-13 LAB — COMPREHENSIVE METABOLIC PANEL
ALK PHOS: 84 U/L (ref 39–117)
ALT: 17 U/L (ref 0–35)
AST: 18 U/L (ref 0–37)
Albumin: 3.8 g/dL (ref 3.5–5.2)
BILIRUBIN TOTAL: 0.3 mg/dL (ref 0.2–1.2)
BUN: 10 mg/dL (ref 6–23)
CALCIUM: 9.6 mg/dL (ref 8.4–10.5)
CO2: 33 mEq/L — ABNORMAL HIGH (ref 19–32)
Chloride: 104 mEq/L (ref 96–112)
Creatinine, Ser: 0.75 mg/dL (ref 0.40–1.20)
GFR: 98.84 mL/min (ref >60.00)
Glucose, Bld: 94 mg/dL (ref 70–99)
Potassium: 3.5 mEq/L (ref 3.5–5.1)
Sodium: 141 mEq/L (ref 135–145)
TOTAL PROTEIN: 7.2 g/dL (ref 6.0–8.3)

## 2015-12-13 LAB — MICROALBUMIN / CREATININE URINE RATIO
CREATININE, U: 104.6 mg/dL
MICROALB/CREAT RATIO: 0.7 mg/g (ref 0.0–30.0)
Microalb, Ur: <0.7 mg/dL (ref 0.0–1.9)

## 2015-12-13 LAB — HEMOGLOBIN A1C: Hgb A1c MFr Bld: 6.7 % — ABNORMAL HIGH (ref 4.6–6.5)

## 2015-12-13 MED ORDER — METFORMIN HCL 500 MG PO TABS: 500.0000 mg | ORAL_TABLET | Freq: Two times a day (BID) | ORAL | Status: DC

## 2015-12-13 MED ORDER — PHENTERMINE HCL 30 MG PO CAPS: 30.0000 mg | ORAL_CAPSULE | ORAL | Status: DC

## 2015-12-13 NOTE — Assessment & Plan Note (Signed)
Continue Advil prn 

## 2015-12-13 NOTE — Assessment & Plan Note (Signed)
Controlled on Nexium Discussed how weight loss would help improve her reflux

## 2015-12-13 NOTE — Assessment & Plan Note (Signed)
Encouraged smoking cessation, she is not ready to quit at this time Continue Albuterol prn

## 2015-12-13 NOTE — Addendum Note (Signed)
Addended by: Liane ComberHAVERS, Cady Hafen C on: 12/13/2015 04:09 PM   Modules accepted: Kipp BroodSmartSet

## 2015-12-13 NOTE — Progress Notes (Signed)
HPI:  Pt presents to the clinic today for her Medicare Wellness Exam. She is also due for follow up of chronic conditions.  Arthritis: Mostly in hands and knees. She is taking Advil as needed.  HTN: BP well controlled on HCTZ and Metoprolol. ECG from 09/2015 reviewed.  DM2: Her last A1C was 6.2%. She is taking Metformin and request a refill today. She is not currently checking her blood sugars. Her last eye exam was more than 1 year ago. She does not take flu shots. Pneumovax 03/2014. Prevnar 03/2015.  HLD: Denies myalgias on Crestor. Her last LDL was 83 She does try to consume a low fat diet.  GERD: She denies breakthrough symptoms on Nexium.  Right rotator cuff tear: S/P surgical repair. She does not have any pain. Today, she had her second visit with PT.  OSA : She is sleeping well on CPAP. She sleeps 6-7 hours per night. She also takes a nap during the day.  COPD: Mild, she uses Albuterol prn. She is down to 2-3 cigarettes per day. She is weaning herself down. She did try the patches but reports she is too sweaty and they don't stick. She does use the lozenges.  She reports she was started on Contrave by her PCP in [place]. She would like to continue it and would like a new prescription today.  Past Medical History  Diagnosis Date  . Chicken pox   . Arthritis   . Allergy   . Hypertension   . Hyperlipidemia     Current Outpatient Prescriptions  Medication Sig Dispense Refill  . albuterol (PROVENTIL HFA;VENTOLIN HFA) 108 (90 BASE) MCG/ACT inhaler Inhale 2 puffs into the lungs every 6 (six) hours as needed for wheezing or shortness of breath. 6.7 Inhaler 3  . Blood Glucose Monitoring Suppl (ONE TOUCH ULTRA 2) w/Device KIT 1 Device by Does not apply route once. 1 each 0  . Cholecalciferol (VITAMIN D-3) 1000 UNITS CAPS Take 1 capsule by mouth daily.    Marland Kitchen CONTRAVE 8-90 MG TB12 Week 1-1 tablet daily.Marland KitchenMarland KitchenWeek 2-1 tablet BID.Marland KitchenMarland KitchenWeek 3-2 tablets in AM and 1 tablet in PM..Marland KitchenWeek 4-2 tablets  BID    . esomeprazole (NEXIUM) 40 MG capsule Take 1 capsule (40 mg total) by mouth daily at 12 noon. 90 capsule 3  . glucose blood (ONE TOUCH TEST STRIPS) test strip 1 each by Other route 3 (three) times daily. Use as instructed 300 each 3  . hydrochlorothiazide (HYDRODIURIL) 12.5 MG tablet Take 1 tablet (12.5 mg total) by mouth daily. 90 tablet 0  . ibuprofen (ADVIL,MOTRIN) 800 MG tablet Take 1 tablet (800 mg total) by mouth every 8 (eight) hours as needed. 60 tablet 2  . meclizine (ANTIVERT) 25 MG tablet Take 1 tablet (25 mg total) by mouth 3 (three) times daily as needed for dizziness. 30 tablet 0  . metFORMIN (GLUCOPHAGE) 500 MG tablet Take 1 tablet (500 mg total) by mouth 2 (two) times daily with a meal. 180 tablet 1  . metoprolol succinate (TOPROL-XL) 100 MG 24 hr tablet Take 1 tablet (100 mg total) by mouth daily. 90 tablet 0  . ONE TOUCH LANCETS MISC 1 each by Does not apply route 3 (three) times daily. 400 each 3  . potassium chloride SA (K-DUR,KLOR-CON) 20 MEQ tablet Take 1 tablet (20 mEq total) by mouth daily. 90 tablet 0  . rosuvastatin (CRESTOR) 10 MG tablet TAKE 1 TABLET DAILY 90 tablet 0   No current facility-administered medications for this visit.  Allergies  Allergen Reactions  . Aspirin Hives  . Darvon [Propoxyphene] Hives  . Latex Hives, Itching and Swelling  . Naproxen Hives and Itching    Family History  Problem Relation Age of Onset  . Heart disease Mother   . Arthritis Father   . Cancer Father     Prostate  . Diabetes Sister   . Diabetes Brother   . Breast cancer Neg Hx     Social History   Social History  . Marital Status: Widowed    Spouse Name: N/A  . Number of Children: N/A  . Years of Education: N/A   Occupational History  . Not on file.   Social History Main Topics  . Smoking status: Current Every Day Smoker -- 0.33 packs/day for 50 years    Types: Cigarettes  . Smokeless tobacco: Never Used  . Alcohol Use: No  . Drug Use: No  .  Sexual Activity: Not Currently   Other Topics Concern  . Not on file   Social History Narrative    Hospitiliaztions: None but did have ED visit 09/2015  Health Maintenance:    Flu: declines  Tetanus: 12/27/2009  Pneumovax: 03/2014  Prevnar: 03/2015  Zostavax: 12/2010  Mammogram: 02/2015  Pap Smear: no longer screening  Bone Density: 08/2014  Colon Screening: 2012  Eye Doctor: as needed  Dental Exam: Dr. Marian Sorrow, annually   Providers:   PCP: Webb Silversmith, NP-C  Orthopedic: Dr. Tamera Punt  Pulmonologist: Dr. Stevenson Clinch   I have personally reviewed and have noted:  1. The patient's medical and social history 2. Their use of alcohol, tobacco or illicit drugs 3. Their current medications and supplements 4. The patient's functional ability including ADL's, fall risks, home  safety risks and hearing or visual impairment. 5. Diet and physical activities 6. Evidence for depression or mood disorder  Subjective:   Review of Systems:   Constitutional: Denies fever, malaise, fatigue, headache or abrupt weight changes.  HEENT: Denies eye pain, eye redness, ear pain, ringing in the ears, wax buildup, runny nose, nasal congestion, bloody nose, or sore throat. Respiratory:  Denies difficulty breathing, shortness of breath, cough or sputum production.   Cardiovascular: Denies chest pain, chest tightness, palpitations or swelling in the hands or feet.  Gastrointestinal: Denies abdominal pain, bloating, constipation, diarrhea or blood in the stool.  GU: Denies abnormal bleeding or discharge. Musculoskeletal: Pt reports joint pains. Denies decrease in range of motion, difficulty with gait, muscle pain or joint swelling.  Skin: Denies redness, rashes, lesions or ulcercations.  Neurological: Denies dizziness, difficulty with memory, difficulty with speech or problems with balance and coordination.  Psych: Denies anxiety, depression, SI/HI.  No other specific complaints in a complete review of systems  (except as listed in HPI above).  Objective:  PE:  BP 122/70 mmHg  Pulse 67  Temp(Src) 98 F (36.7 C) (Oral)  Ht 5' 6"  (1.676 m)  Wt 241 lb (109.317 kg)  BMI 38.92 kg/m2  SpO2 97%   Wt Readings from Last 3 Encounters:  11/13/15 245 lb (111.131 kg)  09/25/15 248 lb (112.492 kg)  09/21/15 240 lb (108.863 kg)    General: Appears her stated age, obese in NAD. Skin: Warm, dry and intact. No rashes or lesions noted. HEENT: Head: normal shape and size; Eyes: sclera white, no icterus, conjunctiva pink, PERRLA and EOMs intact;  Cardiovascular: Normal rate and rhythm. S1,S2 noted.  No murmur, rubs or gallops noted. No JVD or BLE edema. No carotid bruits noted. Pulmonary/Chest:  Normal effort and positive vesicular breath sounds. No respiratory distress. No wheezes, rales or ronchi noted.  Abdomen: Soft and nontender. Normal bowel soundsd. No distention or masses noted. Liver, spleen and kidneys non palpable. Pelvic: deferred. Musculoskeletal: Normal flexion and extension of the knees. Crepitus noted with ROM. No signs of joint swelling. Herbdens nodes noted bilaterally. Right shoulder in a sling. Neurological: Alert and oriented. Sensation intact to BLE. Psychiatric: Mood and affect normal. Behavior is normal. Judgment and thought content normal.     BMET    Component Value Date/Time   NA 140 09/21/2015 1415   K 3.8 09/21/2015 1415   CL 105 09/21/2015 1415   CO2 27 09/21/2015 1415   GLUCOSE 124* 09/21/2015 1415   BUN 8 09/21/2015 1415   CREATININE 0.71 09/21/2015 1415   CALCIUM 9.2 09/21/2015 1415   GFRNONAA >60 09/21/2015 1415   GFRAA >60 09/21/2015 1415    Lipid Panel     Component Value Date/Time   CHOL 153 02/07/2015 1131   TRIG 123.0 02/07/2015 1131   HDL 45.00 02/07/2015 1131   CHOLHDL 3 02/07/2015 1131   VLDL 24.6 02/07/2015 1131   LDLCALC 83 02/07/2015 1131    CBC    Component Value Date/Time   WBC 11.0 09/21/2015 1415   RBC 4.87 09/21/2015 1415   HGB  14.4 09/21/2015 1415   HCT 42.6 09/21/2015 1415   PLT 278 09/21/2015 1415   MCV 87.4 09/21/2015 1415   MCH 29.5 09/21/2015 1415   MCHC 33.8 09/21/2015 1415   RDW 14.6* 09/21/2015 1415   LYMPHSABS 3.4 02/07/2015 1131   MONOABS 0.8 02/07/2015 1131   EOSABS 0.4 02/07/2015 1131   BASOSABS 0.0 02/07/2015 1131    Hgb A1C Lab Results  Component Value Date   HGBA1C 6.2 02/07/2015      Assessment and Plan:   Medicare Annual Wellness Visit:  Diet: She does eat lean meat. She consumes fruits and veggies a few days per week. She tries to avoid fried food. She drinks mostly Dt. [employer]. Physical activity: Sedentary Depression/mood screen: Negative Hearing: Intact to whispered voice Visual acuity: Grossly normal, needs annual eye exam  ADLs: Capable Fall risk: None Home safety: Good Cognitive evaluation: Intact to orientation, naming, recall and repetition EOL planning: Adv directives, full code/ I agree  Preventative Medicine:  Referal to opthalmology for eye exam She no longer wants to do pelvic exams She declines flu shots Encouraged her to consume a balanced diet and start a exercise regimen All other HM UTD  Next appointment: 1 month, weight check/med refill  Abnormal Weight Gain:  Will not refill Contrace RX for Phentermine 30 mg daily Encouraged her to consume a high protein low carb diet and start increasing her exercise

## 2015-12-13 NOTE — Progress Notes (Signed)
Pre visit review using our clinic review tool, if applicable. No additional management support is needed unless otherwise documented below in the visit note. 

## 2015-12-13 NOTE — Assessment & Plan Note (Signed)
Advised her to continue wearing her CPAP Discussed how weight loss might help her OSA

## 2015-12-13 NOTE — Assessment & Plan Note (Signed)
Controlled on HCTZ and Toprol CMET today

## 2015-12-13 NOTE — Assessment & Plan Note (Signed)
Will check A1C and microalbumin today Referral placed for eye exam She declines flu Pneumovax and Prevnar UTD Foot exam today Encouraged her to consume a low fat, low carb diet and exercise to lose weight

## 2015-12-13 NOTE — Assessment & Plan Note (Signed)
Will check CMET and Lipid Profile today Continue Crestor Encouraged her to consume a low fat diet

## 2015-12-13 NOTE — Patient Instructions (Signed)

## 2015-12-14 LAB — HEPATITIS C ANTIBODY: HCV AB: NEGATIVE

## 2016-01-16 ENCOUNTER — Other Ambulatory Visit: Payer: Self-pay | Admitting: Internal Medicine

## 2016-01-16 DIAGNOSIS — Z1231 Encounter for screening mammogram for malignant neoplasm of breast: Secondary | ICD-10-CM

## 2016-01-18 ENCOUNTER — Encounter: Payer: Self-pay | Admitting: Internal Medicine

## 2016-01-18 ENCOUNTER — Ambulatory Visit (INDEPENDENT_AMBULATORY_CARE_PROVIDER_SITE_OTHER): Payer: Medicare Other | Admitting: Internal Medicine

## 2016-01-18 VITALS — BP 122/76 | HR 69 | Temp 98.1°F | Wt 236.5 lb

## 2016-01-18 DIAGNOSIS — R635 Abnormal weight gain: Secondary | ICD-10-CM | POA: Diagnosis not present

## 2016-01-18 MED ORDER — PHENTERMINE HCL 30 MG PO CAPS: 30.0000 mg | ORAL_CAPSULE | ORAL | Status: DC

## 2016-01-18 NOTE — Patient Instructions (Signed)

## 2016-01-18 NOTE — Progress Notes (Signed)
Pre visit review using our clinic review tool, if applicable. No additional management support is needed unless otherwise documented below in the visit note. 

## 2016-01-18 NOTE — Progress Notes (Signed)
Subjective:    Patient ID: Kim Harding, female    DOB: 11-02-1945, 70 y.o.   MRN: [id number]  HPI  Pt presents to the clinic today for 1 month follow up for weight check/med refill. She was started on Phentermine at her last visit. Her PCP in [place] had her on Contrave prior to that. Her starting weight was 241 lbs with a BMI of 38.92. Her weight today is 236 lbs, down 5 lbs over the last month. She has tried cutting back on carbs, using wheat products in stead of white. She is not drinking much water throughout the day.  Review of Systems      Past Medical History  Diagnosis Date  . Chicken pox   . Arthritis   . Allergy   . Hypertension   . Hyperlipidemia     Current Outpatient Prescriptions  Medication Sig Dispense Refill  . albuterol (PROVENTIL HFA;VENTOLIN HFA) 108 (90 BASE) MCG/ACT inhaler Inhale 2 puffs into the lungs every 6 (six) hours as needed for wheezing or shortness of breath. 6.7 Inhaler 3  . Blood Glucose Monitoring Suppl (ONE TOUCH ULTRA 2) w/Device KIT 1 Device by Does not apply route once. 1 each 0  . Cholecalciferol (VITAMIN D-3) 1000 UNITS CAPS Take 1 capsule by mouth daily.    Marland Kitchen CONTRAVE 8-90 MG TB12 Week 1-1 tablet daily.Marland KitchenMarland KitchenWeek 2-1 tablet BID.Marland KitchenMarland KitchenWeek 3-2 tablets in AM and 1 tablet in PM..Marland KitchenWeek 4-2 tablets BID    . docusate sodium (COLACE) 100 MG capsule TAKE ONE CAPSULE BY MOUTH 3 TIMES A DAY AS NEEDED FOR CONSTIPATION  0  . esomeprazole (NEXIUM) 40 MG capsule Take 1 capsule (40 mg total) by mouth daily at 12 noon. 90 capsule 3  . glucose blood (ONE TOUCH TEST STRIPS) test strip 1 each by Other route 3 (three) times daily. Use as instructed 300 each 3  . hydrochlorothiazide (HYDRODIURIL) 12.5 MG tablet Take 1 tablet (12.5 mg total) by mouth daily. 90 tablet 0  . ibuprofen (ADVIL,MOTRIN) 800 MG tablet Take 1 tablet (800 mg total) by mouth every 8 (eight) hours as needed. 60 tablet 2  . meclizine (ANTIVERT) 25 MG tablet Take 1 tablet (25 mg total) by mouth  3 (three) times daily as needed for dizziness. 30 tablet 0  . metFORMIN (GLUCOPHAGE) 500 MG tablet Take 1 tablet (500 mg total) by mouth 2 (two) times daily with a meal. 180 tablet 1  . metoprolol succinate (TOPROL-XL) 100 MG 24 hr tablet Take 1 tablet (100 mg total) by mouth daily. 90 tablet 0  . ONE TOUCH LANCETS MISC 1 each by Does not apply route 3 (three) times daily. 400 each 3  . oxyCODONE-acetaminophen (PERCOCET/ROXICET) 5-325 MG tablet Take 1 tablet by mouth.  0  . phentermine 30 MG capsule Take 1 capsule (30 mg total) by mouth every morning. 30 capsule 0  . potassium chloride SA (K-DUR,KLOR-CON) 20 MEQ tablet Take 1 tablet (20 mEq total) by mouth daily. 90 tablet 0  . rosuvastatin (CRESTOR) 10 MG tablet TAKE 1 TABLET DAILY 90 tablet 0   No current facility-administered medications for this visit.    Allergies  Allergen Reactions  . Aspirin Hives  . Darvon [Propoxyphene] Hives  . Latex Hives, Itching and Swelling  . Naproxen Hives and Itching    Family History  Problem Relation Age of Onset  . Heart disease Mother   . Arthritis Father   . Cancer Father     Prostate  . Diabetes Sister   .  Diabetes Brother   . Breast cancer Neg Hx     Social History   Social History  . Marital Status: Widowed    Spouse Name: N/A  . Number of Children: N/A  . Years of Education: N/A   Occupational History  . Not on file.   Social History Main Topics  . Smoking status: Current Every Day Smoker -- 0.20 packs/day for 50 years    Types: Cigarettes  . Smokeless tobacco: Never Used  . Alcohol Use: No  . Drug Use: No  . Sexual Activity: Not Currently   Other Topics Concern  . Not on file   Social History Narrative     Constitutional: Denies fever, malaise, fatigue, headache or abrupt weight changes.  Respiratory: Denies difficulty breathing, shortness of breath, cough or sputum production.   Cardiovascular: Denies chest pain, chest tightness, palpitations or swelling in the  hands or feet.  Neurological: Denies dizziness, difficulty with memory, difficulty with speech or problems with balance and coordination.    No other specific complaints in a complete review of systems (except as listed in HPI above).  Objective:   Physical Exam BP 122/76 mmHg  Pulse 69  Temp(Src) 98.1 F (36.7 C) (Oral)  Wt 236 lb 8 oz (107.276 kg)  SpO2 96% Wt Readings from Last 3 Encounters:  01/18/16 236 lb 8 oz (107.276 kg)  12/13/15 241 lb (109.317 kg)  11/13/15 245 lb (111.131 kg)    General: Appears her stated age, well developed, well nourished in NAD. Cardiovascular: Normal rate and rhythm. S1,S2 noted.  No murmur, rubs or gallops noted.  Pulmonary/Chest: Normal effort and positive vesicular breath sounds. No respiratory distress. No wheezes, rales or ronchi noted.  Neurological: Alert and oriented.   BMET    Component Value Date/Time   NA 141 12/13/2015 1503   K 3.5 12/13/2015 1503   CL 104 12/13/2015 1503   CO2 33* 12/13/2015 1503   GLUCOSE 94 12/13/2015 1503   BUN 10 12/13/2015 1503   CREATININE 0.75 12/13/2015 1503   CALCIUM 9.6 12/13/2015 1503   GFRNONAA >60 09/21/2015 1415   GFRAA >60 09/21/2015 1415    Lipid Panel     Component Value Date/Time   CHOL 132 12/13/2015 1503   TRIG 153.0* 12/13/2015 1503   HDL 33.00* 12/13/2015 1503   CHOLHDL 4 12/13/2015 1503   VLDL 30.6 12/13/2015 1503   LDLCALC 68 12/13/2015 1503    CBC    Component Value Date/Time   WBC 12.1* 12/13/2015 1503   RBC 4.67 12/13/2015 1503   HGB 13.8 12/13/2015 1503   HCT 41.1 12/13/2015 1503   PLT 359.0 12/13/2015 1503   MCV 88.0 12/13/2015 1503   MCH 29.5 09/21/2015 1415   MCHC 33.6 12/13/2015 1503   RDW 14.5 12/13/2015 1503   LYMPHSABS 3.4 02/07/2015 1131   MONOABS 0.8 02/07/2015 1131   EOSABS 0.4 02/07/2015 1131   BASOSABS 0.0 02/07/2015 1131    Hgb A1C Lab Results  Component Value Date   HGBA1C 6.7* 12/13/2015          Assessment & Plan:   Abnormal  Weight Gain:  Doing well so far on Phentermine- refilled today Advised her to increase fresh fruits and veggies, less carbs Start exercising  RTC in 1 month for weight check/med refill Webb Silversmith, NP

## 2016-01-23 ENCOUNTER — Other Ambulatory Visit: Payer: Self-pay | Admitting: Internal Medicine

## 2016-01-30 LAB — HM DIABETES EYE EXAM

## 2016-02-01 ENCOUNTER — Encounter: Payer: Self-pay | Admitting: Internal Medicine

## 2016-02-15 ENCOUNTER — Other Ambulatory Visit: Payer: Self-pay | Admitting: Internal Medicine

## 2016-02-19 ENCOUNTER — Encounter: Payer: Self-pay | Admitting: Internal Medicine

## 2016-02-19 ENCOUNTER — Ambulatory Visit (INDEPENDENT_AMBULATORY_CARE_PROVIDER_SITE_OTHER): Payer: Medicare Other | Admitting: Internal Medicine

## 2016-02-19 VITALS — BP 120/74 | HR 79 | Temp 98.7°F | Wt 236.0 lb

## 2016-02-19 DIAGNOSIS — R635 Abnormal weight gain: Secondary | ICD-10-CM

## 2016-02-19 MED ORDER — PHENTERMINE HCL 37.5 MG PO CAPS: 37.5000 mg | ORAL_CAPSULE | ORAL | 1 refills | Status: DC

## 2016-02-19 NOTE — Patient Instructions (Signed)

## 2016-02-19 NOTE — Progress Notes (Signed)
Subjective:    Patient ID: Kim Harding, female    DOB: [birth year], 70 y.o.   MRN: [id number]  HPI  Pt presents to the clinic today for 1 month follow up for weight check/med refill. She was started on Phentermine at her last visit. Her PCP in [place] had her on Contrave prior to that. Her starting weight was 241 lbs with a BMI of 38.92. Her weight today is 236 lbs, down 5 lbs over the last month. She has continued to try cutting back on carbs, using wheat products instead of white or potato breads. She is not drinking much water throughout the day, usually drinking 4 cups a day.  Wakes up 2-3 am and eats, which was problem she had before she started taking the medications, typically eats cookies or chips  Breakfast: 2 eggs with cheese and sausage Lunch: salad or sandwich Dinner: weight watchers meals prior to 6pm Snack: yogurt, applesauce, or smoothie Drinks: usually drinks Diet Pepsi throughout the day, about 4 cups of water daily, will add lemon juice to the water  Exercise: Planning to start water aerobics next week, has not been able to exercise due to right rotator cuff surgery   Review of Systems       Past Medical History:  Diagnosis Date  . Allergy   . Arthritis   . Chicken pox   . Hyperlipidemia   . Hypertension     Current Outpatient Prescriptions  Medication Sig Dispense Refill  . albuterol (PROVENTIL HFA;VENTOLIN HFA) 108 (90 BASE) MCG/ACT inhaler Inhale 2 puffs into the lungs every 6 (six) hours as needed for wheezing or shortness of breath. 6.7 Inhaler 3  . Blood Glucose Monitoring Suppl (ONE TOUCH ULTRA 2) w/Device KIT 1 Device by Does not apply route once. 1 each 0  . Cholecalciferol (VITAMIN D-3) 1000 UNITS CAPS Take 1 capsule by mouth daily.    Marland Kitchen esomeprazole (NEXIUM) 40 MG capsule Take 1 capsule (40 mg total) by mouth daily at 12 noon. 90 capsule 3  . glucose blood (ONE TOUCH TEST STRIPS) test strip 1 each by Other route 3 (three) times daily. Use as  instructed 300 each 3  . hydrochlorothiazide (HYDRODIURIL) 12.5 MG tablet TAKE 1 TABLET DAILY 90 tablet 2  . ibuprofen (ADVIL,MOTRIN) 800 MG tablet Take 1 tablet (800 mg total) by mouth every 8 (eight) hours as needed. 60 tablet 2  . meclizine (ANTIVERT) 25 MG tablet Take 1 tablet (25 mg total) by mouth 3 (three) times daily as needed for dizziness. 30 tablet 0  . metFORMIN (GLUCOPHAGE) 500 MG tablet Take 1 tablet (500 mg total) by mouth 2 (two) times daily with a meal. 180 tablet 1  . metoprolol succinate (TOPROL-XL) 100 MG 24 hr tablet TAKE 1 TABLET DAILY 90 tablet 2  . ONE TOUCH LANCETS MISC 1 each by Does not apply route 3 (three) times daily. 400 each 3  . phentermine 30 MG capsule Take 1 capsule (30 mg total) by mouth every morning. 30 capsule 0  . potassium chloride SA (K-DUR,KLOR-CON) 20 MEQ tablet TAKE 1 TABLET DAILY 90 tablet 2  . rosuvastatin (CRESTOR) 10 MG tablet TAKE 1 TABLET DAILY 90 tablet 0   No current facility-administered medications for this visit.     Allergies  Allergen Reactions  . Aspirin Hives  . Darvon [Propoxyphene] Hives  . Latex Hives, Itching and Swelling  . Naproxen Hives and Itching    Family History  Problem Relation Age of Onset  .  Heart disease Mother   . Arthritis Father   . Cancer Father     Prostate  . Diabetes Sister   . Diabetes Brother   . Breast cancer Neg Hx     Social History   Social History  . Marital status: Widowed    Spouse name: N/A  . Number of children: N/A  . Years of education: N/A   Occupational History  . Not on file.   Social History Main Topics  . Smoking status: Current Every Day Smoker    Packs/day: 0.20    Years: 50.00    Types: Cigarettes  . Smokeless tobacco: Never Used  . Alcohol use No  . Drug use: No  . Sexual activity: Not Currently   Other Topics Concern  . Not on file   Social History Narrative  . No narrative on file     Constitutional: Denies fever, malaise, fatigue, headache or  abrupt weight changes.  Respiratory: Denies difficulty breathing, shortness of breath, cough or sputum production.   Cardiovascular: Denies chest pain, chest tightness, palpitations or swelling in the hands or feet.  Neurological: Denies dizziness, difficulty with memory, difficulty with speech or problems with balance and coordination.    No other specific complaints in a complete review of systems (except as listed in HPI above).  Objective:   Physical Exam  Wt 236 lb (107 kg)   BMI 38.09 kg/m  Wt Readings from Last 3 Encounters:  02/19/16 236 lb (107 kg)  01/18/16 236 lb 8 oz (107.3 kg)  12/13/15 241 lb (109.3 kg)    General: Appears her stated age, well developed, well nourished in NAD. Cardiovascular: Normal rate and rhythm. S1,S2 noted.  No murmur, rubs or gallops noted.  Pulmonary/Chest: Normal effort and positive vesicular breath sounds. No respiratory distress. No wheezes, rales or ronchi noted.  Neurological: Alert and oriented.   BMET    Component Value Date/Time   NA 141 12/13/2015 1503   K 3.5 12/13/2015 1503   CL 104 12/13/2015 1503   CO2 33 (H) 12/13/2015 1503   GLUCOSE 94 12/13/2015 1503   BUN 10 12/13/2015 1503   CREATININE 0.75 12/13/2015 1503   CALCIUM 9.6 12/13/2015 1503   GFRNONAA >60 09/21/2015 1415   GFRAA >60 09/21/2015 1415    Lipid Panel     Component Value Date/Time   CHOL 132 12/13/2015 1503   TRIG 153.0 (H) 12/13/2015 1503   HDL 33.00 (L) 12/13/2015 1503   CHOLHDL 4 12/13/2015 1503   VLDL 30.6 12/13/2015 1503   LDLCALC 68 12/13/2015 1503    CBC    Component Value Date/Time   WBC 12.1 (H) 12/13/2015 1503   RBC 4.67 12/13/2015 1503   HGB 13.8 12/13/2015 1503   HCT 41.1 12/13/2015 1503   PLT 359.0 12/13/2015 1503   MCV 88.0 12/13/2015 1503   MCH 29.5 09/21/2015 1415   MCHC 33.6 12/13/2015 1503   RDW 14.5 12/13/2015 1503   LYMPHSABS 3.4 02/07/2015 1131   MONOABS 0.8 02/07/2015 1131   EOSABS 0.4 02/07/2015 1131   BASOSABS 0.0  02/07/2015 1131    Hgb A1C Lab Results  Component Value Date   HGBA1C 6.7 (H) 12/13/2015          Assessment & Plan:   Abnormal Weight Gain:  Increase Phentermine to 37.5 mg tabs Advised her to increase fresh fruits and veggies, less carbs Start exercising  RTC in 2 month for weight check/med refill Webb Silversmith, NP

## 2016-03-11 ENCOUNTER — Other Ambulatory Visit: Payer: Self-pay | Admitting: Internal Medicine

## 2016-03-28 ENCOUNTER — Ambulatory Visit (INDEPENDENT_AMBULATORY_CARE_PROVIDER_SITE_OTHER): Payer: Medicare Other | Admitting: Internal Medicine

## 2016-03-28 ENCOUNTER — Encounter: Payer: Self-pay | Admitting: Internal Medicine

## 2016-03-28 VITALS — BP 126/76 | HR 75 | Temp 97.9°F | Wt 238.0 lb

## 2016-03-28 DIAGNOSIS — R635 Abnormal weight gain: Secondary | ICD-10-CM | POA: Diagnosis not present

## 2016-03-28 NOTE — Progress Notes (Signed)
Subjective:    Patient ID: Kim Harding, female    DOB: 04/06/1946, 70 y.o.   MRN: [id number]  HPI   Pt presents to the clinic today for 1 month follow up for weight check/med refill. She was started on Phentermine 11/2015. Her PCP in [place] had her on Contrave prior to that. Her starting weight was 241 lbs with a BMI of 38.92. Her weight today is 238 lbs, up 2 lbs over the last month. She reports she went out of town for a month, and forgot to take her medication with her. She restarted the Phentermine on Tuesday. She has continued to try cutting back on carbs, using wheat products instead of white or potato breads. She is not drinking much water throughout the day, usually drinking 4 cups a day.  She reports she is no longer waking up in the middle of the night to snack and eat.  Breakfast: 2 eggs with cheese and sausage Lunch: salad or sandwich Dinner: weight watchers meals prior to 6pm Snack: yogurt, applesauce, or smoothie Drinks: usually drinks Diet Pepsi throughout the day, about 4 cups of water daily, will add lemon juice to the water  Exercise: None   Review of Systems      Past Medical History:  Diagnosis Date  . Allergy   . Arthritis   . Chicken pox   . Hyperlipidemia   . Hypertension     Current Outpatient Prescriptions  Medication Sig Dispense Refill  . albuterol (PROVENTIL HFA;VENTOLIN HFA) 108 (90 BASE) MCG/ACT inhaler Inhale 2 puffs into the lungs every 6 (six) hours as needed for wheezing or shortness of breath. 6.7 Inhaler 3  . Blood Glucose Monitoring Suppl (ONE TOUCH ULTRA 2) w/Device KIT 1 Device by Does not apply route once. 1 each 0  . Cholecalciferol (VITAMIN D-3) 1000 UNITS CAPS Take 1 capsule by mouth daily.    Marland Kitchen esomeprazole (NEXIUM) 40 MG capsule TAKE 1 CAPSULE DAILY AT 12 NOON 90 capsule 2  . glucose blood (ONE TOUCH TEST STRIPS) test strip 1 each by Other route 3 (three) times daily. Use as instructed 300 each 3  . hydrochlorothiazide  (HYDRODIURIL) 12.5 MG tablet TAKE 1 TABLET DAILY 90 tablet 2  . ibuprofen (ADVIL,MOTRIN) 800 MG tablet Take 1 tablet (800 mg total) by mouth every 8 (eight) hours as needed. 60 tablet 2  . meclizine (ANTIVERT) 25 MG tablet Take 1 tablet (25 mg total) by mouth 3 (three) times daily as needed for dizziness. 30 tablet 0  . metFORMIN (GLUCOPHAGE) 500 MG tablet Take 1 tablet (500 mg total) by mouth 2 (two) times daily with a meal. 180 tablet 1  . metoprolol succinate (TOPROL-XL) 100 MG 24 hr tablet TAKE 1 TABLET DAILY 90 tablet 2  . ONE TOUCH LANCETS MISC 1 each by Does not apply route 3 (three) times daily. 400 each 3  . phentermine 37.5 MG capsule Take 1 capsule (37.5 mg total) by mouth every morning. 30 capsule 1  . potassium chloride SA (K-DUR,KLOR-CON) 20 MEQ tablet TAKE 1 TABLET DAILY 90 tablet 2  . rosuvastatin (CRESTOR) 10 MG tablet TAKE 1 TABLET DAILY 90 tablet 0   No current facility-administered medications for this visit.     Allergies  Allergen Reactions  . Aspirin Hives  . Darvon [Propoxyphene] Hives  . Latex Hives, Itching and Swelling  . Naproxen Hives and Itching    Family History  Problem Relation Age of Onset  . Heart disease Mother   .  Arthritis Father   . Cancer Father     Prostate  . Diabetes Sister   . Diabetes Brother   . Breast cancer Neg Hx     Social History   Social History  . Marital status: Widowed    Spouse name: N/A  . Number of children: N/A  . Years of education: N/A   Occupational History  . Not on file.   Social History Main Topics  . Smoking status: Current Every Day Smoker    Packs/day: 0.20    Years: 50.00    Types: Cigarettes  . Smokeless tobacco: Never Used  . Alcohol use No  . Drug use: No  . Sexual activity: Not Currently   Other Topics Concern  . Not on file   Social History Narrative  . No narrative on file     Constitutional: Denies fever, malaise, fatigue, headache or abrupt weight changes.  Respiratory: Denies  difficulty breathing, shortness of breath, cough or sputum production.   Cardiovascular: Denies chest pain, chest tightness, palpitations or swelling in the hands or feet.  Neurological: Denies dizziness, difficulty with memory, difficulty with speech or problems with balance and coordination.    No other specific complaints in a complete review of systems (except as listed in HPI above).  Objective:   Physical Exam BP 126/76   Pulse 75   Temp 97.9 F (36.6 C) (Oral)   Wt 238 lb (108 kg)   SpO2 97%   BMI 38.41 kg/m  Wt Readings from Last 3 Encounters:  03/28/16 238 lb (108 kg)  02/19/16 236 lb (107 kg)  01/18/16 236 lb 8 oz (107.3 kg)    General: Appears her stated age, well developed, well nourished in NAD. Cardiovascular: Normal rate and rhythm. S1,S2 noted.  No murmur, rubs or gallops noted.  Pulmonary/Chest: Normal effort and positive vesicular breath sounds. No respiratory distress. No wheezes, rales or ronchi noted.  Neurological: Alert and oriented.   BMET    Component Value Date/Time   NA 141 12/13/2015 1503   K 3.5 12/13/2015 1503   CL 104 12/13/2015 1503   CO2 33 (H) 12/13/2015 1503   GLUCOSE 94 12/13/2015 1503   BUN 10 12/13/2015 1503   CREATININE 0.75 12/13/2015 1503   CALCIUM 9.6 12/13/2015 1503   GFRNONAA >60 09/21/2015 1415   GFRAA >60 09/21/2015 1415    Lipid Panel     Component Value Date/Time   CHOL 132 12/13/2015 1503   TRIG 153.0 (H) 12/13/2015 1503   HDL 33.00 (L) 12/13/2015 1503   CHOLHDL 4 12/13/2015 1503   VLDL 30.6 12/13/2015 1503   LDLCALC 68 12/13/2015 1503    CBC    Component Value Date/Time   WBC 12.1 (H) 12/13/2015 1503   RBC 4.67 12/13/2015 1503   HGB 13.8 12/13/2015 1503   HCT 41.1 12/13/2015 1503   PLT 359.0 12/13/2015 1503   MCV 88.0 12/13/2015 1503   MCH 29.5 09/21/2015 1415   MCHC 33.6 12/13/2015 1503   RDW 14.5 12/13/2015 1503   LYMPHSABS 3.4 02/07/2015 1131   MONOABS 0.8 02/07/2015 1131   EOSABS 0.4 02/07/2015  1131   BASOSABS 0.0 02/07/2015 1131    Hgb A1C Lab Results  Component Value Date   HGBA1C 6.7 (H) 12/13/2015          Assessment & Plan:   Abnormal Weight Gain:  Advised her to restart the Phentermine Advised her to increase fresh fruits and veggies, less carbs Start exercising  RTC in  2 month for weight check/med refill Webb Silversmith, NP

## 2016-03-28 NOTE — Patient Instructions (Signed)

## 2016-04-01 ENCOUNTER — Ambulatory Visit
Admission: RE | Admit: 2016-04-01 | Discharge: 2016-04-01 | Disposition: A | Discharge status: Home / Self Care | Payer: Medicare Other | Source: Ambulatory Visit | Attending: Internal Medicine | Admitting: Internal Medicine

## 2016-04-01 ENCOUNTER — Other Ambulatory Visit: Payer: Self-pay | Admitting: Internal Medicine

## 2016-04-01 DIAGNOSIS — Z1231 Encounter for screening mammogram for malignant neoplasm of breast: Secondary | ICD-10-CM

## 2016-04-02 IMAGING — CR DG CHEST 2V
2 series · 2 of 2 positions shown · non-contrast
Comparison: None.

CLINICAL DATA: Dyspnea on exertion, history of smoking

EXAM:
CHEST  2 VIEW

[view not recorded (1 of 2)]
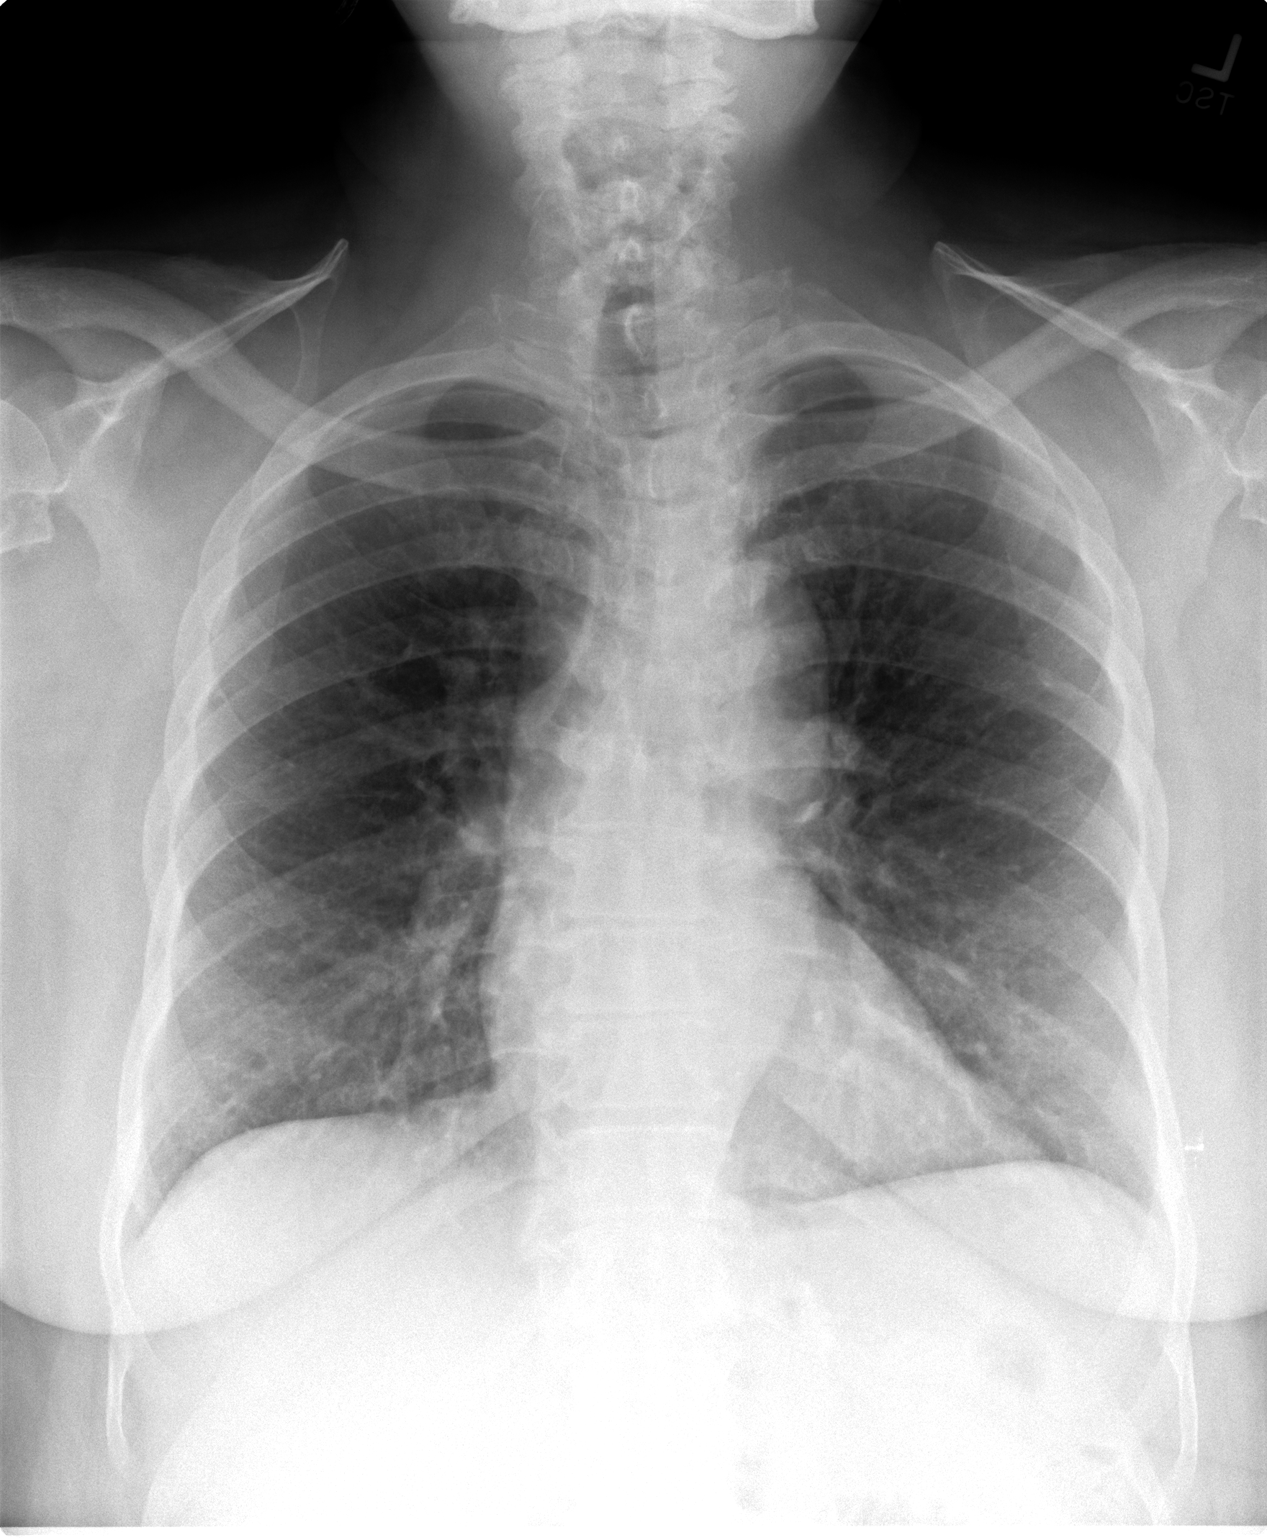

[view not recorded (2 of 2)]
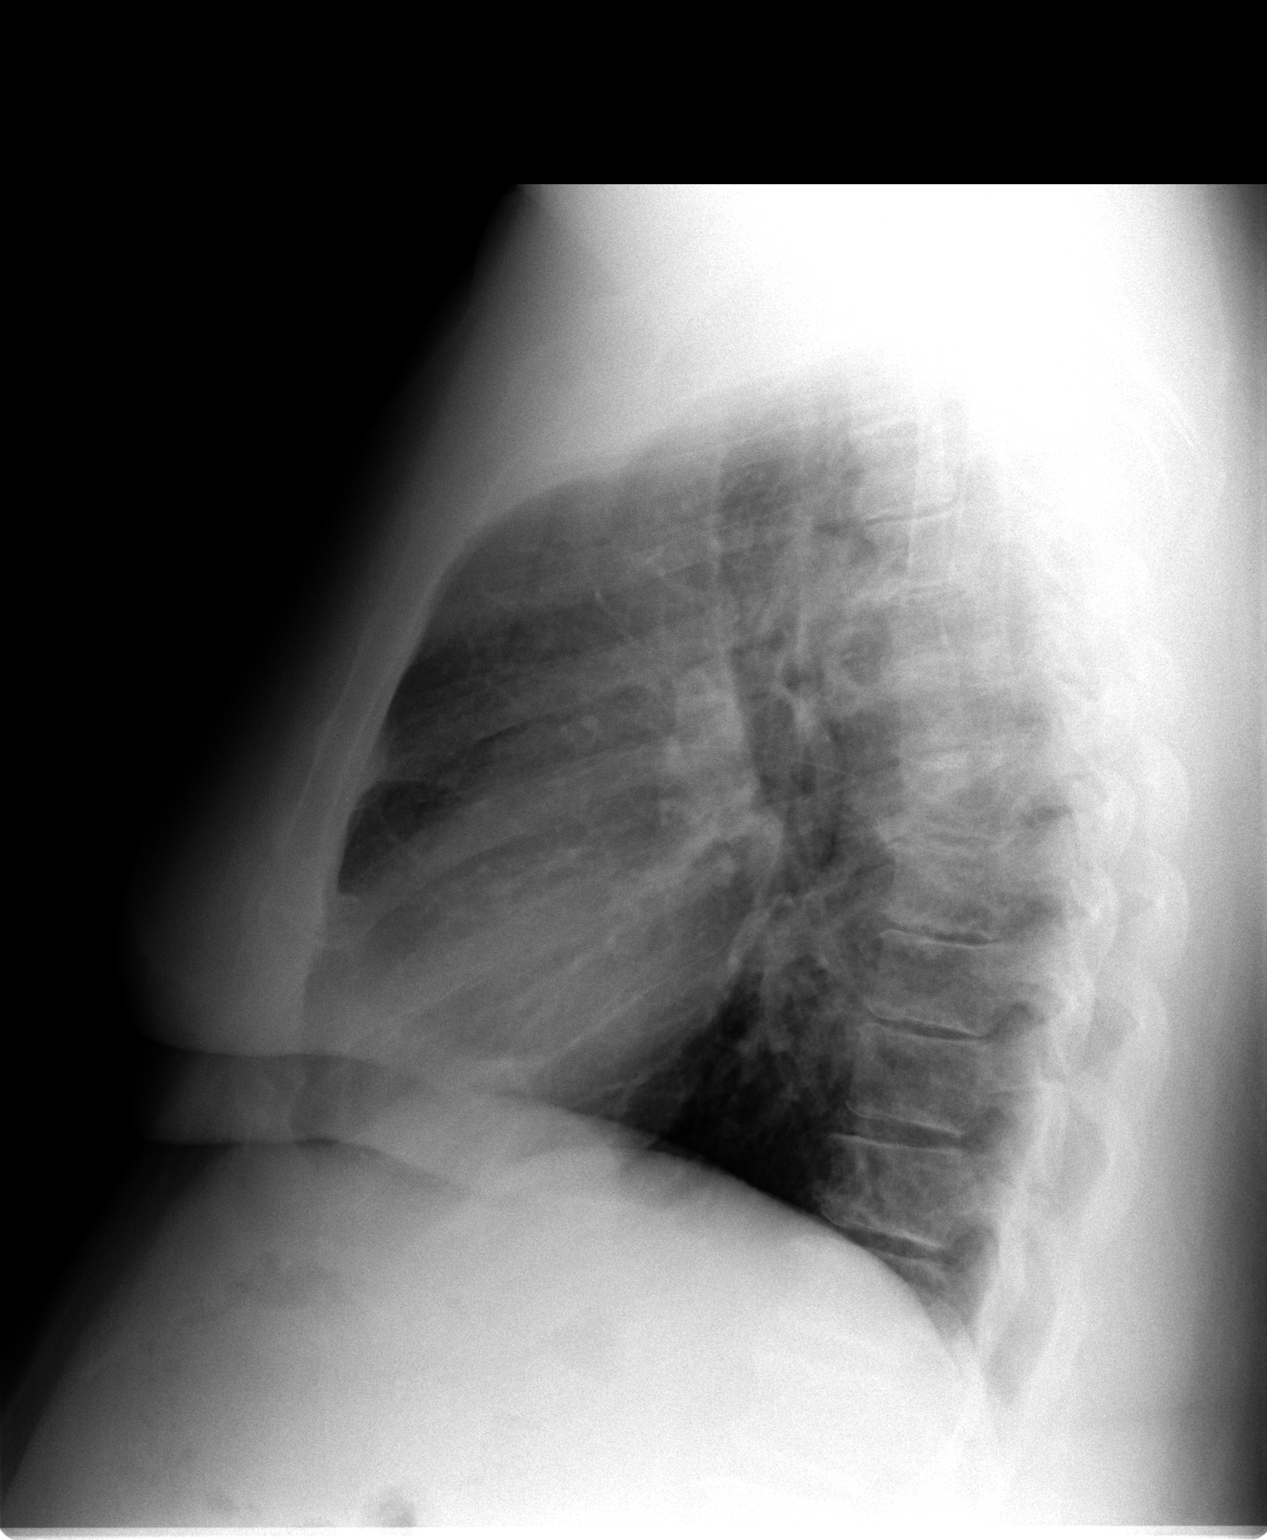

[2 of 2 positions shown; findings below may reference images not displayed]

FINDINGS: Cardiac shadow is within normal limits. The lungs are well aerated
bilaterally. Mild interstitial changes are seen without focal
infiltrate. No effusion or pneumothorax is noted.
IMPRESSION: No active cardiopulmonary disease.

## 2016-04-25 ENCOUNTER — Ambulatory Visit (INDEPENDENT_AMBULATORY_CARE_PROVIDER_SITE_OTHER): Payer: Medicare Other | Admitting: Internal Medicine

## 2016-04-25 ENCOUNTER — Encounter: Payer: Self-pay | Admitting: Internal Medicine

## 2016-04-25 VITALS — BP 120/70 | HR 81 | Temp 98.5°F | Wt 233.0 lb

## 2016-04-25 DIAGNOSIS — R635 Abnormal weight gain: Secondary | ICD-10-CM | POA: Diagnosis not present

## 2016-04-25 MED ORDER — PHENTERMINE HCL 37.5 MG PO CAPS: 37.5000 mg | ORAL_CAPSULE | ORAL | 2 refills | Status: DC

## 2016-04-25 NOTE — Progress Notes (Signed)
Subjective:    Patient ID: Kim Harding, female    DOB: Jun 29, 1946, 70 y.o.   MRN: [id number]  HPI   Pt presents to the clinic today for 1 month follow up for weight check/med refill. She was started on Phentermine 11/2015. Her PCP in [place] had her on Contrave prior to that. Her starting weight was 241 lbs with a BMI of 38.92. Her weight today is 233 lbs, down 5 lbs over the last month, BMI of 37.61. She has not made any additional changes in her diet or activity level over the last month.  She reports she is no longer waking up in the middle of the night to snack and eat.  Breakfast: 2 eggs with cheese and sausage Lunch: salad or sandwich Dinner: weight watchers meals prior to 6pm Snack: yogurt, applesauce, or smoothie Drinks: usually drinks Diet Pepsi throughout the day, about 4 cups of water daily, will add lemon juice to the water  Exercise: None   Review of Systems      Past Medical History:  Diagnosis Date  . Allergy   . Arthritis   . Chicken pox   . Hyperlipidemia   . Hypertension     Current Outpatient Prescriptions  Medication Sig Dispense Refill  . albuterol (PROVENTIL HFA;VENTOLIN HFA) 108 (90 BASE) MCG/ACT inhaler Inhale 2 puffs into the lungs every 6 (six) hours as needed for wheezing or shortness of breath. 6.7 Inhaler 3  . Blood Glucose Monitoring Suppl (ONE TOUCH ULTRA 2) w/Device KIT 1 Device by Does not apply route once. 1 each 0  . Cholecalciferol (VITAMIN D-3) 1000 UNITS CAPS Take 1 capsule by mouth daily.    Marland Kitchen esomeprazole (NEXIUM) 40 MG capsule TAKE 1 CAPSULE DAILY AT 12 NOON 90 capsule 2  . glucose blood (ONE TOUCH TEST STRIPS) test strip 1 each by Other route 3 (three) times daily. Use as instructed 300 each 3  . hydrochlorothiazide (HYDRODIURIL) 12.5 MG tablet TAKE 1 TABLET DAILY 90 tablet 2  . ibuprofen (ADVIL,MOTRIN) 800 MG tablet Take 1 tablet (800 mg total) by mouth every 8 (eight) hours as needed. 60 tablet 2  . meclizine (ANTIVERT) 25 MG  tablet Take 1 tablet (25 mg total) by mouth 3 (three) times daily as needed for dizziness. 30 tablet 0  . metFORMIN (GLUCOPHAGE) 500 MG tablet Take 1 tablet (500 mg total) by mouth 2 (two) times daily with a meal. 180 tablet 1  . metoprolol succinate (TOPROL-XL) 100 MG 24 hr tablet TAKE 1 TABLET DAILY 90 tablet 2  . ONE TOUCH LANCETS MISC 1 each by Does not apply route 3 (three) times daily. 400 each 3  . phentermine 37.5 MG capsule Take 1 capsule (37.5 mg total) by mouth every morning. 30 capsule 1  . potassium chloride SA (K-DUR,KLOR-CON) 20 MEQ tablet TAKE 1 TABLET DAILY 90 tablet 2  . rosuvastatin (CRESTOR) 10 MG tablet TAKE 1 TABLET DAILY 90 tablet 0   No current facility-administered medications for this visit.     Allergies  Allergen Reactions  . Aspirin Hives  . Darvon [Propoxyphene] Hives  . Latex Hives, Itching and Swelling  . Naproxen Hives and Itching    Family History  Problem Relation Age of Onset  . Heart disease Mother   . Arthritis Father   . Cancer Father     Prostate  . Diabetes Sister   . Diabetes Brother   . Breast cancer Neg Hx     Social History  Social History  . Marital status: Widowed    Spouse name: N/A  . Number of children: N/A  . Years of education: N/A   Occupational History  . Not on file.   Social History Main Topics  . Smoking status: Current Every Day Smoker    Packs/day: 0.20    Years: 50.00    Types: Cigarettes  . Smokeless tobacco: Never Used  . Alcohol use No  . Drug use: No  . Sexual activity: Not Currently   Other Topics Concern  . Not on file   Social History Narrative  . No narrative on file     Constitutional: Denies fever, malaise, fatigue, headache or abrupt weight changes.  Respiratory: Denies difficulty breathing, shortness of breath, cough or sputum production.   Cardiovascular: Denies chest pain, chest tightness, palpitations or swelling in the hands or feet.  Neurological: Denies dizziness, difficulty  with memory, difficulty with speech or problems with balance and coordination.    No other specific complaints in a complete review of systems (except as listed in HPI above).  Objective:   Physical Exam BP 120/70   Pulse 81   Temp 98.5 F (36.9 C) (Oral)   Wt 233 lb (105.7 kg)   SpO2 98%   BMI 37.61 kg/m  Wt Readings from Last 3 Encounters:  04/25/16 233 lb (105.7 kg)  03/28/16 238 lb (108 kg)  02/19/16 236 lb (107 kg)    General: Appears her stated age, obese in NAD. Cardiovascular: Normal rate and rhythm. S1,S2 noted.  No murmur, rubs or gallops noted.  Pulmonary/Chest: Normal effort and positive vesicular breath sounds. No respiratory distress. No wheezes, rales or ronchi noted.  Neurological: Alert and oriented.   BMET    Component Value Date/Time   NA 141 12/13/2015 1503   K 3.5 12/13/2015 1503   CL 104 12/13/2015 1503   CO2 33 (H) 12/13/2015 1503   GLUCOSE 94 12/13/2015 1503   BUN 10 12/13/2015 1503   CREATININE 0.75 12/13/2015 1503   CALCIUM 9.6 12/13/2015 1503   GFRNONAA >60 09/21/2015 1415   GFRAA >60 09/21/2015 1415    Lipid Panel     Component Value Date/Time   CHOL 132 12/13/2015 1503   TRIG 153.0 (H) 12/13/2015 1503   HDL 33.00 (L) 12/13/2015 1503   CHOLHDL 4 12/13/2015 1503   VLDL 30.6 12/13/2015 1503   LDLCALC 68 12/13/2015 1503    CBC    Component Value Date/Time   WBC 12.1 (H) 12/13/2015 1503   RBC 4.67 12/13/2015 1503   HGB 13.8 12/13/2015 1503   HCT 41.1 12/13/2015 1503   PLT 359.0 12/13/2015 1503   MCV 88.0 12/13/2015 1503   MCH 29.5 09/21/2015 1415   MCHC 33.6 12/13/2015 1503   RDW 14.5 12/13/2015 1503   LYMPHSABS 3.4 02/07/2015 1131   MONOABS 0.8 02/07/2015 1131   EOSABS 0.4 02/07/2015 1131   BASOSABS 0.0 02/07/2015 1131    Hgb A1C Lab Results  Component Value Date   HGBA1C 6.7 (H) 12/13/2015          Assessment & Plan:   Abnormal Weight Gain:  Encouraged her to continue to work on the diet Advised her to  increase fresh fruits and veggies, less carbs Discussed the importance of adding exercise into her regimen  RTC in 3 month for weight check/med refill Webb Silversmith, NP

## 2016-04-25 NOTE — Patient Instructions (Signed)

## 2016-05-15 ENCOUNTER — Other Ambulatory Visit: Payer: Self-pay | Admitting: Internal Medicine

## 2016-05-24 ENCOUNTER — Other Ambulatory Visit: Payer: Self-pay | Admitting: Internal Medicine

## 2016-07-26 ENCOUNTER — Ambulatory Visit: Payer: Medicare Other | Admitting: Internal Medicine

## 2016-07-30 ENCOUNTER — Ambulatory Visit (INDEPENDENT_AMBULATORY_CARE_PROVIDER_SITE_OTHER): Payer: Medicare Other | Admitting: Internal Medicine

## 2016-07-30 ENCOUNTER — Encounter: Payer: Self-pay | Admitting: Internal Medicine

## 2016-07-30 VITALS — BP 120/68 | HR 67 | Temp 98.8°F | Wt 239.0 lb

## 2016-07-30 DIAGNOSIS — R635 Abnormal weight gain: Secondary | ICD-10-CM

## 2016-07-30 DIAGNOSIS — E119 Type 2 diabetes mellitus without complications: Secondary | ICD-10-CM

## 2016-07-30 MED ORDER — NALTREXONE-BUPROPION HCL ER 8-90 MG PO TB12: ORAL_TABLET | ORAL | 2 refills | Status: DC

## 2016-07-30 NOTE — Progress Notes (Signed)
Subjective:    Patient ID: Kim Harding, female    DOB: 1945/10/31, 71 y.o.   MRN: [id number]  HPI  Pt presents to the clinic today for 3 month follow up for weight check and med refill. She was started on Phentermine 12/2015. Her starting weight at that time was 236.5 lbs with a BMI of 38.19. Her weight today is 239 lbs, with a BMI of 38.58. 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 She initially lost some weight but then gained it all back. Her main issue she feels like is snacking late at night. She does not know how to control it.  Breakfast: Cold cereal with skim mild or scrambled egg with sausage Lunch: She does not really eat lunch Dinner: Whole wheat sphagetti Snacks: Slim fast snacks Exercise: None  She is also due for follow up of DM 2. Her last A1C was 6.7%, 11/2015. Her sugars range 107-138. She is taking Metformin 500 mg BID as prescribed and denies GI side effects. Microalbumin was done 11/2015. Flu- never. Pneumovax 03/2014. Prevnar 03/2015. Her last eye exam was 04/2016.  Review of Systems  Past Medical History:  Diagnosis Date  . Allergy   . Arthritis   . Chicken pox   . Hyperlipidemia   . Hypertension     Current Outpatient Prescriptions  Medication Sig Dispense Refill  . albuterol (PROVENTIL HFA;VENTOLIN HFA) 108 (90 BASE) MCG/ACT inhaler Inhale 2 puffs into the lungs every 6 (six) hours as needed for wheezing or shortness of breath. 6.7 Inhaler 3  . Blood Glucose Monitoring Suppl (ONE TOUCH ULTRA 2) w/Device KIT 1 Device by Does not apply route once. 1 each 0  . Cholecalciferol (VITAMIN D-3) 1000 UNITS CAPS Take 1 capsule by mouth daily.    Marland Kitchen esomeprazole (NEXIUM) 40 MG capsule TAKE 1 CAPSULE DAILY AT 12 NOON 90 capsule 2  . glucose blood (ONE TOUCH TEST STRIPS) test strip 1 each by Other route 3 (three) times daily. Use as instructed 300 each 3  . hydrochlorothiazide (HYDRODIURIL) 12.5 MG tablet TAKE 1 TABLET DAILY 90 tablet 2  . ibuprofen  (ADVIL,MOTRIN) 800 MG tablet Take 1 tablet (800 mg total) by mouth every 8 (eight) hours as needed. 60 tablet 2  . meclizine (ANTIVERT) 25 MG tablet Take 1 tablet (25 mg total) by mouth 3 (three) times daily as needed for dizziness. 30 tablet 0  . metFORMIN (GLUCOPHAGE) 500 MG tablet TAKE 1 TABLET TWICE A DAY WITH MEALS 180 tablet 1  . metoprolol succinate (TOPROL-XL) 100 MG 24 hr tablet TAKE 1 TABLET DAILY 90 tablet 2  . ONE TOUCH LANCETS MISC 1 each by Does not apply route 3 (three) times daily. 400 each 3  . phentermine 37.5 MG capsule Take 1 capsule (37.5 mg total) by mouth every morning. 30 capsule 2  . potassium chloride SA (K-DUR,KLOR-CON) 20 MEQ tablet TAKE 1 TABLET DAILY 90 tablet 2  . rosuvastatin (CRESTOR) 10 MG tablet TAKE 1 TABLET DAILY 90 tablet 1   No current facility-administered medications for this visit.     Allergies  Allergen Reactions  . Aspirin Hives  . Darvon [Propoxyphene] Hives  . Latex Hives, Itching and Swelling  . Naproxen Hives and Itching    Family History  Problem Relation Age of Onset  . Heart disease Mother   . Arthritis Father   . Cancer Father     Prostate  . Diabetes Sister   . Diabetes Brother   .  Breast cancer Neg Hx     Social History   Social History  . Marital status: Widowed    Spouse name: N/A  . Number of children: N/A  . Years of education: N/A   Occupational History  . Not on file.   Social History Main Topics  . Smoking status: Current Every Day Smoker    Packs/day: 0.20    Years: 50.00    Types: Cigarettes  . Smokeless tobacco: Never Used  . Alcohol use No  . Drug use: No  . Sexual activity: Not Currently   Other Topics Concern  . Not on file   Social History Narrative  . No narrative on file     Constitutional: Pt reports weight gain. Denies fever, malaise, fatigue, headache.  Respiratory: Denies difficulty breathing, shortness of breath, cough or sputum production.   Cardiovascular: Denies chest pain,  chest tightness, palpitations or swelling in the hands or feet.  Neurological: Denies dizziness, difficulty with memory, difficulty with speech or problems with balance and coordination.    No other specific complaints in a complete review of systems (except as listed in HPI above).     Objective:   Physical Exam   BP 120/68   Pulse 67   Temp 98.8 F (37.1 C) (Oral)   Wt 239 lb (108.4 kg)   SpO2 97%   BMI 38.58 kg/m  Wt Readings from Last 3 Encounters:  07/30/16 239 lb (108.4 kg)  04/25/16 233 lb (105.7 kg)  03/28/16 238 lb (108 kg)    General: Appears her stated age, obese in NAD. Cardiovascular: Normal rate and rhythm. S1,S2 noted.  Pulmonary/Chest: Normal effort and positive vesicular breath sounds. No respiratory distress. No wheezes, rales or ronchi noted.   Neurological: Alert and oriented.    BMET    Component Value Date/Time   NA 141 12/13/2015 1503   K 3.5 12/13/2015 1503   CL 104 12/13/2015 1503   CO2 33 (H) 12/13/2015 1503   GLUCOSE 94 12/13/2015 1503   BUN 10 12/13/2015 1503   CREATININE 0.75 12/13/2015 1503   CALCIUM 9.6 12/13/2015 1503   GFRNONAA >60 09/21/2015 1415   GFRAA >60 09/21/2015 1415    Lipid Panel     Component Value Date/Time   CHOL 132 12/13/2015 1503   TRIG 153.0 (H) 12/13/2015 1503   HDL 33.00 (L) 12/13/2015 1503   CHOLHDL 4 12/13/2015 1503   VLDL 30.6 12/13/2015 1503   LDLCALC 68 12/13/2015 1503    CBC    Component Value Date/Time   WBC 12.1 (H) 12/13/2015 1503   RBC 4.67 12/13/2015 1503   HGB 13.8 12/13/2015 1503   HCT 41.1 12/13/2015 1503   PLT 359.0 12/13/2015 1503   MCV 88.0 12/13/2015 1503   MCH 29.5 09/21/2015 1415   MCHC 33.6 12/13/2015 1503   RDW 14.5 12/13/2015 1503   LYMPHSABS 3.4 02/07/2015 1131   MONOABS 0.8 02/07/2015 1131   EOSABS 0.4 02/07/2015 1131   BASOSABS 0.0 02/07/2015 1131    Hgb A1C Lab Results  Component Value Date   HGBA1C 6.7 (H) 12/13/2015           Assessment & Plan:    Abnormal Weight Gain:  Phentermine is not working for her We will go back to Contrave Discussed the importance of a low carb, high protein diet and exercising for weight loss  RTC in 3 months for weight check/med refill , , NP  

## 2016-07-30 NOTE — Patient Instructions (Signed)
Carbohydrate Counting for Diabetes Mellitus, Adult Carbohydrate counting is a method for keeping track of how many carbohydrates you eat. Eating carbohydrates naturally increases the amount of sugar (glucose) in the blood. Counting how many carbohydrates you eat helps keep your blood glucose within normal limits, which helps you manage your diabetes (diabetes mellitus). It is important to know how many carbohydrates you can safely have in each meal. This is different for every person. A diet and nutrition specialist (registered dietitian) can help you make a meal plan and calculate how many carbohydrates you should have at each meal and snack. Carbohydrates are found in the following foods:  Grains, such as breads and cereals.  Dried beans and soy products.  Starchy vegetables, such as potatoes, peas, and corn.  Fruit and fruit juices.  Milk and yogurt.  Sweets and snack foods, such as cake, cookies, candy, chips, and soft drinks. How do I count carbohydrates? There are two ways to count carbohydrates in food. You can use either of the methods or a combination of both. Reading "Nutrition Facts" on packaged food  The "Nutrition Facts" list is included on the labels of almost all packaged foods and beverages in the [place] It includes:  The serving size.  Information about nutrients in each serving, including the grams (g) of carbohydrate per serving. To use the "Nutrition Facts":  Decide how many servings you will have.  Multiply the number of servings by the number of carbohydrates per serving.  The resulting number is the total amount of carbohydrates that you will be having. Learning standard serving sizes of other foods  When you eat foods containing carbohydrates that are not packaged or do not include "Nutrition Facts" on the label, you need to measure the servings in order to count the amount of carbohydrates:  Measure the foods that you will eat with a food scale or measuring  cup, if needed.  Decide how many standard-size servings you will eat.  Multiply the number of servings by 15. Most carbohydrate-rich foods have about 15 g of carbohydrates per serving.  For example, if you eat 8 oz (170 g) of strawberries, you will have eaten 2 servings and 30 g of carbohydrates (2 servings x 15 g = 30 g).  For foods that have more than one food mixed, such as soups and casseroles, you must count the carbohydrates in each food that is included. The following list contains standard serving sizes of common carbohydrate-rich foods. Each of these servings has about 15 g of carbohydrates:   hamburger bun or  English muffin.   oz (15 mL) syrup.   oz (14 g) jelly.  1 slice of bread.  1 six-inch tortilla.  3 oz (85 g) cooked rice or pasta.  4 oz (113 g) cooked dried beans.  4 oz (113 g) starchy vegetable, such as peas, corn, or potatoes.  4 oz (113 g) hot cereal.  4 oz (113 g) mashed potatoes or  of a large baked potato.  4 oz (113 g) canned or frozen fruit.  4 oz (120 mL) fruit juice.  4-6 crackers.  6 chicken nuggets.  6 oz (170 g) unsweetened dry cereal.  6 oz (170 g) plain fat-free yogurt or yogurt sweetened with artificial sweeteners.  8 oz (240 mL) milk.  8 oz (170 g) fresh fruit or one small piece of fruit.  24 oz (680 g) popped popcorn. Example of carbohydrate counting Sample meal  3 oz (85 g) chicken breast.  6 oz (  170 g) brown rice.  4 oz (113 g) corn.  8 oz (240 mL) milk.  8 oz (170 g) strawberries with sugar-free whipped topping. Carbohydrate calculation 1. Identify the foods that contain carbohydrates:  Rice.  Corn.  Milk.  Strawberries. 2. Calculate how many servings you have of each food:  2 servings rice.  1 serving corn.  1 serving milk.  1 serving strawberries. 3. Multiply each number of servings by 15 g:  2 servings rice x 15 g = 30 g.  1 serving corn x 15 g = 15 g.  1 serving milk x 15 g = 15  g.  1 serving strawberries x 15 g = 15 g. 4. Add together all of the amounts to find the total grams of carbohydrates eaten:  30 g + 15 g + 15 g + 15 g = 75 g of carbohydrates total. This information is not intended to replace advice given to you by your health care provider. Make sure you discuss any questions you have with your health care provider. Document Released: 07/08/2005 Document Revised: 01/26/2016 Document Reviewed: 12/20/2015 Elsevier Interactive Patient Education  2017 Elsevier Inc.  

## 2016-07-30 NOTE — Assessment & Plan Note (Signed)
She declines A1C today Encouraged her to consume a low card diet and exercise to lose weight Continue Metformin Microalbumin UTD She declines flu shot Pneumovax and Prevnar UTD Eye exam UTD

## 2016-08-27 ENCOUNTER — Other Ambulatory Visit: Payer: Self-pay

## 2016-08-27 MED ORDER — HYDROCHLOROTHIAZIDE 12.5 MG PO TABS: 12.5000 mg | ORAL_TABLET | Freq: Every day | ORAL | 1 refills | Status: DC

## 2016-08-27 MED ORDER — METOPROLOL SUCCINATE ER 100 MG PO TB24
100.0000 mg | ORAL_TABLET | Freq: Every day | ORAL | 1 refills | Status: DC
Start: 2016-08-27 — End: 2016-09-02

## 2016-09-02 MED ORDER — METOPROLOL SUCCINATE ER 100 MG PO TB24: 100.0000 mg | ORAL_TABLET | Freq: Every day | ORAL | 1 refills | Status: DC

## 2016-09-02 MED ORDER — HYDROCHLOROTHIAZIDE 12.5 MG PO TABS: 12.5000 mg | ORAL_TABLET | Freq: Every day | ORAL | 1 refills | Status: DC

## 2016-09-02 NOTE — Addendum Note (Signed)
Addended by: Roena MaladyEVONTENNO, MELANIE Y on: 09/02/2016 11:08 AM   Modules accepted: Orders

## 2016-09-05 ENCOUNTER — Ambulatory Visit (INDEPENDENT_AMBULATORY_CARE_PROVIDER_SITE_OTHER): Payer: Medicare Other | Admitting: Internal Medicine

## 2016-09-05 ENCOUNTER — Encounter: Payer: Self-pay | Admitting: Internal Medicine

## 2016-09-05 VITALS — BP 122/72 | HR 83 | Temp 98.4°F | Wt 237.0 lb

## 2016-09-05 DIAGNOSIS — K5732 Diverticulitis of large intestine without perforation or abscess without bleeding: Secondary | ICD-10-CM

## 2016-09-05 MED ORDER — METRONIDAZOLE 500 MG PO TABS: 500.0000 mg | ORAL_TABLET | Freq: Three times a day (TID) | ORAL | 0 refills | Status: DC

## 2016-09-05 MED ORDER — CIPROFLOXACIN HCL 500 MG PO TABS: 500.0000 mg | ORAL_TABLET | Freq: Two times a day (BID) | ORAL | 0 refills | Status: DC

## 2016-09-05 NOTE — Patient Instructions (Signed)
Diverticulitis °Diverticulitis is when small pockets that have formed in your colon (large intestine) become infected or swollen. °Follow these instructions at home: °· Follow your doctor's instructions. °· Follow a special diet if told by your doctor. °· When you feel better, your doctor may tell you to change your diet. You may be told to eat a lot of fiber. Fruits and vegetables are good sources of fiber. Fiber makes it easier to poop (have bowel movements). °· Take supplements or probiotics as told by your doctor. °· Only take medicines as told by your doctor. °· Keep all follow-up visits with your doctor. °Contact a doctor if: °· Your pain does not get better. °· You have a hard time eating food. °· You are not pooping like normal. °Get help right away if: °· Your pain gets worse. °· Your problems do not get better. °· Your problems suddenly get worse. °· You have a fever. °· You keep throwing up (vomiting). °· You have bloody or black, tarry poop (stool). °This information is not intended to replace advice given to you by your health care provider. Make sure you discuss any questions you have with your health care provider. °Document Released: 12/25/2007 Document Revised: 12/14/2015 Document Reviewed: 06/02/2013 °Elsevier Interactive Patient Education © 2017 Elsevier Inc. ° °

## 2016-09-05 NOTE — Progress Notes (Signed)
Subjective:    Patient ID: Kim Harding, female    DOB: [birth year], 71 y.o.   MRN: [id number]  HPI  Pt presents to the clinic today with c/o abdominal cramping. This started 3-4 days ago. She has associated nausea, loss of appetite, and constipation. She is passing gas. She has no desire to eat or drink anything. She has not vomited. She has not tried anything OTC. She has had a colonoscopy years ago, they did say that she had diverticulosis. She denies fever or body aches, but has had chills.   Review of Systems  Past Medical History:  Diagnosis Date  . Allergy   . Arthritis   . Chicken pox   . Hyperlipidemia   . Hypertension     Current Outpatient Prescriptions  Medication Sig Dispense Refill  . albuterol (PROVENTIL HFA;VENTOLIN HFA) 108 (90 BASE) MCG/ACT inhaler Inhale 2 puffs into the lungs every 6 (six) hours as needed for wheezing or shortness of breath. 6.7 Inhaler 3  . Blood Glucose Monitoring Suppl (ONE TOUCH ULTRA 2) w/Device KIT 1 Device by Does not apply route once. 1 each 0  . Cholecalciferol (VITAMIN D-3) 1000 UNITS CAPS Take 1 capsule by mouth daily.    Marland Kitchen esomeprazole (NEXIUM) 40 MG capsule TAKE 1 CAPSULE DAILY AT 12 NOON 90 capsule 2  . glucose blood (ONE TOUCH TEST STRIPS) test strip 1 each by Other route 3 (three) times daily. Use as instructed 300 each 3  . hydrochlorothiazide (HYDRODIURIL) 12.5 MG tablet Take 1 tablet (12.5 mg total) by mouth daily. 90 tablet 1  . ibuprofen (ADVIL,MOTRIN) 800 MG tablet Take 1 tablet (800 mg total) by mouth every 8 (eight) hours as needed. 60 tablet 2  . meclizine (ANTIVERT) 25 MG tablet Take 1 tablet (25 mg total) by mouth 3 (three) times daily as needed for dizziness. 30 tablet 0  . metFORMIN (GLUCOPHAGE) 500 MG tablet TAKE 1 TABLET TWICE A DAY WITH MEALS 180 tablet 1  . metoprolol succinate (TOPROL-XL) 100 MG 24 hr tablet Take 1 tablet (100 mg total) by mouth daily. Take with or immediately following a meal. 90 tablet 1  .  Naltrexone-Bupropion HCl ER 8-90 MG TB12 Take 1 tab daily x 1 week, then increase to 1 tab BID x 1 week, then increase to 2 tabs in am, 1 in pm x 1 week, then take 2 tabs BID thereafter 120 tablet 2  . ONE TOUCH LANCETS MISC 1 each by Does not apply route 3 (three) times daily. 400 each 3  . potassium chloride SA (K-DUR,KLOR-CON) 20 MEQ tablet TAKE 1 TABLET DAILY 90 tablet 2  . rosuvastatin (CRESTOR) 10 MG tablet TAKE 1 TABLET DAILY 90 tablet 1   No current facility-administered medications for this visit.     Allergies  Allergen Reactions  . Aspirin Hives  . Darvon [Propoxyphene] Hives  . Latex Hives, Itching and Swelling  . Naproxen Hives and Itching    Family History  Problem Relation Age of Onset  . Heart disease Mother   . Arthritis Father   . Cancer Father     Prostate  . Diabetes Sister   . Diabetes Brother   . Breast cancer Neg Hx     Social History   Social History  . Marital status: Widowed    Spouse name: N/A  . Number of children: N/A  . Years of education: N/A   Occupational History  . Not on file.   Social History Main Topics  .  Smoking status: Current Every Day Smoker    Packs/day: 0.20    Years: 50.00    Types: Cigarettes  . Smokeless tobacco: Never Used  . Alcohol use No  . Drug use: No  . Sexual activity: Not Currently   Other Topics Concern  . Not on file   Social History Narrative  . No narrative on file     Constitutional: Denies fever, malaise, fatigue, headache or abrupt weight changes.  Gastrointestinal: Pt reports abdominal pain, constipation. Denies bloating, diarrhea or blood in the stool.  GU: Denies urgency, frequency, pain with urination, burning sensation, blood in urine, odor or discharge.  No other specific complaints in a complete review of systems (except as listed in HPI above).     Objective:   Physical Exam   BP 122/72   Pulse 83   Temp 98.4 F (36.9 C) (Oral)   Wt 237 lb (107.5 kg)   SpO2 98%   BMI 38.25  kg/m  Wt Readings from Last 3 Encounters:  09/05/16 237 lb (107.5 kg)  07/30/16 239 lb (108.4 kg)  04/25/16 233 lb (105.7 kg)    General: Appears her stated age, obese in NAD. Abdomen: Soft and generally tender but worse in the LLQ. Hypoactive bowel sounds. No distention or masses noted.    BMET    Component Value Date/Time   NA 141 12/13/2015 1503   K 3.5 12/13/2015 1503   CL 104 12/13/2015 1503   CO2 33 (H) 12/13/2015 1503   GLUCOSE 94 12/13/2015 1503   BUN 10 12/13/2015 1503   CREATININE 0.75 12/13/2015 1503   CALCIUM 9.6 12/13/2015 1503   GFRNONAA >60 09/21/2015 1415   GFRAA >60 09/21/2015 1415    Lipid Panel     Component Value Date/Time   CHOL 132 12/13/2015 1503   TRIG 153.0 (H) 12/13/2015 1503   HDL 33.00 (L) 12/13/2015 1503   CHOLHDL 4 12/13/2015 1503   VLDL 30.6 12/13/2015 1503   LDLCALC 68 12/13/2015 1503    CBC    Component Value Date/Time   WBC 12.1 (H) 12/13/2015 1503   RBC 4.67 12/13/2015 1503   HGB 13.8 12/13/2015 1503   HCT 41.1 12/13/2015 1503   PLT 359.0 12/13/2015 1503   MCV 88.0 12/13/2015 1503   MCH 29.5 09/21/2015 1415   MCHC 33.6 12/13/2015 1503   RDW 14.5 12/13/2015 1503   LYMPHSABS 3.4 02/07/2015 1131   MONOABS 0.8 02/07/2015 1131   EOSABS 0.4 02/07/2015 1131   BASOSABS 0.0 02/07/2015 1131    Hgb A1C Lab Results  Component Value Date   HGBA1C 6.7 (H) 12/13/2015           Assessment & Plan:   Diverticulitis:  Encouraged her to consume a clear liquid diet and advance as tolerated Advised her to drink fluids to avoid dehydration eRx for Cipro 500 mg BID x 10 days eRx for Flagyl 500 mg TID x 10 days Mirilax as needed for constipation  RTC as needed, to ER if worse Webb Silversmith, NP

## 2016-09-09 ENCOUNTER — Telehealth: Payer: Self-pay | Admitting: Internal Medicine

## 2016-09-09 MED ORDER — POTASSIUM CHLORIDE CRYS ER 20 MEQ PO TBCR: 20.0000 meq | EXTENDED_RELEASE_TABLET | Freq: Every day | ORAL | 2 refills | Status: DC

## 2016-09-09 NOTE — Telephone Encounter (Signed)
Pt is calling regarding her potassium chloride rx.  She is requesting that it be called into [facility] in [place] on [address]. She is out of this medication and needs it refilled asap.

## 2016-09-09 NOTE — Addendum Note (Signed)
Addended by: Lorre MunroeBAITY, Reily Ilic W on: 09/09/2016 12:27 PM   Modules accepted: Orders

## 2016-09-09 NOTE — Telephone Encounter (Signed)
Medication sent to [facility].

## 2016-09-23 ENCOUNTER — Other Ambulatory Visit: Payer: Self-pay | Admitting: Internal Medicine

## 2016-10-07 ENCOUNTER — Inpatient Hospital Stay
Admission: EM | Admit: 2016-10-07 | Discharge: 2016-10-09 | DRG: 392 | Disposition: A | Discharge status: Home / Self Care | Payer: Medicare Other | Attending: Internal Medicine | Admitting: Internal Medicine

## 2016-10-07 ENCOUNTER — Encounter: Payer: Self-pay | Admitting: *Deleted

## 2016-10-07 ENCOUNTER — Emergency Department: Payer: Medicare Other

## 2016-10-07 DIAGNOSIS — K5732 Diverticulitis of large intestine without perforation or abscess without bleeding: Secondary | ICD-10-CM | POA: Diagnosis present

## 2016-10-07 DIAGNOSIS — I10 Essential (primary) hypertension: Secondary | ICD-10-CM | POA: Diagnosis present

## 2016-10-07 DIAGNOSIS — Z7901 Long term (current) use of anticoagulants: Secondary | ICD-10-CM

## 2016-10-07 DIAGNOSIS — Z86718 Personal history of other venous thrombosis and embolism: Secondary | ICD-10-CM | POA: Diagnosis not present

## 2016-10-07 DIAGNOSIS — D72829 Elevated white blood cell count, unspecified: Secondary | ICD-10-CM | POA: Diagnosis present

## 2016-10-07 DIAGNOSIS — F17210 Nicotine dependence, cigarettes, uncomplicated: Secondary | ICD-10-CM | POA: Diagnosis present

## 2016-10-07 DIAGNOSIS — K5792 Diverticulitis of intestine, part unspecified, without perforation or abscess without bleeding: Secondary | ICD-10-CM | POA: Diagnosis present

## 2016-10-07 HISTORY — DX: Diverticulitis of intestine, part unspecified, without perforation or abscess without bleeding: K57.92

## 2016-10-07 HISTORY — DX: Type 2 diabetes mellitus without complications: E11.9

## 2016-10-07 LAB — COMPREHENSIVE METABOLIC PANEL
ALT: 21 U/L (ref 14–54)
AST: 24 U/L (ref 15–41)
Albumin: 3.9 g/dL (ref 3.5–5.0)
Alkaline Phosphatase: 68 U/L (ref 38–126)
Anion gap: 8 (ref 5–15)
BILIRUBIN TOTAL: 0.7 mg/dL (ref 0.3–1.2)
BUN: 7 mg/dL (ref 6–20)
CO2: 26 mmol/L (ref 22–32)
Calcium: 9.3 mg/dL (ref 8.9–10.3)
Chloride: 104 mmol/L (ref 101–111)
Creatinine, Ser: 0.74 mg/dL (ref 0.44–1.00)
GFR calc Af Amer: >60 mL/min (ref >60)
Glucose, Bld: 129 mg/dL — ABNORMAL HIGH (ref 65–99)
POTASSIUM: 3.8 mmol/L (ref 3.5–5.1)
Sodium: 138 mmol/L (ref 135–145)
TOTAL PROTEIN: 7.5 g/dL (ref 6.5–8.1)

## 2016-10-07 LAB — URINALYSIS, COMPLETE (UACMP) WITH MICROSCOPIC
BACTERIA UA: NONE SEEN
BILIRUBIN URINE: NEGATIVE
Glucose, UA: NEGATIVE mg/dL
HGB URINE DIPSTICK: NEGATIVE
Ketones, ur: NEGATIVE mg/dL
LEUKOCYTES UA: NEGATIVE
NITRITE: NEGATIVE
PH: 6 (ref 5.0–8.0)
Protein, ur: NEGATIVE mg/dL
Specific Gravity, Urine: 1.012 (ref 1.005–1.030)
WBC, UA: NONE SEEN WBC/hpf (ref 0–5)

## 2016-10-07 LAB — CBC
HEMATOCRIT: 42.5 % (ref 35.0–47.0)
HEMOGLOBIN: 14.4 g/dL (ref 12.0–16.0)
MCH: 29.9 pg (ref 26.0–34.0)
MCHC: 34 g/dL (ref 32.0–36.0)
MCV: 88 fL (ref 80.0–100.0)
Platelets: 254 10*3/uL (ref 150–440)
RBC: 4.83 MIL/uL (ref 3.80–5.20)
RDW: 14.1 % (ref 11.5–14.5)
WBC: 15.5 10*3/uL — AB (ref 3.6–11.0)

## 2016-10-07 LAB — LIPASE, BLOOD: Lipase: 19 U/L (ref 11–51)

## 2016-10-07 MED ORDER — MORPHINE SULFATE (PF) 4 MG/ML IV SOLN: 4.0000 mg | Freq: Once | INTRAVENOUS | Status: DC

## 2016-10-07 MED ORDER — ACETAMINOPHEN 325 MG PO TABS: 650.0000 mg | ORAL_TABLET | Freq: Four times a day (QID) | ORAL | Status: DC | PRN

## 2016-10-07 MED ORDER — SODIUM CHLORIDE 0.9 % IV BOLUS (SEPSIS)
500.0000 mL | Freq: Once | INTRAVENOUS | Status: AC
  Administered 2016-10-07: 500 mL via INTRAVENOUS

## 2016-10-07 MED ORDER — ALBUTEROL SULFATE (2.5 MG/3ML) 0.083% IN NEBU: 3.0000 mL | INHALATION_SOLUTION | Freq: Four times a day (QID) | RESPIRATORY_TRACT | Status: DC | PRN

## 2016-10-07 MED ORDER — ROSUVASTATIN CALCIUM 10 MG PO TABS
10.0000 mg | ORAL_TABLET | Freq: Every day | ORAL | Status: DC
  Administered 2016-10-08 – 2016-10-09 (×2): 10 mg via ORAL
  Filled 2016-10-07 (×2): qty 1

## 2016-10-07 MED ORDER — ONDANSETRON HCL 4 MG/2ML IJ SOLN: 4.0000 mg | Freq: Four times a day (QID) | INTRAMUSCULAR | Status: DC | PRN

## 2016-10-07 MED ORDER — MORPHINE SULFATE (PF) 4 MG/ML IV SOLN
4.0000 mg | Freq: Once | INTRAVENOUS | Status: AC
  Administered 2016-10-07: 4 mg via INTRAVENOUS
  Filled 2016-10-07: qty 1

## 2016-10-07 MED ORDER — MORPHINE SULFATE (PF) 4 MG/ML IV SOLN
4.0000 mg | Freq: Once | INTRAVENOUS | Status: AC
  Administered 2016-10-07: 4 mg via INTRAVENOUS

## 2016-10-07 MED ORDER — HYDROCHLOROTHIAZIDE 25 MG PO TABS
12.5000 mg | ORAL_TABLET | Freq: Every day | ORAL | Status: DC
  Administered 2016-10-08 – 2016-10-09 (×2): 12.5 mg via ORAL
  Filled 2016-10-07 (×2): qty 1

## 2016-10-07 MED ORDER — ACETAMINOPHEN 650 MG RE SUPP
650.0000 mg | Freq: Four times a day (QID) | RECTAL | Status: DC | PRN
  Filled 2016-10-07: qty 1

## 2016-10-07 MED ORDER — PIPERACILLIN-TAZOBACTAM 3.375 G IVPB 30 MIN
3.3750 g | Freq: Once | INTRAVENOUS | Status: AC
  Administered 2016-10-07: 3.375 g via INTRAVENOUS
  Filled 2016-10-07: qty 50

## 2016-10-07 MED ORDER — PANTOPRAZOLE SODIUM 40 MG PO TBEC
40.0000 mg | DELAYED_RELEASE_TABLET | Freq: Every day | ORAL | Status: DC
  Administered 2016-10-08 – 2016-10-09 (×2): 40 mg via ORAL
  Filled 2016-10-07 (×2): qty 1

## 2016-10-07 MED ORDER — PIPERACILLIN-TAZOBACTAM 3.375 G IVPB
3.3750 g | Freq: Three times a day (TID) | INTRAVENOUS | Status: DC
  Administered 2016-10-08: 3.375 g via INTRAVENOUS
  Filled 2016-10-07 (×2): qty 50

## 2016-10-07 MED ORDER — MECLIZINE HCL 25 MG PO TABS
25.0000 mg | ORAL_TABLET | Freq: Three times a day (TID) | ORAL | Status: DC | PRN
  Filled 2016-10-07: qty 1

## 2016-10-07 MED ORDER — IOPAMIDOL (ISOVUE-300) INJECTION 61%
100.0000 mL | Freq: Once | INTRAVENOUS | Status: AC | PRN
  Administered 2016-10-07: 100 mL via INTRAVENOUS

## 2016-10-07 MED ORDER — METOPROLOL SUCCINATE ER 100 MG PO TB24
100.0000 mg | ORAL_TABLET | Freq: Every day | ORAL | Status: DC
  Administered 2016-10-08 – 2016-10-09 (×2): 100 mg via ORAL
  Filled 2016-10-07 (×2): qty 1

## 2016-10-07 MED ORDER — MORPHINE SULFATE (PF) 4 MG/ML IV SOLN
INTRAVENOUS | Status: AC
  Filled 2016-10-07: qty 1

## 2016-10-07 MED ORDER — ONDANSETRON HCL 4 MG/2ML IJ SOLN
4.0000 mg | Freq: Once | INTRAMUSCULAR | Status: AC
  Administered 2016-10-07: 4 mg via INTRAVENOUS
  Filled 2016-10-07: qty 2

## 2016-10-07 MED ORDER — KETOROLAC TROMETHAMINE 30 MG/ML IJ SOLN: 15.0000 mg | Freq: Four times a day (QID) | INTRAMUSCULAR | Status: DC | PRN

## 2016-10-07 MED ORDER — ENOXAPARIN SODIUM 40 MG/0.4ML ~~LOC~~ SOLN
40.0000 mg | SUBCUTANEOUS | Status: DC
  Administered 2016-10-08: 40 mg via SUBCUTANEOUS
  Filled 2016-10-07: qty 0.4

## 2016-10-07 MED ORDER — IOPAMIDOL (ISOVUE-300) INJECTION 61%: 30.0000 mL | Freq: Once | INTRAVENOUS | Status: DC | PRN

## 2016-10-07 MED ORDER — ONDANSETRON HCL 4 MG PO TABS: 4.0000 mg | ORAL_TABLET | Freq: Four times a day (QID) | ORAL | Status: DC | PRN

## 2016-10-07 MED ORDER — SODIUM CHLORIDE 0.9 % IV SOLN
INTRAVENOUS | Status: DC
  Administered 2016-10-08 (×2): via INTRAVENOUS

## 2016-10-07 NOTE — ED Notes (Signed)
Pt sleeping.  Pt waiting on ready bed assignment.

## 2016-10-07 NOTE — ED Triage Notes (Addendum)
States she was diagnosed with diverticulitis last month and given abx, states nausea, states continued abd pain for 4 days, lower abd pain below belly button, pt awake and alert

## 2016-10-07 NOTE — ED Notes (Signed)
Pain meds given.   Family with pt.   

## 2016-10-07 NOTE — ED Notes (Signed)
Urine sample sent

## 2016-10-07 NOTE — ED Notes (Signed)
Pt alert.   Waiting on room assignment.

## 2016-10-07 NOTE — ED Notes (Signed)
Report callled to floor nurse elaine rn

## 2016-10-07 NOTE — H&P (Signed)
[facility] [place] [facility]   PATIENT NAME: Kim BorneJosie Harding    MR#:  [id number]  DATE OF BIRTH:  February 03, 1946  DATE OF ADMISSION:  10/07/2016  PRIMARY CARE PHYSICIAN: Nicki ReaperBAITY, REGINA, NP   REQUESTING/REFERRING PHYSICIAN: Dr. Alphonzo LemmingsMcShane  CHIEF COMPLAINT:   Abdominal pain left-sided for 3 days HISTORY OF PRESENT ILLNESS:  Kim Harding  is a 71 y.o. female with a known history of Hyperlipidemia, hypertension comes to the emergency room after she having abdominal pain and left flank on and off for last 3 days gotten worse to the point she was getting very uncomfortable denies any bloody stools. Complains of some nausea. CT scan of the abdomen done in the ER showed diverticulitis descending colon and proximal sigmoid colon. Patient received IV Zosyn. Her white count is 15,000.  Patient had similar symptoms as outpatient was treated with Cipro and Flagyl for 10 days about a month ago.  Patient is being admitted with recurrent acute sigmoid/descending colon diverticulitis.  PAST MEDICAL HISTORY:   Past Medical History:  Diagnosis Date  . Allergy   . Arthritis   . Chicken pox   . Diabetes mellitus without complication (HCC)   . Diverticulitis   . Hyperlipidemia   . Hypertension     PAST SURGICAL HISTOIRY:   Past Surgical History:  Procedure Laterality Date  . ABDOMINAL HYSTERECTOMY  1994   partial  . BARIATRIC SURGERY     sleeve--2014  . CHOLECYSTECTOMY  2012  . REPLACEMENT TOTAL KNEE BILATERAL    . ROTATOR CUFF REPAIR Right 11/21/2015  . SHOULDER SURGERY Left 09/2014   rotator cuff  . TONSILLECTOMY  1960    SOCIAL HISTORY:   Social History  Substance Use Topics  . Smoking status: Current Every Day Smoker    Packs/day: 0.20    Years: 50.00    Types: Cigarettes  . Smokeless tobacco: Never Used  . Alcohol use No    FAMILY HISTORY:   Family History  Problem Relation Age of Onset  . Heart disease Mother   . Arthritis Father   . Cancer  Father     Prostate  . Diabetes Sister   . Diabetes Brother   . Breast cancer Neg Hx     DRUG ALLERGIES:   Allergies  Allergen Reactions  . Aspirin Hives  . Darvon [Propoxyphene] Hives  . Latex Hives, Itching and Swelling  . Naproxen Hives and Itching    REVIEW OF SYSTEMS:  Review of Systems  Constitutional: Negative for chills, fever and weight loss.  HENT: Negative for ear discharge, ear pain and nosebleeds.   Eyes: Negative for blurred vision, pain and discharge.  Respiratory: Negative for sputum production, shortness of breath, wheezing and stridor.   Cardiovascular: Negative for chest pain, palpitations, orthopnea and PND.  Gastrointestinal: Positive for abdominal pain and nausea. Negative for diarrhea and vomiting.  Genitourinary: Negative for frequency and urgency.  Musculoskeletal: Negative for back pain and joint pain.  Neurological: Positive for weakness. Negative for sensory change, speech change and focal weakness.  Psychiatric/Behavioral: Negative for depression and hallucinations. The patient is not nervous/anxious.      MEDICATIONS AT HOME:   Prior to Admission medications   Medication Sig Start Date End Date Taking? Authorizing Provider  albuterol (PROVENTIL HFA;VENTOLIN HFA) 108 (90 BASE) MCG/ACT inhaler Inhale 2 puffs into the lungs every 6 (six) hours as needed for wheezing or shortness of breath. 08/16/14  Yes Lorre Munroeegina W Baity, NP  Cholecalciferol (VITAMIN D-3) 1000  UNITS CAPS Take 1 capsule by mouth daily.   Yes Historical Provider, MD  esomeprazole (NEXIUM) 40 MG capsule TAKE 1 CAPSULE DAILY AT 12  NOON 09/23/16  Yes Lorre Munroe, NP  hydrochlorothiazide (HYDRODIURIL) 12.5 MG tablet Take 1 tablet (12.5 mg total) by mouth daily. 09/02/16  Yes Dianne Dun, MD  meclizine (ANTIVERT) 25 MG tablet Take 1 tablet (25 mg total) by mouth 3 (three) times daily as needed for dizziness. 09/21/15  Yes Loleta Rose, MD  metFORMIN (GLUCOPHAGE) 500 MG tablet TAKE 1 TABLET  TWICE A DAY WITH MEALS 05/24/16  Yes Lorre Munroe, NP  metoprolol succinate (TOPROL-XL) 100 MG 24 hr tablet Take 1 tablet (100 mg total) by mouth daily. Take with or immediately following a meal. 09/02/16  Yes Dianne Dun, MD  potassium chloride SA (K-DUR,KLOR-CON) 20 MEQ tablet Take 1 tablet (20 mEq total) by mouth daily. 09/09/16  Yes Lorre Munroe, NP  rosuvastatin (CRESTOR) 10 MG tablet TAKE 1 TABLET DAILY 09/23/16  Yes Lorre Munroe, NP      VITAL SIGNS:  Blood pressure 121/72, pulse 91, temperature 98.8 F (37.1 C), temperature source Oral, resp. rate (!) 21, height 5\' 6"  (1.676 m), weight 107 kg (236 lb), SpO2 92 %.  PHYSICAL EXAMINATION:  GENERAL:  71 y.o.-year-old patient lying in the bed with no acute distress.  EYES: Pupils equal, round, reactive to light and accommodation. No scleral icterus. Extraocular muscles intact.  HEENT: Head atraumatic, normocephalic. Oropharynx and nasopharynx clear.  NECK:  Supple, no jugular venous distention. No thyroid enlargement, no tenderness.  LUNGS: Normal breath sounds bilaterally, no wheezing, rales,rhonchi or crepitation. No use of accessory muscles of respiration.  CARDIOVASCULAR: S1, S2 normal. No murmurs, rubs, or gallops.  ABDOMEN: Soft, tender++ in the left flank, nondistended. Bowel sounds present. No organomegaly or mass.  EXTREMITIES: No pedal edema, cyanosis, or clubbing.  NEUROLOGIC: Cranial nerves II through XII are intact. Muscle strength 5/5 in all extremities. Sensation intact. Gait not checked.  PSYCHIATRIC: patient is alert and oriented x 3.  SKIN: No obvious rash, lesion, or ulcer.   LABORATORY PANEL:   CBC  Recent Labs Lab 10/07/16 1040  WBC 15.5*  HGB 14.4  HCT 42.5  PLT 254   ------------------------------------------------------------------------------------------------------------------  Chemistries   Recent Labs Lab 10/07/16 1040  NA 138  K 3.8  CL 104  CO2 26  GLUCOSE 129*  BUN 7  CREATININE  0.74  CALCIUM 9.3  AST 24  ALT 21  ALKPHOS 68  BILITOT 0.7   ------------------------------------------------------------------------------------------------------------------  Cardiac Enzymes No results for input(s): TROPONINI in the last 168 hours. ------------------------------------------------------------------------------------------------------------------  RADIOLOGY:  Ct Abdomen Pelvis W Contrast  Result Date: 10/07/2016 CLINICAL DATA:  71 year old female with generalized abdominal pain for the past 4 days. History of diverticulitis. Post gastric sleeve procedure and hysterectomy. Initial encounter. EXAM: CT ABDOMEN AND PELVIS WITH CONTRAST TECHNIQUE: Multidetector CT imaging of the abdomen and pelvis was performed using the standard protocol following bolus administration of intravenous contrast. CONTRAST:  ISOVUE-300 IOPAMIDOL (ISOVUE-300) INJECTION 61% COMPARISON:  None. FINDINGS: Lower chest: Minimal atelectasis lung bases. Heart size within normal limits. Prominent coronary artery calcification. Aortic valve calcification. Hepatobiliary: Fatty liver. 1 cm cyst left lobe liver. Post cholecystectomy. Pancreas: No mass, inflammation or pancreatic duct dilation. Spleen: No mass or enlargement. Adrenals/Urinary Tract: No renal or ureteral obstructing stone or evidence hydronephrosis. Left lower pole 1 cm cyst. No adrenal lesion. Noncontrast filled views of the urinary bladder unremarkable.  Stomach/Bowel: Inflammatory process at the junction of the descending colon and proximal sigmoid colon most suggestive of diverticulitis with infiltration of surrounding fat planes without drainable abscess. As there is minimally asymmetric wall thickening, after acute episode has cleared, evaluation of this aspect of colon will be necessary to exclude underlying mass. Post gastric sleeve procedure.  Small hiatal hernia. Vascular/Lymphatic: Atherosclerotic changes aorta and iliac arteries without  aneurysmal dilation or large vessel occlusion. Scattered small lymph nodes possibly reactive in origin. Reproductive: Post hysterectomy. Left ovary adjacent to left bowel inflammatory process. Between the inferior margin of the right ovary and bladder is a 3 x 2.8 x 2 cm cystic collection which does not appear to arise from the bladder and although adjacent to the ovary does not appear to arise from the ovary. This may be related to prior hysterectomy and a small postoperative seroma. Other: No free intraperitoneal air. Musculoskeletal: Degenerative changes lower thoracic and lumbar spine. Sacroiliac joint degenerative changes. IMPRESSION: Inflammatory process at the junction of the descending colon and proximal sigmoid colon most suggestive of diverticulitis with infiltration of surrounding fat planes without drainable abscess. As there is minimally asymmetric wall thickening, after acute episode has cleared, evaluation of this aspect of colon will be necessary to exclude underlying mass. Post gastric sleeve procedure.  Small hiatal hernia. Between the inferior margin of the right ovary and bladder is a 3 x 2.8 x 2 cm cystic collection which does not appear to arise from the bladder and although adjacent to the ovary does not appear to arise from the ovary. This may be related to prior hysterectomy and a small postoperative seroma. Ovarian cyst is a secondary consideration. Aortic atherosclerosis. Coronary artery calcifications. Fatty liver.  1 cm cyst left lobe liver.  Post cholecystectomy. These results were called by telephone at the time of interpretation on 10/07/2016 at 3:13 pm to Dr. Ileana Roup , who verbally acknowledged these results. Electronically Signed   By: Lacy Duverney M.D.   On: 10/07/2016 15:20    EKG:    IMPRESSION AND PLAN:   Amahia Madonia  is a 71 y.o. female with a known history of Hyperlipidemia, hypertension comes to the emergency room after she having abdominal pain and left  flank on and off for last 3 days gotten worse to the point she was getting very uncomfortable denies any bloody stools. Complains of some nausea. CT scan of the abdomen done in the ER showed diverticulitis descending colon and proximal sigmoid colon.  1. Acute recurrent diverticulitis sigmoid/descending colon -Admit to medical floor -Clear liquid diet -IV Zosyn -Consider surgical consultation if symptoms are not improved. CT abdomen does not show any evidence of drainable abscess  2. Leukocytosis secondary to #1 follow fever curve and white count  3. Hypertension continue home meds  4. DVT prophylaxis continue Lovenox All the records are reviewed and case discussed with ED provider. Management plans discussed with the patient, family and they are in agreement.  CODE STATUS: Full  TOTAL TIME TAKING CARE OF THIS PATIENT: 45 minutes.    Raveena Hebdon M.D on 10/07/2016 at 8:59 PM  Between 7am to 6pm - Pager - [phone]  After 6pm go to [email or web address] - password [email or web address] [facility]  [facility]  Office  [phone]  CC: Primary care physician; Nicki Reaper, NP

## 2016-10-07 NOTE — ED Notes (Signed)
Pt waiting on admisssion.

## 2016-10-07 NOTE — ED Notes (Signed)
Pt to ed with c/o abd pain all over that radiates through to back.  Pt was diagnosed with diverticulitis last month and given abx, now reports nausea and constant abd pain for 4 days, lower abd pain below belly button, pt awake and alert, skin warm and dry.  bs +x 4

## 2016-10-07 NOTE — ED Notes (Addendum)
Resumed care from tina rn.  Pt alert.  Watching tgv.  nsr on monitor.   Skin warm and dry.  Iv in place.

## 2016-10-07 NOTE — ED Notes (Signed)
Pt made aware she still needs to obtain urine.

## 2016-10-07 NOTE — ED Provider Notes (Addendum)
,[facility] Emergency Department Provider Note  ____________________________________________   I have reviewed the triage vital signs and the nursing notes.   HISTORY  Chief Complaint Abdominal Pain    HPI Kim Harding is a 71 y.o. female Libby Maw today complaining of left lower quadrant abdominal pain which started 3-4 days ago history of diverticulitis no history of surgeries for same. Was on a box about a month ago for. Denies any fever or vomiting. Has had nausea. Pain was gradual in onset. Is mostly in the left side. No dysuria or urinary symptoms. Feels similar but more intense to her prior diverticulitis. Patient states she has an allergy to Darvocet but he said morphine may times with no difficulty.     Past Medical History:  Diagnosis Date  . Allergy   . Arthritis   . Chicken pox   . Diverticulitis   . Hyperlipidemia   . Hypertension     Patient Active Problem List   Diagnosis Date Noted  . Arthritis 12/13/2015  . Tobacco abuse 09/06/2014  . OSA on CPAP 09/06/2014  . Diabetes mellitus type 2, controlled, without complications ([place]) 07/62/2633  . Severe obesity (BMI >= 40) ([place]) 08/09/2014  . Essential hypertension 08/09/2014  . HLD (hyperlipidemia) 08/09/2014  . GERD (gastroesophageal reflux disease) 08/09/2014  . COPD, mild ([place]) 08/09/2014    Past Surgical History:  Procedure Laterality Date  . ABDOMINAL HYSTERECTOMY  1994   partial  . BARIATRIC SURGERY     sleeve--2014  . CHOLECYSTECTOMY  2012  . REPLACEMENT TOTAL KNEE BILATERAL    . ROTATOR CUFF REPAIR Right 11/21/2015  . SHOULDER SURGERY Left 09/2014   rotator cuff  . TONSILLECTOMY  1960    Prior to Admission medications   Medication Sig Start Date End Date Taking? Authorizing Provider  albuterol (PROVENTIL HFA;VENTOLIN HFA) 108 (90 BASE) MCG/ACT inhaler Inhale 2 puffs into the lungs every 6 (six) hours as needed for wheezing or shortness of breath. 08/16/14   Jearld Fenton, NP  Blood Glucose Monitoring Suppl (ONE TOUCH ULTRA 2) w/Device KIT 1 Device by Does not apply route once. 11/13/15   Jearld Fenton, NP  Cholecalciferol (VITAMIN D-3) 1000 UNITS CAPS Take 1 capsule by mouth daily.    Historical Provider, MD  ciprofloxacin (CIPRO) 500 MG tablet Take 1 tablet (500 mg total) by mouth 2 (two) times daily. 09/05/16   Jearld Fenton, NP  esomeprazole (NEXIUM) 40 MG capsule TAKE 1 CAPSULE DAILY AT 12  NOON 09/23/16   Jearld Fenton, NP  glucose blood (ONE TOUCH TEST STRIPS) test strip 1 each by Other route 3 (three) times daily. Use as instructed 11/13/15   Jearld Fenton, NP  hydrochlorothiazide (HYDRODIURIL) 12.5 MG tablet Take 1 tablet (12.5 mg total) by mouth daily. 09/02/16   Lucille Passy, MD  ibuprofen (ADVIL,MOTRIN) 800 MG tablet Take 1 tablet (800 mg total) by mouth every 8 (eight) hours as needed. 09/13/14   Jearld Fenton, NP  meclizine (ANTIVERT) 25 MG tablet Take 1 tablet (25 mg total) by mouth 3 (three) times daily as needed for dizziness. 09/21/15   Hinda Kehr, MD  metFORMIN (GLUCOPHAGE) 500 MG tablet TAKE 1 TABLET TWICE A DAY WITH MEALS 05/24/16   Jearld Fenton, NP  metoprolol succinate (TOPROL-XL) 100 MG 24 hr tablet Take 1 tablet (100 mg total) by mouth daily. Take with or immediately following a meal. 09/02/16   Lucille Passy, MD  metroNIDAZOLE (FLAGYL) 500  MG tablet Take 1 tablet (500 mg total) by mouth 3 (three) times daily. 09/05/16   Jearld Fenton, NP  Naltrexone-Bupropion HCl ER 8-90 MG TB12 Take 1 tab daily x 1 week, then increase to 1 tab BID x 1 week, then increase to 2 tabs in am, 1 in pm x 1 week, then take 2 tabs BID thereafter 07/30/16   Jearld Fenton, NP  ONE TOUCH LANCETS MISC 1 each by Does not apply route 3 (three) times daily. 11/13/15   Jearld Fenton, NP  potassium chloride SA (K-DUR,KLOR-CON) 20 MEQ tablet Take 1 tablet (20 mEq total) by mouth daily. 09/09/16   Jearld Fenton, NP  rosuvastatin (CRESTOR) 10 MG tablet TAKE 1 TABLET DAILY  09/23/16   Jearld Fenton, NP    Allergies Aspirin; Darvon [propoxyphene]; Latex; and Naproxen  Family History  Problem Relation Age of Onset  . Heart disease Mother   . Arthritis Father   . Cancer Father     Prostate  . Diabetes Sister   . Diabetes Brother   . Breast cancer Neg Hx     Social History Social History  Substance Use Topics  . Smoking status: Current Every Day Smoker    Packs/day: 0.20    Years: 50.00    Types: Cigarettes  . Smokeless tobacco: Never Used  . Alcohol use No    Review of Systems Constitutional: No fever/chills Eyes: No visual changes. ENT: No sore throat. No stiff neck no neck pain Cardiovascular: Denies chest pain. Respiratory: Denies shortness of breath. Gastrointestinal:   no vomiting.  No diarrhea.  No constipation. Genitourinary: Negative for dysuria. Musculoskeletal: Negative lower extremity swelling Skin: Negative for rash. Neurological: Negative for severe headaches, focal weakness or numbness. 10-point ROS otherwise negative.  ____________________________________________   PHYSICAL EXAM:  VITAL SIGNS: ED Triage Vitals  Enc Vitals Group     BP 10/07/16 1040 (!) 150/70     Pulse Rate 10/07/16 1040 91     Resp 10/07/16 1040 18     Temp 10/07/16 1040 98.8 F (37.1 C)     Temp Source 10/07/16 1040 Oral     SpO2 10/07/16 1040 100 %     Weight 10/07/16 1040 236 lb (107 kg)     Height 10/07/16 1040 5' 6"  (1.676 m)     Head Circumference --      Peak Flow --      Pain Score 10/07/16 1041 9     Pain Loc --      Pain Edu? --      Excl. in [place]? --     Constitutional: Alert and oriented. Well appearing and in no acute distress. Eyes: Conjunctivae are normal. PERRL. EOMI. Head: Atraumatic. Nose: No congestion/rhinnorhea. Mouth/Throat: Mucous membranes are moist.  Oropharynx non-erythematous. Neck: No stridor.   Nontender with no meningismus Cardiovascular: Normal rate, regular rhythm. Grossly normal heart sounds.  Good  peripheral circulation. Respiratory: Normal respiratory effort.  No retractions. Lungs CTAB. Abdominal: Soft and Obese with left lower quadrant tenderness with voluntary guarding but no rebound nonsurgical abdomen Back:  There is no focal tenderness or step off.  there is no midline tenderness there are no lesions noted. there is no CVA tenderness Musculoskeletal: No lower extremity tenderness, no upper extremity tenderness. No joint effusions, no DVT signs strong distal pulses no edema Neurologic:  Normal speech and language. No gross focal neurologic deficits are appreciated.  Skin:  Skin is warm, dry and intact. No rash  noted. Psychiatric: Mood and affect are normal. Speech and behavior are normal.  ____________________________________________   LABS (all labs ordered are listed, but only abnormal results are displayed)  Labs Reviewed  COMPREHENSIVE METABOLIC PANEL - Abnormal; Notable for the following:       Result Value   Glucose, Bld 129 (*)    All other components within normal limits  CBC - Abnormal; Notable for the following:    WBC 15.5 (*)    All other components within normal limits  LIPASE, BLOOD  URINALYSIS, COMPLETE (UACMP) WITH MICROSCOPIC   ____________________________________________  EKG  I personally interpreted any EKGs ordered by me or triage  ____________________________________________  RADIOLOGY  I reviewed any imaging ordered by me or triage that were performed during my shift and, if possible, patient and/or family made aware of any abnormal findings. ____________________________________________   PROCEDURES  Procedure(s) performed: None  Procedures  Critical Care performed: None  ____________________________________________   INITIAL IMPRESSION / ASSESSMENT AND PLAN / ED COURSE  Pertinent labs & imaging results that were available during my care of the patient were reviewed by me and considered in my medical decision making (see chart  for details).  Patient with left lower quadrant abdominal pain and high white count consistent with possible recurrent diverticulitis unfortunately we will have to do a CT scan, obesity limits exam somewhat, also possibility of perforation or abscess exists after 4 days of symptoms. We will treat her somatic with IV fluids, we'll give her pain medication and nausea medication. I want her to give her antibiotics pending CT and we will see what the CT shows. ----------------------------------------- 2:07 PM on 10/07/2016 -----------------------------------------  Pt 3/10 pain. Awaiting ct.    ____________________________________________   FINAL CLINICAL IMPRESSION(S) / ED DIAGNOSES  Final diagnoses:  None      This chart was dictated using voice recognition software.  Despite best efforts to proofread,  errors can occur which can change meaning.      Schuyler Amor, MD 10/07/16 [place], MD 10/07/16 [phone]

## 2016-10-07 NOTE — ED Notes (Signed)
Dr patel with [facility] in with pt now.

## 2016-10-08 ENCOUNTER — Ambulatory Visit: Payer: Medicare Other | Admitting: Internal Medicine

## 2016-10-08 LAB — CBC
HEMATOCRIT: 35.9 % (ref 35.0–47.0)
HEMOGLOBIN: 12.5 g/dL (ref 12.0–16.0)
MCH: 30.3 pg (ref 26.0–34.0)
MCHC: 34.8 g/dL (ref 32.0–36.0)
MCV: 87 fL (ref 80.0–100.0)
Platelets: 232 10*3/uL (ref 150–440)
RBC: 4.13 MIL/uL (ref 3.80–5.20)
RDW: 14.5 % (ref 11.5–14.5)
WBC: 13.7 10*3/uL — ABNORMAL HIGH (ref 3.6–11.0)

## 2016-10-08 MED ORDER — TRAMADOL HCL 50 MG PO TABS: 50.0000 mg | ORAL_TABLET | Freq: Four times a day (QID) | ORAL | Status: DC | PRN

## 2016-10-08 MED ORDER — VITAMIN D 1000 UNITS PO TABS
1000.0000 [IU] | ORAL_TABLET | Freq: Every day | ORAL | Status: DC
  Administered 2016-10-08 – 2016-10-09 (×2): 1000 [IU] via ORAL
  Filled 2016-10-08 (×2): qty 1

## 2016-10-08 MED ORDER — PIPERACILLIN-TAZOBACTAM 3.375 G IVPB
3.3750 g | Freq: Three times a day (TID) | INTRAVENOUS | Status: DC
Start: 2016-10-08 — End: 2016-10-09
  Administered 2016-10-08 – 2016-10-09 (×4): 3.375 g via INTRAVENOUS
  Filled 2016-10-08 (×6): qty 50

## 2016-10-08 NOTE — Progress Notes (Signed)
[facility] - [place] [facility]   PATIENT NAME: Kim BorneJosie Harding    MR#:  [id number]  DATE OF BIRTH:  [birth year]  SUBJECTIVE:  CHIEF COMPLAINT:   Chief Complaint  Patient presents with  . Abdominal Pain    was treated last month for diverticulitis, took total 10 days of oral treatment.  Came with left lower quadrant pain and found to have diveticultis .   Feels better after treatment, and asking to upgrade the diet.  REVIEW OF SYSTEMS:  CONSTITUTIONAL: No fever, fatigue or weakness.  EYES: No blurred or double vision.  EARS, NOSE, AND THROAT: No tinnitus or ear pain.  RESPIRATORY: No cough, shortness of breath, wheezing or hemoptysis.  CARDIOVASCULAR: No chest pain, orthopnea, edema.  GASTROINTESTINAL: No nausea, vomiting, diarrhea or abdominal pain.  GENITOURINARY: No dysuria, hematuria.  ENDOCRINE: No polyuria, nocturia,  HEMATOLOGY: No anemia, easy bruising or bleeding SKIN: No rash or lesion. MUSCULOSKELETAL: No joint pain or arthritis.   NEUROLOGIC: No tingling, numbness, weakness.  PSYCHIATRY: No anxiety or depression.   ROS  DRUG ALLERGIES:   Allergies  Allergen Reactions  . Aspirin Hives  . Darvon [Propoxyphene] Hives  . Latex Hives, Itching and Swelling  . Naproxen Hives and Itching    VITALS:  Blood pressure (!) 115/58, pulse 78, temperature 98.6 F (37 C), temperature source Oral, resp. rate 20, height 5\' 6"  (1.676 m), weight 107 kg (236 lb), SpO2 99 %.  PHYSICAL EXAMINATION:  GENERAL:  71 y.o.-year-old patient lying in the bed with no acute distress.  EYES: Pupils equal, round, reactive to light and accommodation. No scleral icterus. Extraocular muscles intact.  HEENT: Head atraumatic, normocephalic. Oropharynx and nasopharynx clear.  NECK:  Supple, no jugular venous distention. No thyroid enlargement, no tenderness.  LUNGS: Normal breath sounds bilaterally, no wheezing, rales,rhonchi or crepitation. No use of accessory muscles of  respiration.  CARDIOVASCULAR: S1, S2 normal. No murmurs, rubs, or gallops.  ABDOMEN: Soft, nontender, nondistended. Bowel sounds present. No organomegaly or mass.  EXTREMITIES: No pedal edema, cyanosis, or clubbing.  NEUROLOGIC: Cranial nerves II through XII are intact. Muscle strength 5/5 in all extremities. Sensation intact. Gait not checked.  PSYCHIATRIC: The patient is alert and oriented x 3.  SKIN: No obvious rash, lesion, or ulcer.   Physical Exam LABORATORY PANEL:   CBC  Recent Labs Lab 10/08/16 0425  WBC 13.7*  HGB 12.5  HCT 35.9  PLT 232   ------------------------------------------------------------------------------------------------------------------  Chemistries   Recent Labs Lab 10/07/16 1040  NA 138  K 3.8  CL 104  CO2 26  GLUCOSE 129*  BUN 7  CREATININE 0.74  CALCIUM 9.3  AST 24  ALT 21  ALKPHOS 68  BILITOT 0.7   ------------------------------------------------------------------------------------------------------------------  Cardiac Enzymes No results for input(s): TROPONINI in the last 168 hours. ------------------------------------------------------------------------------------------------------------------  RADIOLOGY:  Ct Abdomen Pelvis W Contrast  Result Date: 10/07/2016 CLINICAL DATA:  71 year old female with generalized abdominal pain for the past 4 days. History of diverticulitis. Post gastric sleeve procedure and hysterectomy. Initial encounter. EXAM: CT ABDOMEN AND PELVIS WITH CONTRAST TECHNIQUE: Multidetector CT imaging of the abdomen and pelvis was performed using the standard protocol following bolus administration of intravenous contrast. CONTRAST:  100mL ISOVUE-300 IOPAMIDOL (ISOVUE-300) INJECTION 61% COMPARISON:  None. FINDINGS: Lower chest: Minimal atelectasis lung bases. Heart size within normal limits. Prominent coronary artery calcification. Aortic valve calcification. Hepatobiliary: Fatty liver. 1 cm cyst left lobe liver. Post  cholecystectomy. Pancreas: No mass, inflammation or pancreatic duct dilation. Spleen: No  mass or enlargement. Adrenals/Urinary Tract: No renal or ureteral obstructing stone or evidence hydronephrosis. Left lower pole 1 cm cyst. No adrenal lesion. Noncontrast filled views of the urinary bladder unremarkable. Stomach/Bowel: Inflammatory process at the junction of the descending colon and proximal sigmoid colon most suggestive of diverticulitis with infiltration of surrounding fat planes without drainable abscess. As there is minimally asymmetric wall thickening, after acute episode has cleared, evaluation of this aspect of colon will be necessary to exclude underlying mass. Post gastric sleeve procedure.  Small hiatal hernia. Vascular/Lymphatic: Atherosclerotic changes aorta and iliac arteries without aneurysmal dilation or large vessel occlusion. Scattered small lymph nodes possibly reactive in origin. Reproductive: Post hysterectomy. Left ovary adjacent to left bowel inflammatory process. Between the inferior margin of the right ovary and bladder is a 3 x 2.8 x 2 cm cystic collection which does not appear to arise from the bladder and although adjacent to the ovary does not appear to arise from the ovary. This may be related to prior hysterectomy and a small postoperative seroma. Other: No free intraperitoneal air. Musculoskeletal: Degenerative changes lower thoracic and lumbar spine. Sacroiliac joint degenerative changes. IMPRESSION: Inflammatory process at the junction of the descending colon and proximal sigmoid colon most suggestive of diverticulitis with infiltration of surrounding fat planes without drainable abscess. As there is minimally asymmetric wall thickening, after acute episode has cleared, evaluation of this aspect of colon will be necessary to exclude underlying mass. Post gastric sleeve procedure.  Small hiatal hernia. Between the inferior margin of the right ovary and bladder is a 3 x 2.8 x 2 cm  cystic collection which does not appear to arise from the bladder and although adjacent to the ovary does not appear to arise from the ovary. This may be related to prior hysterectomy and a small postoperative seroma. Ovarian cyst is a secondary consideration. Aortic atherosclerosis. Coronary artery calcifications. Fatty liver.  1 cm cyst left lobe liver.  Post cholecystectomy. These results were called by telephone at the time of interpretation on 10/07/2016 at 3:13 pm to Dr. Ileana Roup , who verbally acknowledged these results. Electronically Signed   By: Lacy Duverney M.D.   On: 10/07/2016 15:20    ASSESSMENT AND PLAN:   Active Problems:   Diverticulitis  Cait Locust  is a 71 y.o. female with a known history of Hyperlipidemia, hypertension comes to the emergency room after she having abdominal pain and left flank on and off for last 3 days gotten worse to the point she was getting very uncomfortable denies any bloody stools. Complains of some nausea. CT scan of the abdomen done in the ER showed diverticulitis descending colon and proximal sigmoid colon.  1. Acute recurrent diverticulitis sigmoid/descending colon -Clear liquid diet- upgrade to soft now. -IV Zosyn -Consider surgical consultation if symptoms are not improved. CT abdomen does not show any evidence of drainable abscess  2. Leukocytosis secondary to #1 follow fever curve and white count  3. Hypertension continue home meds  4. DVT prophylaxis continue Lovenox  5. Active smoking    councelled to quit smoking for 4 min.     All the records are reviewed and case discussed with Care Management/Social Workerr. Management plans discussed with the patient, family and they are in agreement.  CODE STATUS: full.  TOTAL TIME TAKING CARE OF THIS PATIENT: 35 minutes.    POSSIBLE D/C IN 1-2 DAYS, DEPENDING ON CLINICAL CONDITION.   Altamese Dilling M.D on 10/08/2016   Between 7am to 6pm - Pager -  [phone]  After 6pm go to [email or web address] - password [email or web address]  [facility]  Office  [phone]  CC: Primary care physician; Nicki Reaper, NP  Note: This dictation was prepared with Dragon dictation along with smaller phrase technology. Any transcriptional errors that result from this process are unintentional.

## 2016-10-09 LAB — C DIFFICILE QUICK SCREEN W PCR REFLEX
C Diff antigen: NEGATIVE
C Diff interpretation: NOT DETECTED
C Diff toxin: NEGATIVE

## 2016-10-09 MED ORDER — AMOXICILLIN-POT CLAVULANATE 875-125 MG PO TABS: 1.0000 | ORAL_TABLET | Freq: Two times a day (BID) | ORAL | 0 refills | Status: DC

## 2016-10-09 MED ORDER — AMOXICILLIN-POT CLAVULANATE 875-125 MG PO TABS
1.0000 | ORAL_TABLET | Freq: Two times a day (BID) | ORAL | Status: DC
  Administered 2016-10-09: 1 via ORAL
  Filled 2016-10-09: qty 1

## 2016-10-09 NOTE — Care Management Important Message (Signed)
Important Message  Patient Details  Name: Kim Harding MRN: [id number] Date of Birth: 20-Mar-1946   Medicare Important Message Given:  Yes    Gwenette GreetBrenda S Lakayla Barrington, RN 10/09/2016, 8:31 AM

## 2016-10-09 NOTE — Discharge Instructions (Signed)
Diverticulitis Diverticulitis is inflammation or infection of small pouches in your colon that form when you have a condition called diverticulosis. The pouches in your colon are called diverticula. Your colon, or large intestine, is where water is absorbed and stool is formed. Complications of diverticulitis can include:  Bleeding.  Severe infection.  Severe pain.  Perforation of your colon.  Obstruction of your colon. What are the causes? Diverticulitis is caused by bacteria. Diverticulitis happens when stool becomes trapped in diverticula. This allows bacteria to grow in the diverticula, which can lead to inflammation and infection. What increases the risk? People with diverticulosis are at risk for diverticulitis. Eating a diet that does not include enough fiber from fruits and vegetables may make diverticulitis more likely to develop. What are the signs or symptoms? Symptoms of diverticulitis may include:  Abdominal pain and tenderness. The pain is normally located on the left side of the abdomen, but may occur in other areas.  Fever and chills.  Bloating.  Cramping.  Nausea.  Vomiting.  Constipation.  Diarrhea.  Blood in your stool. How is this diagnosed? Your health care provider will ask you about your medical history and do a physical exam. You may need to have tests done because many medical conditions can cause the same symptoms as diverticulitis. Tests may include:  Blood tests.  Urine tests.  Imaging tests of the abdomen, including X-rays and CT scans. When your condition is under control, your health care provider may recommend that you have a colonoscopy. A colonoscopy can show how severe your diverticula are and whether something else is causing your symptoms. How is this treated? Most cases of diverticulitis are mild and can be treated at home. Treatment may include:  Taking over-the-counter pain medicines.  Following a clear liquid diet.  Taking  antibiotic medicines by mouth for 7-10 days. More severe cases may be treated at a hospital. Treatment may include:  Not eating or drinking.  Taking prescription pain medicine.  Receiving antibiotic medicines through an IV tube.  Receiving fluids and nutrition through an IV tube.  Surgery. Follow these instructions at home:  Follow your health care providers instructions carefully.  Follow a full liquid diet or other diet as directed by your health care provider. After your symptoms improve, your health care provider may tell you to change your diet. He or she may recommend you eat a high-fiber diet. Fruits and vegetables are good sources of fiber. Fiber makes it easier to pass stool.  Take fiber supplements or probiotics as directed by your health care provider.  Only take medicines as directed by your health care provider.  Keep all your follow-up appointments. Contact a health care provider if:  Your pain does not improve.  You have a hard time eating food.  Your bowel movements do not return to normal. Get help right away if:  Your pain becomes worse.  Your symptoms do not get better.  Your symptoms suddenly get worse.  You have a fever.  You have repeated vomiting.  You have bloody or black, tarry stools. This information is not intended to replace advice given to you by your health care provider. Make sure you discuss any questions you have with your health care provider. Document Released: 04/17/2005 Document Revised: 12/14/2015 Document Reviewed: 06/02/2013 Elsevier Interactive Patient Education  2017 [employer] Inc[employer]   High-Fiber Diet Fiber, also called dietary fiber, is a type of carbohydrate found in fruits, vegetables, whole grains, and beans. A high-fiber diet can have  many health benefits. Your health care provider may recommend a high-fiber diet to help:  Prevent constipation. Fiber can make your bowel movements more regular.  Lower your  cholesterol.  Relieve hemorrhoids, uncomplicated diverticulosis, or irritable bowel syndrome.  Prevent overeating as part of a weight-loss plan.  Prevent heart disease, type 2 diabetes, and certain cancers. What is my plan? The recommended daily intake of fiber includes:  38 grams for men under age 69.  30 grams for men over age 69.  25 grams for women under age 69.  21 grams for women over age 69. You can get the recommended daily intake of dietary fiber by eating a variety of fruits, vegetables, grains, and beans. Your health care provider may also recommend a fiber supplement if it is not possible to get enough fiber through your diet. What do I need to know about a high-fiber diet?  Fiber supplements have not been widely studied for their effectiveness, so it is better to get fiber through food sources.  Always check the fiber content on thenutrition facts label of any prepackaged food. Look for foods that contain at least 5 grams of fiber per serving.  Ask your dietitian if you have questions about specific foods that are related to your condition, especially if those foods are not listed in the following section.  Increase your daily fiber consumption gradually. Increasing your intake of dietary fiber too quickly may cause bloating, cramping, or gas.  Drink plenty of water. Water helps you to digest fiber. What foods can I eat? Grains  Whole-grain breads. Multigrain cereal. Oats and oatmeal. Brown rice. Barley. Bulgur wheat. Millet. Bran muffins. Popcorn. Rye wafer crackers. Vegetables  Sweet potatoes. Spinach. Kale. Artichokes. Cabbage. Broccoli. Green peas. Carrots. Squash. Fruits  Berries. Pears. Apples. Oranges. Avocados. Prunes and raisins. Dried figs. Meats and Other Protein Sources  Navy, kidney, pinto, and soy beans. Split peas. Lentils. Nuts and seeds. Dairy  Fiber-fortified yogurt. Beverages  Fiber-fortified soy milk. Fiber-fortified orange juice. Other   Fiber bars. The items listed above may not be a complete list of recommended foods or beverages. Contact your dietitian for more options.  What foods are not recommended? Grains  White bread. Pasta made with refined flour. White rice. Vegetables  Fried potatoes. Canned vegetables. Well-cooked vegetables. Fruits  Fruit juice. Cooked, strained fruit. Meats and Other Protein Sources  Fatty cuts of meat. Fried Environmental education officerpoultry or fried fish. Dairy  Milk. Yogurt. Cream cheese. Sour cream. Beverages  Soft drinks. Other  Cakes and pastries. Butter and oils. The items listed above may not be a complete list of foods and beverages to avoid. Contact your dietitian for more information.  What are some tips for including high-fiber foods in my diet?  Eat a wide variety of high-fiber foods.  Make sure that half of all grains consumed each day are whole grains.  Replace breads and cereals made from refined flour or white flour with whole-grain breads and cereals.  Replace white rice with brown rice, bulgur wheat, or millet.  Start the day with a breakfast that is high in fiber, such as a cereal that contains at least 5 grams of fiber per serving.  Use beans in place of meat in soups, salads, or pasta.  Eat high-fiber snacks, such as berries, raw vegetables, nuts, or popcorn. This information is not intended to replace advice given to you by your health care provider. Make sure you discuss any questions you have with your health care provider. Document Released:  07/08/2005 Document Revised: 12/14/2015 Document Reviewed: 12/21/2013 Elsevier Interactive Patient Education  2017 [employer].

## 2016-10-09 NOTE — Discharge Summary (Signed)
[facility] - [place] [facility]   PATIENT NAME: Kim Harding    MR#:  [id number]  DATE OF BIRTH:  1945-10-21  DATE OF ADMISSION:  10/07/2016 ADMITTING PHYSICIAN: Enedina FinnerSona Amylah Will, MD  DATE OF DISCHARGE: 10/09/16  PRIMARY CARE PHYSICIAN: Nicki ReaperBAITY, REGINA, NP    ADMISSION DIAGNOSIS:  Diverticulitis of large intestine without perforation or abscess without bleeding [K57.32]  DISCHARGE DIAGNOSIS:  Recurrent diverticulitis.  SECONDARY DIAGNOSIS:   Past Medical History:  Diagnosis Date  . Allergy   . Arthritis   . Chicken pox   . Diabetes mellitus without complication (HCC)   . Diverticulitis   . Hyperlipidemia   . Hypertension     HOSPITAL COURSE:   Kim Harding a 71 y.o. femalewith a known history of Hyperlipidemia, hypertension comes to the emergency room after she having abdominal pain and left flank on and off for last 3 days gotten worse to the point she was getting very uncomfortable denies any bloody stools. Complains of some nausea. CT scan of the abdomen done in the ER showed diverticulitis descending colon and proximal sigmoid colon.  1. Acute recurrent diverticulitis sigmoid/descending colon -Clear liquid diet- upgrade to soft now. -IV Zosyn---to oral augmentin -. CT abdomen does not show any evidence of drainable abscess -Patient feels a lot better. Very minimal pain. She's been having some runny stools which is negative for C. difficile. Patient is advised to take soft diet and eat yoghurt.  2. Leukocytosis secondary to #1 follow fever curve and white count  3. Hypertension continue home meds  4. DVT prophylaxis continue Lovenox  5. Active smoking    councelled to quit smoking for 4 min.  Improving. D/c to home CONSULTS OBTAINED:    DRUG ALLERGIES:   Allergies  Allergen Reactions  . Aspirin Hives  . Darvon [Propoxyphene] Hives  . Latex Hives, Itching and Swelling  . Naproxen Hives and Itching    DISCHARGE  MEDICATIONS:   Current Discharge Medication List    START taking these medications   Details  amoxicillin-clavulanate (AUGMENTIN) 875-125 MG tablet Take 1 tablet by mouth every 12 (twelve) hours. Qty: 16 tablet, Refills: 0      CONTINUE these medications which have NOT CHANGED   Details  albuterol (PROVENTIL HFA;VENTOLIN HFA) 108 (90 BASE) MCG/ACT inhaler Inhale 2 puffs into the lungs every 6 (six) hours as needed for wheezing or shortness of breath. Qty: 6.7 Inhaler, Refills: 3    Cholecalciferol (VITAMIN D-3) 1000 UNITS CAPS Take 1 capsule by mouth daily.    esomeprazole (NEXIUM) 40 MG capsule TAKE 1 CAPSULE DAILY AT 12  NOON Qty: 90 capsule, Refills: 1    hydrochlorothiazide (HYDRODIURIL) 12.5 MG tablet Take 1 tablet (12.5 mg total) by mouth daily. Qty: 90 tablet, Refills: 1    meclizine (ANTIVERT) 25 MG tablet Take 1 tablet (25 mg total) by mouth 3 (three) times daily as needed for dizziness. Qty: 30 tablet, Refills: 0    metFORMIN (GLUCOPHAGE) 500 MG tablet TAKE 1 TABLET TWICE A DAY WITH MEALS Qty: 180 tablet, Refills: 1    metoprolol succinate (TOPROL-XL) 100 MG 24 hr tablet Take 1 tablet (100 mg total) by mouth daily. Take with or immediately following a meal. Qty: 90 tablet, Refills: 1    potassium chloride SA (K-DUR,KLOR-CON) 20 MEQ tablet Take 1 tablet (20 mEq total) by mouth daily. Qty: 30 tablet, Refills: 2    rosuvastatin (CRESTOR) 10 MG tablet TAKE 1 TABLET DAILY Qty: 90 tablet, Refills: 1  If you experience worsening of your admission symptoms, develop shortness of breath, life threatening emergency, suicidal or homicidal thoughts you must seek medical attention immediately by calling 911 or calling your MD immediately  if symptoms less severe.  You Must read complete instructions/literature along with all the possible adverse reactions/side effects for all the Medicines you take and that have been prescribed to you. Take any new Medicines after you  have completely understood and accept all the possible adverse reactions/side effects.   Please note  You were cared for by a hospitalist during your hospital stay. If you have any questions about your discharge medications or the care you received while you were in the hospital after you are discharged, you can call the unit and asked to speak with the hospitalist on call if the hospitalist that took care of you is not available. Once you are discharged, your primary care physician will handle any further medical issues. Please note that NO REFILLS for any discharge medications will be authorized once you are discharged, as it is imperative that you return to your primary care physician (or establish a relationship with a primary care physician if you do not have one) for your aftercare needs so that they can reassess your need for medications and monitor your lab values. Today   SUBJECTIVE   Some loose stools  VITAL SIGNS:  Blood pressure (!) 145/65, pulse 81, temperature 98 F (36.7 C), temperature source Oral, resp. rate 18, height 5\' 6"  (1.676 m), weight 107 kg (236 lb), SpO2 99 %.  I/O:   Intake/Output Summary (Last 24 hours) at 10/09/16 1418 Last data filed at 10/09/16 1151  Gross per 24 hour  Intake             2195 ml  Output             1801 ml  Net              394 ml    PHYSICAL EXAMINATION:  GENERAL:  71 y.o.-year-old patient lying in the bed with no acute distress.  EYES: Pupils equal, round, reactive to light and accommodation. No scleral icterus. Extraocular muscles intact.  HEENT: Head atraumatic, normocephalic. Oropharynx and nasopharynx clear.  NECK:  Supple, no jugular venous distention. No thyroid enlargement, no tenderness.  LUNGS: Normal breath sounds bilaterally, no wheezing, rales,rhonchi or crepitation. No use of accessory muscles of respiration.  CARDIOVASCULAR: S1, S2 normal. No murmurs, rubs, or gallops.  ABDOMEN: Soft, non-tender, non-distended. Bowel  sounds present. No organomegaly or mass.  EXTREMITIES: No pedal edema, cyanosis, or clubbing.  NEUROLOGIC: Cranial nerves II through XII are intact. Muscle strength 5/5 in all extremities. Sensation intact. Gait not checked.  PSYCHIATRIC: The patient is alert and oriented x 3.  SKIN: No obvious rash, lesion, or ulcer.   DATA REVIEW:   CBC   Recent Labs Lab 10/08/16 0425  WBC 13.7*  HGB 12.5  HCT 35.9  PLT 232    Chemistries   Recent Labs Lab 10/07/16 1040  NA 138  K 3.8  CL 104  CO2 26  GLUCOSE 129*  BUN 7  CREATININE 0.74  CALCIUM 9.3  AST 24  ALT 21  ALKPHOS 68  BILITOT 0.7    Microbiology Results   Recent Results (from the past 240 hour(s))  C difficile quick scan w PCR reflex     Status: None   Collection Time: 10/09/16 11:40 AM  Result Value Ref Range Status   C Diff  antigen NEGATIVE NEGATIVE Final   C Diff toxin NEGATIVE NEGATIVE Final   C Diff interpretation No C. difficile detected.  Final    RADIOLOGY:  Ct Abdomen Pelvis W Contrast  Result Date: 10/07/2016 CLINICAL DATA:  71 year old female with generalized abdominal pain for the past 4 days. History of diverticulitis. Post gastric sleeve procedure and hysterectomy. Initial encounter. EXAM: CT ABDOMEN AND PELVIS WITH CONTRAST TECHNIQUE: Multidetector CT imaging of the abdomen and pelvis was performed using the standard protocol following bolus administration of intravenous contrast. CONTRAST:  ISOVUE-300 IOPAMIDOL (ISOVUE-300) INJECTION 61% COMPARISON:  None. FINDINGS: Lower chest: Minimal atelectasis lung bases. Heart size within normal limits. Prominent coronary artery calcification. Aortic valve calcification. Hepatobiliary: Fatty liver. 1 cm cyst left lobe liver. Post cholecystectomy. Pancreas: No mass, inflammation or pancreatic duct dilation. Spleen: No mass or enlargement. Adrenals/Urinary Tract: No renal or ureteral obstructing stone or evidence hydronephrosis. Left lower pole 1 cm cyst. No  adrenal lesion. Noncontrast filled views of the urinary bladder unremarkable. Stomach/Bowel: Inflammatory process at the junction of the descending colon and proximal sigmoid colon most suggestive of diverticulitis with infiltration of surrounding fat planes without drainable abscess. As there is minimally asymmetric wall thickening, after acute episode has cleared, evaluation of this aspect of colon will be necessary to exclude underlying mass. Post gastric sleeve procedure.  Small hiatal hernia. Vascular/Lymphatic: Atherosclerotic changes aorta and iliac arteries without aneurysmal dilation or large vessel occlusion. Scattered small lymph nodes possibly reactive in origin. Reproductive: Post hysterectomy. Left ovary adjacent to left bowel inflammatory process. Between the inferior margin of the right ovary and bladder is a 3 x 2.8 x 2 cm cystic collection which does not appear to arise from the bladder and although adjacent to the ovary does not appear to arise from the ovary. This may be related to prior hysterectomy and a small postoperative seroma. Other: No free intraperitoneal air. Musculoskeletal: Degenerative changes lower thoracic and lumbar spine. Sacroiliac joint degenerative changes. IMPRESSION: Inflammatory process at the junction of the descending colon and proximal sigmoid colon most suggestive of diverticulitis with infiltration of surrounding fat planes without drainable abscess. As there is minimally asymmetric wall thickening, after acute episode has cleared, evaluation of this aspect of colon will be necessary to exclude underlying mass. Post gastric sleeve procedure.  Small hiatal hernia. Between the inferior margin of the right ovary and bladder is a 3 x 2.8 x 2 cm cystic collection which does not appear to arise from the bladder and although adjacent to the ovary does not appear to arise from the ovary. This may be related to prior hysterectomy and a small postoperative seroma. Ovarian cyst  is a secondary consideration. Aortic atherosclerosis. Coronary artery calcifications. Fatty liver.  1 cm cyst left lobe liver.  Post cholecystectomy. These results were called by telephone at the time of interpretation on 10/07/2016 at 3:13 pm to Dr. Ileana Roup , who verbally acknowledged these results. Electronically Signed   By: Lacy Duverney M.D.   On: 10/07/2016 15:20     Management plans discussed with the patient, family and they are in agreement.  CODE STATUS:     Code Status Orders        Start     Ordered   10/08/16 0000  Full code  Continuous     10/07/16 2359    Code Status History    Date Active Date Inactive Code Status Order ID Comments User Context   This patient has a current code status  but no historical code status.    Advance Directive Documentation     Most Recent Value  Type of Advance Directive  Healthcare Power of Attorney, Living will  Pre-existing out of facility DNR order (yellow form or pink MOST form)  -  "MOST" Form in Place?  -      TOTAL TIME TAKING CARE OF THIS PATIENT: *40 minutes.    Lemarcus Baggerly M.D on 10/09/2016 at 2:18 PM  Between 7am to 6pm - Pager - [phone] After 6pm go to [email or web address] - Social research officer, government  [facility]  Office  [phone]  CC: Primary care physician; Nicki Reaper, NP

## 2016-10-09 NOTE — Progress Notes (Signed)
Patient understands all discharge instructions and the need to make follow up appointments. Patient discharge via wheelchair with auxillary. 

## 2016-10-28 ENCOUNTER — Ambulatory Visit (INDEPENDENT_AMBULATORY_CARE_PROVIDER_SITE_OTHER): Payer: Medicare Other | Admitting: Internal Medicine

## 2016-10-28 ENCOUNTER — Encounter (INDEPENDENT_AMBULATORY_CARE_PROVIDER_SITE_OTHER): Payer: Self-pay

## 2016-10-28 ENCOUNTER — Encounter: Payer: Self-pay | Admitting: Internal Medicine

## 2016-10-28 VITALS — BP 124/78 | HR 76 | Temp 98.3°F | Wt 249.0 lb

## 2016-10-28 DIAGNOSIS — R600 Localized edema: Secondary | ICD-10-CM | POA: Diagnosis not present

## 2016-10-28 DIAGNOSIS — K5732 Diverticulitis of large intestine without perforation or abscess without bleeding: Secondary | ICD-10-CM

## 2016-10-28 DIAGNOSIS — R635 Abnormal weight gain: Secondary | ICD-10-CM | POA: Diagnosis not present

## 2016-10-28 MED ORDER — MECLIZINE HCL 25 MG PO TABS
25.0000 mg | ORAL_TABLET | Freq: Three times a day (TID) | ORAL | 1 refills | Status: DC | PRN
Start: 2016-10-28 — End: 2017-01-24

## 2016-10-28 MED ORDER — HYDROCHLOROTHIAZIDE 25 MG PO TABS: 25.0000 mg | ORAL_TABLET | Freq: Every day | ORAL | 3 refills | Status: DC

## 2016-10-28 MED ORDER — ALBUTEROL SULFATE HFA 108 (90 BASE) MCG/ACT IN AERS: 2.0000 | INHALATION_SPRAY | Freq: Four times a day (QID) | RESPIRATORY_TRACT | 3 refills | Status: DC | PRN

## 2016-10-28 NOTE — Progress Notes (Signed)
Subjective:    Patient ID: Kim Harding, female    DOB: 21-Jul-1946, 71 y.o.   MRN: [id number]  HPI  Pt presents to the clinic today for ER followup and for 3 month followup.  She went to the ER 3/19 with c/o abdominal pain. CT scan of the abdomen, showed diverticulitis. She was placed on a clear liquid diet, initially treated with IV antibiotics and subsequently discharged on Augmentin. She was having some loose stools but C Diff was negative. She was discharged on 3/21. Her pain is much improved. She has been watching what she eats. She cut out nuts, cucumber and tomato's. She does not follow with GI.  She is also due for her weight check. She was started on Contrave 07/2016 but never picked it up because it required a prior auth. She has gained 10 lbs over the last 3 months. She denies any changes in her diet or exercise habits.   She also c/o bilateral foot swelling. She reports this started 4 days ago. It is not painful. She denies numbness and tingling in her feet. It is worse in the evening. She tries to elevate her feet when she can. She is taking HCTZ as prescribed.   She also need refills of her Albuterol and Meclinze today, [facility].  Review of Systems      Past Medical History:  Diagnosis Date  . Allergy   . Arthritis   . Chicken pox   . Diabetes mellitus without complication (HCC)   . Diverticulitis   . Hyperlipidemia   . Hypertension     Current Outpatient Prescriptions  Medication Sig Dispense Refill  . albuterol (PROVENTIL HFA;VENTOLIN HFA) 108 (90 BASE) MCG/ACT inhaler Inhale 2 puffs into the lungs every 6 (six) hours as needed for wheezing or shortness of breath. 6.7 Inhaler 3  . Cholecalciferol (VITAMIN D-3) 1000 UNITS CAPS Take 1 capsule by mouth daily.    Marland Kitchen esomeprazole (NEXIUM) 40 MG capsule TAKE 1 CAPSULE DAILY AT 12  NOON 90 capsule 1  . hydrochlorothiazide (HYDRODIURIL) 12.5 MG tablet Take 1 tablet (12.5 mg total) by mouth daily. 90 tablet 1  .  meclizine (ANTIVERT) 25 MG tablet Take 1 tablet (25 mg total) by mouth 3 (three) times daily as needed for dizziness. 30 tablet 0  . metFORMIN (GLUCOPHAGE) 500 MG tablet TAKE 1 TABLET TWICE A DAY WITH MEALS 180 tablet 1  . metoprolol succinate (TOPROL-XL) 100 MG 24 hr tablet Take 1 tablet (100 mg total) by mouth daily. Take with or immediately following a meal. 90 tablet 1  . potassium chloride SA (K-DUR,KLOR-CON) 20 MEQ tablet Take 1 tablet (20 mEq total) by mouth daily. 30 tablet 2  . rosuvastatin (CRESTOR) 10 MG tablet TAKE 1 TABLET DAILY 90 tablet 1   No current facility-administered medications for this visit.     Allergies  Allergen Reactions  . Aspirin Hives  . Darvon [Propoxyphene] Hives  . Latex Hives, Itching and Swelling  . Naproxen Hives and Itching    Family History  Problem Relation Age of Onset  . Heart disease Mother   . Arthritis Father   . Cancer Father     Prostate  . Diabetes Sister   . Diabetes Brother   . Breast cancer Neg Hx     Social History   Social History  . Marital status: Widowed    Spouse name: N/A  . Number of children: N/A  . Years of education: N/A   Occupational History  .  retired    Social History Main Topics  . Smoking status: Current Every Day Smoker    Packs/day: 0.20    Years: 50.00    Types: Cigarettes  . Smokeless tobacco: Never Used  . Alcohol use No  . Drug use: No  . Sexual activity: Not Currently   Other Topics Concern  . Not on file   Social History Narrative   Church activities, [employer],Movies     Constitutional: Pt reports weight gain. Denies fever, malaise, fatigue, headache.  Respiratory: Denies difficulty breathing, shortness of breath, cough or sputum production.   Cardiovascular: Pt reports swelling in her feet. Denies chest pain, chest tightness, palpitations or swelling in the hands.  Gastrointestinal: Denies abdominal pain, bloating, constipation, diarrhea or blood in the stool.    No other specific  complaints in a complete review of systems (except as listed in HPI above).  Objective:   Physical Exam   BP 124/78   Pulse 76   Temp 98.3 F (36.8 C) (Oral)   Wt 249 lb (112.9 kg)   SpO2 98%   BMI 40.19 kg/m  Wt Readings from Last 3 Encounters:  10/28/16 249 lb (112.9 kg)  10/07/16 236 lb (107 kg)  09/05/16 237 lb (107.5 kg)    General: Appears her stated age, obese in NAD. Cardiovascular: 1+ non pitting BLE edema noted.  Pulmonary/Chest: Normal effort and positive vesicular breath sounds. No respiratory distress. No wheezes, rales or ronchi noted.  Abdomen: Soft and nontender. Normal bowel sounds. No distention or masses noted.    BMET    Component Value Date/Time   NA 138 10/07/2016 1040   K 3.8 10/07/2016 1040   CL 104 10/07/2016 1040   CO2 26 10/07/2016 1040   GLUCOSE 129 (H) 10/07/2016 1040   BUN 7 10/07/2016 1040   CREATININE 0.74 10/07/2016 1040   CALCIUM 9.3 10/07/2016 1040   GFRNONAA >60 10/07/2016 1040   GFRAA >60 10/07/2016 1040    Lipid Panel     Component Value Date/Time   CHOL 132 12/13/2015 1503   TRIG 153.0 (H) 12/13/2015 1503   HDL 33.00 (L) 12/13/2015 1503   CHOLHDL 4 12/13/2015 1503   VLDL 30.6 12/13/2015 1503   LDLCALC 68 12/13/2015 1503    CBC    Component Value Date/Time   WBC 13.7 (H) 10/08/2016 0425   RBC 4.13 10/08/2016 0425   HGB 12.5 10/08/2016 0425   HCT 35.9 10/08/2016 0425   PLT 232 10/08/2016 0425   MCV 87.0 10/08/2016 0425   MCH 30.3 10/08/2016 0425   MCHC 34.8 10/08/2016 0425   RDW 14.5 10/08/2016 0425   LYMPHSABS 3.4 02/07/2015 1131   MONOABS 0.8 02/07/2015 1131   EOSABS 0.4 02/07/2015 1131   BASOSABS 0.0 02/07/2015 1131    Hgb A1C Lab Results  Component Value Date   HGBA1C 6.7 (H) 12/13/2015           Assessment & Plan:   Hospital Follow up for Diverticulitis:  Hospital notes, labs and imaging reviewed with the patient Symptoms have completely resolved Reinforced diet for  diverticulosis  Edema:  Increase HCTZ to 25 mg daily, eRx sent to pharmacy BMET at next appt  Abnormal Weight Gain:  Will do PA for Contrave Discussed the importance of a healthy diet and exercise regimen  RTC in 2 months for your Medicare Wellness Exam Nicki Reaper, NP

## 2016-10-28 NOTE — Patient Instructions (Signed)
Edema Edema is when you have too much fluid in your body or under your skin. Edema may make your legs, feet, and ankles swell up. Swelling is also common in looser tissues, like around your eyes. This is a common condition. It gets more common as you get older. There are many possible causes of edema. Eating too much salt (sodium) and being on your feet or sitting for a long time can cause edema in your legs, feet, and ankles. Hot weather may make edema worse. Edema is usually painless. Your skin may look swollen or shiny. Follow these instructions at home:  Keep the swollen body part raised (elevated) above the level of your heart when you are sitting or lying down.  Do not sit still or stand for a long time.  Do not wear tight clothes. Do not wear garters on your upper legs.  Exercise your legs. This can help the swelling go down.  Wear elastic bandages or support stockings as told by your doctor.  Eat a low-salt (low-sodium) diet to reduce fluid as told by your doctor.  Depending on the cause of your swelling, you may need to limit how much fluid you drink (fluid restriction).  Take over-the-counter and prescription medicines only as told by your doctor. Contact a doctor if:  Treatment is not working.  You have heart, liver, or kidney disease and have symptoms of edema.  You have sudden and unexplained weight gain. Get help right away if:  You have shortness of breath or chest pain.  You cannot breathe when you lie down.  You have pain, redness, or warmth in the swollen areas.  You have heart, liver, or kidney disease and get edema all of a sudden.  You have a fever and your symptoms get worse all of a sudden. Summary  Edema is when you have too much fluid in your body or under your skin.  Edema may make your legs, feet, and ankles swell up. Swelling is also common in looser tissues, like around your eyes.  Raise (elevate) the swollen body part above the level of your  heart when you are sitting or lying down.  Follow your doctor's instructions about diet and how much fluid you can drink (fluid restriction). This information is not intended to replace advice given to you by your health care provider. Make sure you discuss any questions you have with your health care provider. Document Released: 12/25/2007 Document Revised: 07/26/2016 Document Reviewed: 07/26/2016 Elsevier Interactive Patient Education  2017 Elsevier Inc.  

## 2016-10-30 ENCOUNTER — Telehealth: Payer: Self-pay

## 2016-10-30 MED ORDER — NALTREXONE-BUPROPION HCL ER 8-90 MG PO TB12: ORAL_TABLET | ORAL | 1 refills | Status: DC

## 2016-10-30 NOTE — Addendum Note (Signed)
Addended by: Roena Malady on: 10/30/2016 01:59 PM   Modules accepted: Orders

## 2016-10-30 NOTE — Telephone Encounter (Signed)
PA submitted through [email or web address]...Marland Kitchen Awaiting response

## 2016-10-30 NOTE — Telephone Encounter (Signed)
PA was denied--I called the appeals dept for further information---representative reopened PA and it has been approved through 05/01/2017 #[id number]  Pt is aware of approval

## 2016-10-30 NOTE — Telephone Encounter (Signed)
Pt was advised the PA was denied and wanted to know what to do next; advised pt Shawna Orleans CMA had already called appeals dept and got Naltrexone bupropion approved. Pt voiced understanding.

## 2016-10-30 NOTE — Telephone Encounter (Deleted)
I received fax stating that medication has been denied

## 2016-11-11 ENCOUNTER — Telehealth: Payer: Self-pay | Admitting: *Deleted

## 2016-11-11 ENCOUNTER — Other Ambulatory Visit: Payer: Self-pay | Admitting: Internal Medicine

## 2016-11-11 MED ORDER — NALTREXONE-BUPROPION HCL ER 8-90 MG PO TB12: ORAL_TABLET | ORAL | 1 refills | Status: DC

## 2016-11-11 NOTE — Addendum Note (Signed)
Addended by: Lorre Munroe on: 11/11/2016 04:12 PM   Modules accepted: Orders

## 2016-11-11 NOTE — Telephone Encounter (Signed)
Patient left a voicemail stating that she has gotten approval on Contrave.  Patient stated that she needs a script submitted to [facility] so that she can pick the medication up.

## 2016-11-11 NOTE — Telephone Encounter (Signed)
Rx faxed to [facility] 

## 2016-11-11 NOTE — Telephone Encounter (Signed)
RX printed and signed and placed in MYD box 

## 2016-12-11 ENCOUNTER — Encounter: Payer: Medicare Other | Admitting: Internal Medicine

## 2017-01-20 NOTE — Progress Notes (Signed)
[facility]* [facility]     Assessment and Plan:  COPD. --Has dyspnea with exertion. Asked to start to take 2 puffs of albuterol before exercise.   OSA on CPAP [place] in [place] 3 years ago, currently on CPAP wearing nightly. Per records, CPAP 12 cm of water --Start auto-CPAP with pressure range of 10-16.   Nicotine abuse.  --Discussed setting a quit date, start patches before.   Obesity.  --Discussed that weight loss would be helpful for her breathing.   Date: 01/20/2017  MRN# [id number] Kim Harding 07-Apr-1946   Kim Harding is a 71 y.o. old female seen in follow up for chief complaint of  Chief Complaint  Patient presents with  . Advice Only    prev VM pt: CPAP machine broke and needs new machine: doing well on CPAP prior to machine messing up.      HPI:   The patient is a 71 year old female, she saw all. Dr. Dema SeverinMungal in the office approximately 2 years ago. At that time it was noted that she had OSA and type A COPD, well controlled with when necessary rescue inhaler. She was also noted to be a smoker, and was advised on smoking cessation.  Her machine broke when she went on vacation; it could not be fixed due to the age of the machine. She notes that her sleepiness has been much better since being on the machine, she was using it around 8 hours per night every night. She much more awake, and was exercising more, now that she is off of it she is falling asleep spontaneously and very sleepy during the day.  She notices that she gets winded especially with stairs. [facility] and [facility] has been running out of breath. She has gained  40 lbs in 6 months.  She is smoking 3-4 cigs per day. She has quit in the past.  She has proventil inhaler, but she does not use it before exercise.   Sleep study 03/14/15; AHI of 21; 25 when supine.   Medication:    Current Outpatient Prescriptions:  .  albuterol (PROVENTIL HFA;VENTOLIN HFA) 108 (90  Base) MCG/ACT inhaler, Inhale 2 puffs into the lungs every 6 (six) hours as needed for wheezing or shortness of breath., Disp: 6.7 Inhaler, Rfl: 3 .  Cholecalciferol (VITAMIN D-3) 1000 UNITS CAPS, Take 1 capsule by mouth daily., Disp: , Rfl:  .  esomeprazole (NEXIUM) 40 MG capsule, TAKE 1 CAPSULE DAILY AT 12  NOON, Disp: 90 capsule, Rfl: 1 .  hydrochlorothiazide (HYDRODIURIL) 25 MG tablet, Take 1 tablet (25 mg total) by mouth daily., Disp: 90 tablet, Rfl: 3 .  meclizine (ANTIVERT) 25 MG tablet, Take 1 tablet (25 mg total) by mouth 3 (three) times daily as needed for dizziness., Disp: 90 tablet, Rfl: 1 .  metFORMIN (GLUCOPHAGE) 500 MG tablet, TAKE 1 TABLET TWICE A DAY WITH MEALS, Disp: 180 tablet, Rfl: 1 .  metoprolol succinate (TOPROL-XL) 100 MG 24 hr tablet, Take 1 tablet (100 mg total) by mouth daily. Take with or immediately following a meal., Disp: 90 tablet, Rfl: 1 .  Naltrexone-Bupropion HCl ER 8-90 MG TB12, Take 1 tab daily x 1 week, then increase to 1 tab BID x 1 week, then increase to 2 tabs in am, 1 in pm x 1 week, then take 2 tabs BID thereafter, Disp: 120 tablet, Rfl: 1 .  potassium chloride SA (K-DUR,KLOR-CON) 20 MEQ tablet, TAKE 1 TABLET DAILY, Disp: 90  tablet, Rfl: 1 .  rosuvastatin (CRESTOR) 10 MG tablet, TAKE 1 TABLET DAILY, Disp: 90 tablet, Rfl: 1   Allergies:  Aspirin; Darvon [propoxyphene]; Latex; and Naproxen  Review of Systems: Gen:  Denies  fever, sweats. HEENT: Denies blurred vision. Cvc:  No dizziness, chest pain or heaviness Resp:   Denies cough or sputum porduction. Gi: Denies swallowing difficulty, stomach pain. constipation, bowel incontinence Gu:  Denies bladder incontinence, burning urine Ext:   No Joint pain, stiffness. Skin: No skin rash, easy bruising. Endoc:  No polyuria, polydipsia. Psych: No depression, insomnia. Other:  All other systems were reviewed and found to be negative other than what is mentioned in the HPI.   Physical Examination:   VS: BP  114/70 (BP Location: Left Arm, Cuff Size: Normal)   Pulse 79   Ht 5\' 6"  (1.676 m)   Wt 242 lb (109.8 kg)   SpO2 97%   BMI 39.06 kg/m   General Appearance: No distress  Neuro:without focal findings,  speech normal,  HEENT: PERRLA, EOM intact. Pulmonary: normal breath sounds, No wheezing.   CardiovascularNormal S1,S2.  No m/r/g.   Abdomen: Benign, Soft, non-tender. Renal:  No costovertebral tenderness  GU:  Not performed at this time. Endoc: No evident thyromegaly, no signs of acromegaly. Skin:   warm, no rash. Extremities: normal, no cyanosis, clubbing.   LABORATORY PANEL:   CBC No results for input(s): WBC, HGB, HCT, PLT in the last 168 hours. ------------------------------------------------------------------------------------------------------------------  Chemistries  No results for input(s): NA, K, CL, CO2, GLUCOSE, BUN, CREATININE, CALCIUM, MG, AST, ALT, ALKPHOS, BILITOT in the last 168 hours.  Invalid input(s): GFRCGP ------------------------------------------------------------------------------------------------------------------  Cardiac Enzymes No results for input(s): TROPONINI in the last 168 hours. ------------------------------------------------------------  RADIOLOGY:    Results for orders placed during the hospital encounter of 09/12/14  DG Chest 2 View   Narrative CLINICAL DATA:  Dyspnea on exertion, history of smoking  EXAM: CHEST  2 VIEW  COMPARISON:  None.  FINDINGS: Cardiac shadow is within normal limits. The lungs are well aerated bilaterally. Mild interstitial changes are seen without focal infiltrate. No effusion or pneumothorax is noted.  IMPRESSION: No active cardiopulmonary disease.   Electronically Signed   By: Alcide Clever M.D.   On: 09/12/2014 12:39    ------------------------------------------------------------------------------------------------------------------  Thank  you for allowing [facility] to assist in the care of your patient. Our recommendations are noted above.  Please contact us if we can be of further service.   Wells Guiles, MD.  [facility] Office Number: [phone]  Santiago Glad, M.D.  Billy Fischer, M.D  01/20/2017

## 2017-01-21 ENCOUNTER — Ambulatory Visit (INDEPENDENT_AMBULATORY_CARE_PROVIDER_SITE_OTHER): Payer: Medicare Other | Admitting: Internal Medicine

## 2017-01-21 ENCOUNTER — Encounter: Payer: Self-pay | Admitting: Internal Medicine

## 2017-01-21 VITALS — BP 114/70 | HR 79 | Ht 66.0 in | Wt 242.0 lb

## 2017-01-21 DIAGNOSIS — Z720 Tobacco use: Secondary | ICD-10-CM | POA: Diagnosis not present

## 2017-01-21 DIAGNOSIS — J438 Other emphysema: Secondary | ICD-10-CM | POA: Diagnosis not present

## 2017-01-21 DIAGNOSIS — G4733 Obstructive sleep apnea (adult) (pediatric): Secondary | ICD-10-CM

## 2017-01-21 NOTE — Patient Instructions (Addendum)
--  Take 2 puffs of proventil inhaler before exercise.   --Start auto-CPAP with pressure range of 10-16.   --Quitting smoking is the most important thing that you can do for your health.  --Quitting smoking will have greater affect on your health than any medicine that we can give you.

## 2017-01-24 ENCOUNTER — Other Ambulatory Visit: Payer: Self-pay

## 2017-01-24 MED ORDER — MECLIZINE HCL 25 MG PO TABS: 25.0000 mg | ORAL_TABLET | Freq: Three times a day (TID) | ORAL | 0 refills | Status: DC | PRN

## 2017-01-24 NOTE — Telephone Encounter (Signed)
Rx sent electronically.  

## 2017-01-27 ENCOUNTER — Telehealth: Payer: Self-pay

## 2017-01-27 NOTE — Telephone Encounter (Signed)
PA dept of [facility] left v/m can cb for PA of meclizine ref [id number]. [facility] will fax PA request today.Please advise.

## 2017-01-29 ENCOUNTER — Encounter: Payer: Medicare Other | Admitting: Internal Medicine

## 2017-01-29 NOTE — Telephone Encounter (Signed)
She can just get it over the counter.

## 2017-01-29 NOTE — Telephone Encounter (Signed)
Pt is aware as instructed 

## 2017-01-29 NOTE — Telephone Encounter (Signed)
Medication has been denied via fax received today... Please advise

## 2017-02-04 ENCOUNTER — Encounter: Payer: Self-pay | Admitting: Internal Medicine

## 2017-02-04 ENCOUNTER — Ambulatory Visit (INDEPENDENT_AMBULATORY_CARE_PROVIDER_SITE_OTHER): Payer: Medicare Other | Admitting: Internal Medicine

## 2017-02-04 VITALS — BP 116/68 | HR 65 | Temp 98.1°F | Ht 65.0 in | Wt 241.5 lb

## 2017-02-04 DIAGNOSIS — Z0000 Encounter for general adult medical examination without abnormal findings: Secondary | ICD-10-CM | POA: Diagnosis not present

## 2017-02-04 DIAGNOSIS — Z9989 Dependence on other enabling machines and devices: Secondary | ICD-10-CM | POA: Diagnosis not present

## 2017-02-04 DIAGNOSIS — E7800 Pure hypercholesterolemia, unspecified: Secondary | ICD-10-CM

## 2017-02-04 DIAGNOSIS — M1990 Unspecified osteoarthritis, unspecified site: Secondary | ICD-10-CM | POA: Diagnosis not present

## 2017-02-04 DIAGNOSIS — K219 Gastro-esophageal reflux disease without esophagitis: Secondary | ICD-10-CM

## 2017-02-04 DIAGNOSIS — I10 Essential (primary) hypertension: Secondary | ICD-10-CM

## 2017-02-04 DIAGNOSIS — G4733 Obstructive sleep apnea (adult) (pediatric): Secondary | ICD-10-CM

## 2017-02-04 DIAGNOSIS — E119 Type 2 diabetes mellitus without complications: Secondary | ICD-10-CM

## 2017-02-04 LAB — LIPID PANEL
CHOLESTEROL: 133 mg/dL (ref 0–200)
HDL: 41.1 mg/dL (ref >39.00)
LDL CALC: 72 mg/dL (ref 0–99)
NONHDL: 91.49
Total CHOL/HDL Ratio: 3
Triglycerides: 99 mg/dL (ref 0.0–149.0)
VLDL: 19.8 mg/dL (ref 0.0–40.0)

## 2017-02-04 LAB — COMPREHENSIVE METABOLIC PANEL
ALBUMIN: 3.8 g/dL (ref 3.5–5.2)
ALK PHOS: 64 U/L (ref 39–117)
ALT: 14 U/L (ref 0–35)
AST: 18 U/L (ref 0–37)
BUN: 11 mg/dL (ref 6–23)
CHLORIDE: 104 meq/L (ref 96–112)
CO2: 31 mEq/L (ref 19–32)
CREATININE: 0.84 mg/dL (ref 0.40–1.20)
Calcium: 9.7 mg/dL (ref 8.4–10.5)
GFR: 86.43 mL/min (ref >60.00)
GLUCOSE: 102 mg/dL — AB (ref 70–99)
Potassium: 3.7 mEq/L (ref 3.5–5.1)
Sodium: 141 mEq/L (ref 135–145)
TOTAL PROTEIN: 7 g/dL (ref 6.0–8.3)
Total Bilirubin: 0.6 mg/dL (ref 0.2–1.2)

## 2017-02-04 LAB — CBC
HCT: 42.4 % (ref 36.0–46.0)
Hemoglobin: 14.3 g/dL (ref 12.0–15.0)
MCHC: 33.8 g/dL (ref 30.0–36.0)
MCV: 87.7 fl (ref 78.0–100.0)
Platelets: 272 10*3/uL (ref 150.0–400.0)
RBC: 4.84 Mil/uL (ref 3.87–5.11)
RDW: 14.3 % (ref 11.5–15.5)
WBC: 9.1 10*3/uL (ref 4.0–10.5)

## 2017-02-04 LAB — VITAMIN D 25 HYDROXY (VIT D DEFICIENCY, FRACTURES): VITD: 35.51 ng/mL (ref 30.00–100.00)

## 2017-02-04 LAB — HEMOGLOBIN A1C: Hgb A1c MFr Bld: 6.4 % (ref 4.6–6.5)

## 2017-02-04 NOTE — Assessment & Plan Note (Signed)
A1C today Will get yearly microablumins Encouraged her to consume a low carb diet and exercise for weight loss Continue Mefformin Foot exam today Advised her to continue yearly eye exams

## 2017-02-04 NOTE — Assessment & Plan Note (Signed)
CMET and Lipid Profile today Encouraged her to consume a low fat diet. Continue Crestor

## 2017-02-04 NOTE — Assessment & Plan Note (Signed)
She is waiting on her CPAP machine She will continue to follow with Dr. Nicholos Johnsamachandran

## 2017-02-04 NOTE — Assessment & Plan Note (Signed)
Discussed how weight loss could help reduce knee pain Continue Advil as needed

## 2017-02-04 NOTE — Patient Instructions (Signed)
Health Maintenance for Postmenopausal Women Menopause is a normal process in which your reproductive ability comes to an end. This process happens gradually over a span of months to years, usually between the ages of 22 and 9. Menopause is complete when you have missed 12 consecutive menstrual periods. It is important to talk with your health care provider about some of the most common conditions that affect postmenopausal women, such as heart disease, cancer, and bone loss (osteoporosis). Adopting a healthy lifestyle and getting preventive care can help to promote your health and wellness. Those actions can also lower your chances of developing some of these common conditions. What should I know about menopause? During menopause, you may experience a number of symptoms, such as:  Moderate-to-severe hot flashes.  Night sweats.  Decrease in sex drive.  Mood swings.  Headaches.  Tiredness.  Irritability.  Memory problems.  Insomnia.  Choosing to treat or not to treat menopausal changes is an individual decision that you make with your health care provider. What should I know about hormone replacement therapy and supplements? Hormone therapy products are effective for treating symptoms that are associated with menopause, such as hot flashes and night sweats. Hormone replacement carries certain risks, especially as you become older. If you are thinking about using estrogen or estrogen with progestin treatments, discuss the benefits and risks with your health care provider. What should I know about heart disease and stroke? Heart disease, heart attack, and stroke become more likely as you age. This may be due, in part, to the hormonal changes that your body experiences during menopause. These can affect how your body processes dietary fats, triglycerides, and cholesterol. Heart attack and stroke are both medical emergencies. There are many things that you can do to help prevent heart disease  and stroke:  Have your blood pressure checked at least every 1-2 years. High blood pressure causes heart disease and increases the risk of stroke.  If you are 53-22 years old, ask your health care provider if you should take aspirin to prevent a heart attack or a stroke.  Do not use any tobacco products, including cigarettes, chewing tobacco, or electronic cigarettes. If you need help quitting, ask your health care provider.  It is important to eat a healthy diet and maintain a healthy weight. ? Be sure to include plenty of vegetables, fruits, low-fat dairy products, and lean protein. ? Avoid eating foods that are high in solid fats, added sugars, or salt (sodium).  Get regular exercise. This is one of the most important things that you can do for your health. ? Try to exercise for at least 150 minutes each week. The type of exercise that you do should increase your heart rate and make you sweat. This is known as moderate-intensity exercise. ? Try to do strengthening exercises at least twice each week. Do these in addition to the moderate-intensity exercise.  Know your numbers.Ask your health care provider to check your cholesterol and your blood glucose. Continue to have your blood tested as directed by your health care provider.  What should I know about cancer screening? There are several types of cancer. Take the following steps to reduce your risk and to catch any cancer development as early as possible. Breast Cancer  Practice breast self-awareness. ? This means understanding how your breasts normally appear and feel. ? It also means doing regular breast self-exams. Let your health care provider know about any changes, no matter how small.  If you are 40  or older, have a clinician do a breast exam (clinical breast exam or CBE) every year. Depending on your age, family history, and medical history, it may be recommended that you also have a yearly breast X-ray (mammogram).  If you  have a family history of breast cancer, talk with your health care provider about genetic screening.  If you are at high risk for breast cancer, talk with your health care provider about having an MRI and a mammogram every year.  Breast cancer (BRCA) gene test is recommended for women who have family members with BRCA-related cancers. Results of the assessment will determine the need for genetic counseling and BRCA1 and for BRCA2 testing. BRCA-related cancers include these types: ? Breast. This occurs in males or females. ? Ovarian. ? Tubal. This may also be called fallopian tube cancer. ? Cancer of the abdominal or pelvic lining (peritoneal cancer). ? Prostate. ? Pancreatic.  Cervical, Uterine, and Ovarian Cancer Your health care provider may recommend that you be screened regularly for cancer of the pelvic organs. These include your ovaries, uterus, and vagina. This screening involves a pelvic exam, which includes checking for microscopic changes to the surface of your cervix (Pap test).  For women ages 21-65, health care providers may recommend a pelvic exam and a Pap test every three years. For women ages 79-65, they may recommend the Pap test and pelvic exam, combined with testing for human papilloma virus (HPV), every five years. Some types of HPV increase your risk of cervical cancer. Testing for HPV may also be done on women of any age who have unclear Pap test results.  Other health care providers may not recommend any screening for nonpregnant women who are considered low risk for pelvic cancer and have no symptoms. Ask your health care provider if a screening pelvic exam is right for you.  If you have had past treatment for cervical cancer or a condition that could lead to cancer, you need Pap tests and screening for cancer for at least 20 years after your treatment. If Pap tests have been discontinued for you, your risk factors (such as having a new sexual partner) need to be  reassessed to determine if you should start having screenings again. Some women have medical problems that increase the chance of getting cervical cancer. In these cases, your health care provider may recommend that you have screening and Pap tests more often.  If you have a family history of uterine cancer or ovarian cancer, talk with your health care provider about genetic screening.  If you have vaginal bleeding after reaching menopause, tell your health care provider.  There are currently no reliable tests available to screen for ovarian cancer.  Lung Cancer Lung cancer screening is recommended for adults 69-62 years old who are at high risk for lung cancer because of a history of smoking. A yearly low-dose CT scan of the lungs is recommended if you:  Currently smoke.  Have a history of at least 30 pack-years of smoking and you currently smoke or have quit within the past 15 years. A pack-year is smoking an average of one pack of cigarettes per day for one year.  Yearly screening should:  Continue until it has been 15 years since you quit.  Stop if you develop a health problem that would prevent you from having lung cancer treatment.  Colorectal Cancer  This type of cancer can be detected and can often be prevented.  Routine colorectal cancer screening usually begins at  age 42 and continues through age 45.  If you have risk factors for colon cancer, your health care provider may recommend that you be screened at an earlier age.  If you have a family history of colorectal cancer, talk with your health care provider about genetic screening.  Your health care provider may also recommend using home test kits to check for hidden blood in your stool.  A small camera at the end of a tube can be used to examine your colon directly (sigmoidoscopy or colonoscopy). This is done to check for the earliest forms of colorectal cancer.  Direct examination of the colon should be repeated every  5-10 years until age 71. However, if early forms of precancerous polyps or small growths are found or if you have a family history or genetic risk for colorectal cancer, you may need to be screened more often.  Skin Cancer  Check your skin from head to toe regularly.  Monitor any moles. Be sure to tell your health care provider: ? About any new moles or changes in moles, especially if there is a change in a mole's shape or color. ? If you have a mole that is larger than the size of a pencil eraser.  If any of your family members has a history of skin cancer, especially at a young age, talk with your health care provider about genetic screening.  Always use sunscreen. Apply sunscreen liberally and repeatedly throughout the day.  Whenever you are outside, protect yourself by wearing long sleeves, pants, a wide-brimmed hat, and sunglasses.  What should I know about osteoporosis? Osteoporosis is a condition in which bone destruction happens more quickly than new bone creation. After menopause, you may be at an increased risk for osteoporosis. To help prevent osteoporosis or the bone fractures that can happen because of osteoporosis, the following is recommended:  If you are 46-71 years old, get at least 1,000 mg of calcium and at least 600 mg of vitamin D per day.  If you are older than age 55 but younger than age 65, get at least 1,200 mg of calcium and at least 600 mg of vitamin D per day.  If you are older than age 54, get at least 1,200 mg of calcium and at least 800 mg of vitamin D per day.  Smoking and excessive alcohol intake increase the risk of osteoporosis. Eat foods that are rich in calcium and vitamin D, and do weight-bearing exercises several times each week as directed by your health care provider. What should I know about how menopause affects my mental health? Depression may occur at any age, but it is more common as you become older. Common symptoms of depression  include:  Low or sad mood.  Changes in sleep patterns.  Changes in appetite or eating patterns.  Feeling an overall lack of motivation or enjoyment of activities that you previously enjoyed.  Frequent crying spells.  Talk with your health care provider if you think that you are experiencing depression. What should I know about immunizations? It is important that you get and maintain your immunizations. These include:  Tetanus, diphtheria, and pertussis (Tdap) booster vaccine.  Influenza every year before the flu season begins.  Pneumonia vaccine.  Shingles vaccine.  Your health care provider may also recommend other immunizations. This information is not intended to replace advice given to you by your health care provider. Make sure you discuss any questions you have with your health care provider. Document Released: 08/30/2005  Document Revised: 01/26/2016 Document Reviewed: 04/11/2015 Elsevier Interactive Patient Education  2018 [employer]  

## 2017-02-04 NOTE — Assessment & Plan Note (Signed)
Continue HCTZ, Potassium and Metoprolol CMET today

## 2017-02-04 NOTE — Progress Notes (Signed)
HPI:  Pt presents to the clinic today for her Medicare Wellness Exam. She is also due to follow up chronic conditions.  Seasonal Allergies: Worse in the spring. She takes and OTC antihistamine as needed with good relief.   Arthritis: Mainly in her knees. She takes Advil as needed with good relief.  DM 2: Her last A1C was 6.7%, 11/2015. She is taking Metformin as prescribed. Her sugars are usually in the 120-130's. She checks her feet daily.   HLD: Her last LDL was 68, 11/2015. She is taking Crestor as prescribed. She denies myalgias. She tries to consume a low fat diet.  HTN: Her BP today is 116/68. She is taking HCTZ, Potassium and Metoprolol as prescribed. She does c/o swelling in her feet. ECG from  09/2015 reviewed.  OSA: She follows with Dr. Nicholos Johns. She does not have a replacement CPAP yet. She gets about 6 hours of sleep at night. She does not feel rested during the day.  GERD: She denies breakthrough symptoms on Nexium.  Past Medical History:  Diagnosis Date  . Allergy   . Arthritis   . Chicken pox   . Diabetes mellitus without complication (HCC)   . Diverticulitis   . Hyperlipidemia   . Hypertension     Current Outpatient Prescriptions  Medication Sig Dispense Refill  . albuterol (PROVENTIL HFA;VENTOLIN HFA) 108 (90 Base) MCG/ACT inhaler Inhale 2 puffs into the lungs every 6 (six) hours as needed for wheezing or shortness of breath. 6.7 Inhaler 3  . Cholecalciferol (VITAMIN D-3) 1000 UNITS CAPS Take 1 capsule by mouth daily.    Marland Kitchen esomeprazole (NEXIUM) 40 MG capsule TAKE 1 CAPSULE DAILY AT 12  NOON 90 capsule 1  . hydrochlorothiazide (HYDRODIURIL) 25 MG tablet Take 1 tablet (25 mg total) by mouth daily. 90 tablet 3  . meclizine (ANTIVERT) 25 MG tablet Take 1 tablet (25 mg total) by mouth 3 (three) times daily as needed for dizziness. 90 tablet 0  . metFORMIN (GLUCOPHAGE) 500 MG tablet TAKE 1 TABLET TWICE A DAY WITH MEALS 180 tablet 1  . metoprolol succinate (TOPROL-XL)  100 MG 24 hr tablet Take 1 tablet (100 mg total) by mouth daily. Take with or immediately following a meal. 90 tablet 1  . Naltrexone-Bupropion HCl ER 8-90 MG TB12 Take 1 tab daily x 1 week, then increase to 1 tab BID x 1 week, then increase to 2 tabs in am, 1 in pm x 1 week, then take 2 tabs BID thereafter 120 tablet 1  . potassium chloride SA (K-DUR,KLOR-CON) 20 MEQ tablet TAKE 1 TABLET DAILY 90 tablet 1  . rosuvastatin (CRESTOR) 10 MG tablet TAKE 1 TABLET DAILY 90 tablet 1   No current facility-administered medications for this visit.     Allergies  Allergen Reactions  . Aspirin Hives  . Darvon [Propoxyphene] Hives  . Latex Hives, Itching and Swelling  . Naproxen Hives and Itching    Family History  Problem Relation Age of Onset  . Heart disease Mother   . Arthritis Father   . Cancer Father        Prostate  . Diabetes Sister   . Diabetes Brother   . Breast cancer Neg Hx     Social History   Social History  . Marital status: Widowed    Spouse name: N/A  . Number of children: N/A  . Years of education: N/A   Occupational History  . retired    Social History Main Topics  . Smoking  status: Current Every Day Smoker    Packs/day: 0.25    Years: 50.00    Types: Cigarettes  . Smokeless tobacco: Never Used  . Alcohol use No  . Drug use: No  . Sexual activity: Not Currently   Other Topics Concern  . Not on file   Social History Narrative   Church activities, [place]    Hospitiliaztions: 09/2016, diverticulitis  Health Maintenance:    Flu: never  Tetanus: 12/2009  Pneumovax: 03/2014  Prevnar: 03/2015  Zostavax: 12/2010  Shingrix: never  Mammogram: 03/2016  Pap Smear: Hysterectomy  Bone Density: 03/2003  Colon Screening: 2012  Eye Doctor: annually, 01/30/2016  Dental Exam: as needed   Providers:   PCP: Nicki Reaper, NP-C  Pulmonologist: Dr. Nicholos Johns   I have personally reviewed and have noted:  1. The patient's medical and social  history 2. Their use of alcohol, tobacco or illicit drugs 3. Their current medications and supplements 4. The patient's functional ability including ADL's, fall risks, home safety risks and hearing or visual impairment. 5. Diet and physical activities 6. Evidence for depression or mood disorder  Subjective:   Review of Systems:   Constitutional: Denies fever, malaise, fatigue, headache or abrupt weight changes.  HEENT: Denies eye pain, eye redness, ear pain, ringing in the ears, wax buildup, runny nose, nasal congestion, bloody nose, or sore throat. Respiratory: Denies difficulty breathing, shortness of breath, cough or sputum production.   Cardiovascular: Pt reports swelling in her feet. Denies chest pain, chest tightness, palpitations or swelling in the hands.  Gastrointestinal: Denies abdominal pain, bloating, constipation, diarrhea or blood in the stool.  GU: Denies urgency, frequency, pain with urination, burning sensation, blood in urine, odor or discharge. Musculoskeletal: Denies decrease in range of motion, difficulty with gait, muscle pain or joint pain and swelling.  Skin: Denies redness, rashes, lesions or ulcercations.  Neurological: Denies dizziness, difficulty with memory, difficulty with speech or problems with balance and coordination.  Psych: Denies anxiety, depression, SI/HI.  No other specific complaints in a complete review of systems (except as listed in HPI above).  Objective:  PE:   BP 116/68   Pulse 65   Temp 98.1 F (36.7 C) (Oral)   Ht 5\' 5"  (1.651 m)   Wt 241 lb 8 oz (109.5 kg)   SpO2 98%   BMI 40.19 kg/m   Wt Readings from Last 3 Encounters:  01/21/17 242 lb (109.8 kg)  10/28/16 249 lb (112.9 kg)  10/07/16 236 lb (107 kg)    General: Appears her stated age, obese in NAD. Skin: Warm, dry and intact. No ulcerations noted. HEENT: Head: normal shape and size; Eyes: sclera white, no icterus, conjunctiva pink, PERRLA and EOMs intact; Ears: Tm's gray  and intact, normal light reflex; Throat/Mouth: Teeth present, mucosa pink and moist, no exudate, lesions or ulcerations noted.  Neck: Neck supple, trachea midline. No masses, lumps or thyromegaly present.  Cardiovascular: Normal rate and rhythm. S1,S2 noted.  No murmur, rubs or gallops noted. 1+ BLE edema. No carotid bruits noted. Pulmonary/Chest: Normal effort and positive vesicular breath sounds. No respiratory distress. No wheezes, rales or ronchi noted.  Abdomen: Soft and nontender. Normal bowel sounds. No distention or masses noted.  Musculoskeletal: Strength 5/5 BUE/BLE.  Neurological: Alert and oriented. Cranial nerves II-XII grossly intact. Coordination normal.  Psychiatric: Mood and affect normal. Behavior is normal. Judgment and thought content normal.    BMET    Component Value Date/Time   NA 138 10/07/2016 1040  K 3.8 10/07/2016 1040   CL 104 10/07/2016 1040   CO2 26 10/07/2016 1040   GLUCOSE 129 (H) 10/07/2016 1040   BUN 7 10/07/2016 1040   CREATININE 0.74 10/07/2016 1040   CALCIUM 9.3 10/07/2016 1040   GFRNONAA >60 10/07/2016 1040   GFRAA >60 10/07/2016 1040    Lipid Panel     Component Value Date/Time   CHOL 132 12/13/2015 1503   TRIG 153.0 (H) 12/13/2015 1503   HDL 33.00 (L) 12/13/2015 1503   CHOLHDL 4 12/13/2015 1503   VLDL 30.6 12/13/2015 1503   LDLCALC 68 12/13/2015 1503    CBC    Component Value Date/Time   WBC 13.7 (H) 10/08/2016 0425   RBC 4.13 10/08/2016 0425   HGB 12.5 10/08/2016 0425   HCT 35.9 10/08/2016 0425   PLT 232 10/08/2016 0425   MCV 87.0 10/08/2016 0425   MCH 30.3 10/08/2016 0425   MCHC 34.8 10/08/2016 0425   RDW 14.5 10/08/2016 0425   LYMPHSABS 3.4 02/07/2015 1131   MONOABS 0.8 02/07/2015 1131   EOSABS 0.4 02/07/2015 1131   BASOSABS 0.0 02/07/2015 1131    Hgb A1C Lab Results  Component Value Date   HGBA1C 6.7 (H) 12/13/2015      Assessment and Plan:   Medicare Annual Wellness Visit:  Diet: She does eat lean meat.  She consumes fruits and veggies daily. She tries to avoid fried foods. She drinks mostly Dt. Pepsi. Physical activity: She [facility] to the [employer], walks 3 miles, 3 days a weel Depression/mood screen: Negative Hearing: Intact to whispered voice Visual acuity: Grossly normal, performs annual eye exam  ADLs: Capable Fall risk: None Home safety: Good Cognitive evaluation: Intact to orientation, naming, recall and repetition EOL planning: Adv directives, full code/ I agree  Preventative Medicine: Encouraged her to get a flu shot in the fall. Tetanus, Pneumovax, Prevnar and Zostovax UTD. Will advise her to get Shingrix next year. She will make an appt for her mammogram. She declines pelvic exam or bone density screening. Her colon screening is UTD. Encouraged her to consume a balanced diet and exercise regimen. Advised her to see an eye doctor and dentist annually. Will check CBC, CMET, Lipid, A1C and Vit D today.   Next appointment: 1 year, sooner if needed   Nicki ReaperBAITY, Ramesses Crampton, NP

## 2017-02-04 NOTE — Assessment & Plan Note (Signed)
Discussed how weight loss could help improve her reflux Continue Nexium CMET today

## 2017-02-05 NOTE — Progress Notes (Signed)
Pre visit review using our clinic review tool, if applicable. No additional management support is needed unless otherwise documented below in the visit note. 

## 2017-02-28 ENCOUNTER — Other Ambulatory Visit: Payer: Self-pay | Admitting: Internal Medicine

## 2017-02-28 DIAGNOSIS — Z1231 Encounter for screening mammogram for malignant neoplasm of breast: Secondary | ICD-10-CM

## 2017-03-31 ENCOUNTER — Encounter: Payer: Self-pay | Admitting: Internal Medicine

## 2017-03-31 ENCOUNTER — Other Ambulatory Visit: Payer: Self-pay | Admitting: Internal Medicine

## 2017-03-31 ENCOUNTER — Other Ambulatory Visit: Payer: Self-pay | Admitting: Family Medicine

## 2017-03-31 LAB — HM DIABETES EYE EXAM

## 2017-04-01 ENCOUNTER — Telehealth: Payer: Self-pay

## 2017-04-01 NOTE — Telephone Encounter (Signed)
[facility] left v/m requesting cb about prior auth for meclizine; ref # [id number]. If not responded by 04/03/2017 this case will be denied.

## 2017-04-01 NOTE — Telephone Encounter (Signed)
It was already denied back in January via [email or web address] and we told pt to get over the counter per telephone note from 01/27/2017

## 2017-04-08 ENCOUNTER — Ambulatory Visit
Admission: RE | Admit: 2017-04-08 | Discharge: 2017-04-08 | Disposition: A | Discharge status: Home / Self Care | Payer: Medicare Other | Source: Ambulatory Visit | Attending: Internal Medicine | Admitting: Internal Medicine

## 2017-04-08 DIAGNOSIS — Z1231 Encounter for screening mammogram for malignant neoplasm of breast: Secondary | ICD-10-CM | POA: Diagnosis present

## 2017-04-18 MED ORDER — POTASSIUM CHLORIDE CRYS ER 20 MEQ PO TBCR: 20.0000 meq | EXTENDED_RELEASE_TABLET | Freq: Every day | ORAL | 1 refills | Status: DC

## 2017-04-20 ENCOUNTER — Encounter: Payer: Self-pay | Admitting: Internal Medicine

## 2017-04-21 NOTE — Progress Notes (Signed)
[facility]     Assessment and Plan:  COPD. --Stable. Asked to start to take 2 puffs of albuterol before exercise.   OSA on CPAP -Doing well with CPAP. --Continue auto-CPAP with pressure range of 10-16.   Nicotine abuse.  --Discussed setting a quit date, spent greater than 3 minutes in discussion. -Prescribed Chantix starter pack.  Obesity.  --weight loss would be helpful for her breathing.   Meds ordered this encounter  Medications  . varenicline (CHANTIX STARTING MONTH PAK) 0.5 MG X 11 & 1 MG X 42 tablet    Sig: Take one 0.5 mg tablet by mouth once daily for 3 days, then increase to one 0.5 mg tablet twice daily for 4 days, then increase to one 1 mg tablet twice daily.    Dispense:  53 tablet    Refill:  0   Return in about 1 year (around 04/22/2018).   Date: 04/21/2017  MRN# [id number] Kim Harding [birth year]   Kim Harding is a 71 y.o. old female seen in follow up for chief complaint of  Chief Complaint  Patient presents with  . Sleep Apnea    Pt here for cpap f/u: She has new machine and love it. She wears nightly at least 7-8 hrs.     HPI:   The patient is a 71 year old female she has OSA and type A COPD, well controlled with when necessary rescue inhaler. At last visit it was noted that the patient had dyspnea on exertion, she was asked to use albuterol before exercise, it was also noted that she had gained a significant amount of weight in the previous 6 months, and she was advised weight loss as well as smoking cessation.  She is sleeping well with her new machine, she is sleeping the entire night, about 8 hours, she is feeling more refreshed during the day and no longer snoring.  She has continued to smoke, she has not used the patches as she sweats and they come off.   Sleep study 03/14/15; AHI of 21; 25 when supine.   **Download data on/1-9/30/18. Average usage on days used was 5/33 minutes, uses greater than 4 hours a 63%, set  10-16. 95th percentile pressure is 14.4, maximum pressure is 15. Wean pressors 11.7. Residual AHI is 1.4.  Medication:    Current Outpatient Prescriptions:  .  albuterol (PROVENTIL HFA;VENTOLIN HFA) 108 (90 Base) MCG/ACT inhaler, Inhale 2 puffs into the lungs every 6 (six) hours as needed for wheezing or shortness of breath., Disp: 6.7 Inhaler, Rfl: 3 .  Cholecalciferol (VITAMIN D-3) 1000 UNITS CAPS, Take 1 capsule by mouth daily., Disp: , Rfl:  .  esomeprazole (NEXIUM) 40 MG capsule, TAKE 1 CAPSULE BY MOUTH  DAILY AT 12 NOON, Disp: 90 capsule, Rfl: 1 .  hydrochlorothiazide (HYDRODIURIL) 25 MG tablet, Take 1 tablet (25 mg total) by mouth daily., Disp: 90 tablet, Rfl: 3 .  meclizine (ANTIVERT) 25 MG tablet, Take 1 tablet (25 mg total) by mouth 3 (three) times daily as needed for dizziness., Disp: 90 tablet, Rfl: 0 .  metFORMIN (GLUCOPHAGE) 500 MG tablet, TAKE 1 TABLET TWICE A DAY WITH MEALS, Disp: 180 tablet, Rfl: 1 .  metoprolol succinate (TOPROL-XL) 100 MG 24 hr tablet, TAKE 1 TABLET BY MOUTH  DAILY . TAKE WITH OR  IMMEDIATELY FOLLOWING A  MEAL, Disp: 90 tablet, Rfl: 3 .  Naltrexone-Bupropion HCl ER 8-90 MG TB12, Take 1 tab daily x 1 week, then increase to 1 tab BID  x 1 week, then increase to 2 tabs in am, 1 in pm x 1 week, then take 2 tabs BID thereafter, Disp: 120 tablet, Rfl: 1 .  potassium chloride SA (K-DUR,KLOR-CON) 20 MEQ tablet, Take 1 tablet (20 mEq total) by mouth daily., Disp: 90 tablet, Rfl: 1 .  rosuvastatin (CRESTOR) 10 MG tablet, TAKE 1 TABLET BY MOUTH  DAILY, Disp: 90 tablet, Rfl: 1   Allergies:  Aspirin; Darvon [propoxyphene]; Latex; and Naproxen  Review of Systems: Gen:  Denies  fever, sweats. HEENT: Denies blurred vision. Cvc:  No dizziness, chest pain or heaviness Resp:   Denies cough or sputum porduction. Gi: Denies swallowing difficulty, stomach pain. constipation, bowel incontinence Gu:  Denies bladder incontinence, burning urine Ext:   No Joint pain,  stiffness. Skin: No skin rash, easy bruising. Endoc:  No polyuria, polydipsia. Psych: No depression, insomnia. Other:  All other systems were reviewed and found to be negative other than what is mentioned in the HPI.   Physical Examination:   VS: BP 110/80 (BP Location: Left Arm, Cuff Size: Normal)   Pulse 77   Resp 16   Ht  (1.651 m)   Wt 254 lb (115.2 kg)   SpO2 100%   BMI 42.27 kg/m   General Appearance: No distress  Neuro:without focal findings,  speech normal,  HEENT: PERRLA, EOM intact. Pulmonary: normal breath sounds, No wheezing.   CardiovascularNormal S1,S2.  No m/r/g.   Abdomen: Benign, Soft, non-tender. Renal:  No costovertebral tenderness  GU:  Not performed at this time. Endoc: No evident thyromegaly, no signs of acromegaly. Skin:   warm, no rash. Extremities: normal, no cyanosis, clubbing.   LABORATORY PANEL:   CBC No results for input(s): WBC, HGB, HCT, PLT in the last 168 hours. ------------------------------------------------------------------------------------------------------------------  Chemistries  No results for input(s): NA, K, CL, CO2, GLUCOSE, BUN, CREATININE, CALCIUM, MG, AST, ALT, ALKPHOS, BILITOT in the last 168 hours.  Invalid input(s): GFRCGP ------------------------------------------------------------------------------------------------------------------  Cardiac Enzymes No results for input(s): TROPONINI in the last 168 hours. ------------------------------------------------------------  RADIOLOGY:    Results for orders placed during the hospital encounter of 09/12/14  DG Chest 2 View   Narrative CLINICAL DATA:  Dyspnea on exertion, history of smoking  EXAM: CHEST  2 VIEW  COMPARISON:  None.  FINDINGS: Cardiac shadow is within normal limits. The lungs are well aerated bilaterally. Mild interstitial changes are seen without focal infiltrate. No effusion or pneumothorax is noted.  IMPRESSION: No active cardiopulmonary  disease.   Electronically Signed   By: Alcide Clever M.D.   On: 09/12/2014 12:39    ------------------------------------------------------------------------------------------------------------------  Thank  you for allowing [facility] to assist in the care of your patient. Our recommendations are noted above.  Please contact us if we can be of further service.   Wells Guiles, MD.  [facility] Office Number: [phone]  Santiago Glad, M.D.  Billy Fischer, M.D  04/21/2017

## 2017-04-22 ENCOUNTER — Encounter: Payer: Self-pay | Admitting: Internal Medicine

## 2017-04-22 ENCOUNTER — Ambulatory Visit (INDEPENDENT_AMBULATORY_CARE_PROVIDER_SITE_OTHER): Payer: Medicare Other | Admitting: Internal Medicine

## 2017-04-22 VITALS — BP 110/80 | HR 77 | Resp 16 | Ht 65.0 in | Wt 254.0 lb

## 2017-04-22 DIAGNOSIS — F17210 Nicotine dependence, cigarettes, uncomplicated: Secondary | ICD-10-CM

## 2017-04-22 DIAGNOSIS — J438 Other emphysema: Secondary | ICD-10-CM

## 2017-04-22 DIAGNOSIS — Z720 Tobacco use: Secondary | ICD-10-CM

## 2017-04-22 DIAGNOSIS — G4733 Obstructive sleep apnea (adult) (pediatric): Secondary | ICD-10-CM

## 2017-04-22 MED ORDER — VARENICLINE TARTRATE 0.5 MG X 11 & 1 MG X 42 PO MISC: ORAL | 0 refills | Status: DC

## 2017-04-22 NOTE — Patient Instructions (Signed)
Continue using cpap every night.   --Quitting smoking is the most important thing that you can do for your health.  --Quitting smoking will have greater affect on your health than any medicine that we can give you.   --The best way to quit is to set a quit date, usually a day that has meaning like someone's birthday.  --Start any medication prescribed for quitting one week before you quit date. Then toss out the cigarettes on your quit date.  --If you start smoking again, start from scratch--set another quit day and try again!

## 2017-05-15 ENCOUNTER — Ambulatory Visit: Payer: Medicare Other | Admitting: Family Medicine

## 2017-05-15 ENCOUNTER — Ambulatory Visit (INDEPENDENT_AMBULATORY_CARE_PROVIDER_SITE_OTHER): Payer: Medicare Other | Admitting: Primary Care

## 2017-05-15 ENCOUNTER — Encounter: Payer: Self-pay | Admitting: Primary Care

## 2017-05-15 ENCOUNTER — Ambulatory Visit: Payer: Medicare Other | Admitting: Internal Medicine

## 2017-05-15 VITALS — BP 118/74 | HR 76 | Temp 98.0°F | Ht 65.0 in | Wt 251.4 lb

## 2017-05-15 DIAGNOSIS — J302 Other seasonal allergic rhinitis: Secondary | ICD-10-CM

## 2017-05-15 DIAGNOSIS — J3489 Other specified disorders of nose and nasal sinuses: Secondary | ICD-10-CM | POA: Diagnosis not present

## 2017-05-15 MED ORDER — PREDNISONE 10 MG PO TABS: ORAL_TABLET | ORAL | 0 refills | Status: DC

## 2017-05-15 NOTE — Patient Instructions (Signed)
Start prednisone tablets. Take three tablets for 2 days, then two tablets for 2 days, then one tablet for 2 days.  Use your albuterol inhaler if you develop shortness of breath and/or wheezing.  Cough/Congestion: Try taking Mucinex DM. This will help loosen up the mucous in your chest. Ensure you take this medication with a full glass of water.  Switch from Allegra-D to plain Allegra, Claritin, or Zyrtec.  Ensure you are staying hydrated with fluids and rest. Symptoms should be better by Monday next week.   It was a pleasure meeting you!

## 2017-05-15 NOTE — Progress Notes (Signed)
Subjective:    Patient ID: Kim Harding, female    DOB: Dec 02, 1945, 71 y.o.   MRN: [id number]  HPI  Ms. Eldred MangesStalling is a 71 year old female with a history of COPD, OSA, GERD, Type 2 Diabetes who presents today with a chief complaint of sore throat. She also reports headache, hoarse voice, sinus pressure, nasal congestion, moderate post nasal drip, mild cough. Her symptoms began four days ago.   She denies shortness of breath, fevers, productive cough, sick contacts. She's been unable to use her CPAP machine for the past 2 nights due to congestion. She's taken Allegra-D and Advil for the past 3 nights with temporary improvement in sleep.   Review of Systems  Constitutional: Negative for fatigue and fever.  HENT: Positive for congestion, postnasal drip, rhinorrhea, sinus pressure and voice change. Negative for ear pain and sore throat.   Respiratory: Negative for cough and shortness of breath.   Cardiovascular: Negative for chest pain.       Past Medical History:  Diagnosis Date  . Allergy   . Arthritis   . Chicken pox   . Diabetes mellitus without complication (HCC)   . Diverticulitis   . Hyperlipidemia   . Hypertension      Social History   Social History  . Marital status: Widowed    Spouse name: N/A  . Number of children: N/A  . Years of education: N/A   Occupational History  . retired    Social History Main Topics  . Smoking status: Current Every Day Smoker    Packs/day: 0.25    Years: 50.00    Types: Cigarettes  . Smokeless tobacco: Never Used  . Alcohol use No  . Drug use: No  . Sexual activity: Not Currently   Other Topics Concern  . Not on file   Social History Narrative   [employer] activities, [employer]    Past Surgical History:  Procedure Laterality Date  . ABDOMINAL HYSTERECTOMY  1994   partial  . BARIATRIC SURGERY     sleeve--2014  . CHOLECYSTECTOMY  2012  . REPLACEMENT TOTAL KNEE BILATERAL    . ROTATOR CUFF REPAIR Right 11/21/2015  .  SHOULDER SURGERY Left 09/2014   rotator cuff  . TONSILLECTOMY  1960    Family History  Problem Relation Age of Onset  . Heart disease Mother   . Arthritis Father   . Cancer Father        Prostate  . Diabetes Sister   . Diabetes Brother   . Breast cancer Neg Hx     Allergies  Allergen Reactions  . Aspirin Hives  . Darvon [Propoxyphene] Hives  . Latex Hives, Itching and Swelling  . Naproxen Hives and Itching    Current Outpatient Prescriptions on File Prior to Visit  Medication Sig Dispense Refill  . albuterol (PROVENTIL HFA;VENTOLIN HFA) 108 (90 Base) MCG/ACT inhaler Inhale 2 puffs into the lungs every 6 (six) hours as needed for wheezing or shortness of breath. 6.7 Inhaler 3  . Cholecalciferol (VITAMIN D-3) 1000 UNITS CAPS Take 1 capsule by mouth daily.    Marland Kitchen. esomeprazole (NEXIUM) 40 MG capsule TAKE 1 CAPSULE BY MOUTH  DAILY AT 12 NOON 90 capsule 1  . hydrochlorothiazide (HYDRODIURIL) 25 MG tablet Take 1 tablet (25 mg total) by mouth daily. 90 tablet 3  . meclizine (ANTIVERT) 25 MG tablet Take 1 tablet (25 mg total) by mouth 3 (three) times daily as needed for dizziness. 90 tablet 0  . metFORMIN (GLUCOPHAGE)  500 MG tablet TAKE 1 TABLET TWICE A DAY WITH MEALS 180 tablet 1  . metoprolol succinate (TOPROL-XL) 100 MG 24 hr tablet TAKE 1 TABLET BY MOUTH  DAILY . TAKE WITH OR  IMMEDIATELY FOLLOWING A  MEAL 90 tablet 3  . Naltrexone-Bupropion HCl ER 8-90 MG TB12 Take 1 tab daily x 1 week, then increase to 1 tab BID x 1 week, then increase to 2 tabs in am, 1 in pm x 1 week, then take 2 tabs BID thereafter 120 tablet 1  . potassium chloride SA (K-DUR,KLOR-CON) 20 MEQ tablet Take 1 tablet (20 mEq total) by mouth daily. 90 tablet 1  . rosuvastatin (CRESTOR) 10 MG tablet TAKE 1 TABLET BY MOUTH  DAILY 90 tablet 1  . varenicline (CHANTIX STARTING MONTH PAK) 0.5 MG X 11 & 1 MG X 42 tablet Take one 0.5 mg tablet by mouth once daily for 3 days, then increase to one 0.5 mg tablet twice daily for 4  days, then increase to one 1 mg tablet twice daily. 53 tablet 0   No current facility-administered medications on file prior to visit.     BP 118/74   Pulse 76   Temp 98 F (36.7 C) (Oral)   Ht 5\' 5"  (1.651 m)   Wt 251 lb 6.4 oz (114 kg)   SpO2 97%   BMI 41.84 kg/m    Objective:   Physical Exam  Constitutional: She appears well-nourished. She does not appear ill.  HENT:  Right Ear: Tympanic membrane and ear canal normal.  Left Ear: Tympanic membrane and ear canal normal.  Nose: Mucosal edema present. Right sinus exhibits maxillary sinus tenderness. Right sinus exhibits no frontal sinus tenderness. Left sinus exhibits maxillary sinus tenderness. Left sinus exhibits no frontal sinus tenderness.  Mouth/Throat: Oropharynx is clear and moist.  Eyes: Conjunctivae are normal.  Neck: Neck supple.  Cardiovascular: Normal rate and regular rhythm.   Pulmonary/Chest: Effort normal and breath sounds normal. She has no wheezes. She has no rales.  Lymphadenopathy:    She has no cervical adenopathy.  Skin: Skin is warm and dry.          Assessment & Plan:  Allergic Rhinitis:  Nasal congestion, sinus pressure, post nasal drip x 4 days. Cannot use nasal sprays. Some improvement with allegra-D. Will have her switch to plain Allegra. Rx for prednisone course sent for congestion and sinus pressure. Will have her trial Mucinex if needed. Use albuterol if needed. No need for antibiotics at this point. Do not suspect COPD exacerbation given lack of cardinal signs. Fluids, rest, follow up PRN.  Morrie Sheldon, NP

## 2017-05-22 ENCOUNTER — Encounter: Payer: Self-pay | Admitting: Internal Medicine

## 2017-05-22 ENCOUNTER — Ambulatory Visit (INDEPENDENT_AMBULATORY_CARE_PROVIDER_SITE_OTHER): Payer: Medicare Other | Admitting: Internal Medicine

## 2017-05-22 VITALS — BP 120/78 | HR 68 | Temp 98.3°F

## 2017-05-22 DIAGNOSIS — T3695XA Adverse effect of unspecified systemic antibiotic, initial encounter: Secondary | ICD-10-CM | POA: Diagnosis not present

## 2017-05-22 DIAGNOSIS — B379 Candidiasis, unspecified: Secondary | ICD-10-CM

## 2017-05-22 DIAGNOSIS — J069 Acute upper respiratory infection, unspecified: Secondary | ICD-10-CM | POA: Diagnosis not present

## 2017-05-22 MED ORDER — AMOXICILLIN 875 MG PO TABS: 875.0000 mg | ORAL_TABLET | Freq: Two times a day (BID) | ORAL | 0 refills | Status: DC

## 2017-05-22 MED ORDER — TERCONAZOLE 0.8 % VA CREA: 1.0000 | TOPICAL_CREAM | Freq: Every day | VAGINAL | 0 refills | Status: DC

## 2017-05-22 MED ORDER — FLUTICASONE PROPIONATE 50 MCG/ACT NA SUSP: 2.0000 | Freq: Every day | NASAL | 6 refills | Status: DC

## 2017-05-22 NOTE — Progress Notes (Signed)
HPI  Pt presents to the clinic today for followup of allergy/sinus symptoms. She was seen 05/15/17 for the same. She was diagnosed with seasonal allergies. She was advised to change Allegra D to [employer]. She was advised to try Mucinex as needed. She was given a RX for Prednisone which she has finished with little improvement. She reports runny nose, nasal congestion, right ear pain and cough. She is blowing clear mucous out of her nose. She describes the ear pain as a dull ache. She denies loss of hearing. The cough is nonproductive. She denies fever, chills or body aches, but reports she doesn't feel well overall. She has a history of allergies, COPD and has had sick contacts.  Review of Systems        Past Medical History:  Diagnosis Date  . Allergy   . Arthritis   . Chicken pox   . Diabetes mellitus without complication (HCC)   . Diverticulitis   . Hyperlipidemia   . Hypertension     Family History  Problem Relation Age of Onset  . Heart disease Mother   . Arthritis Father   . Cancer Father        Prostate  . Diabetes Sister   . Diabetes Brother   . Breast cancer Neg Hx     Social History   Social History  . Marital status: Widowed    Spouse name: N/A  . Number of children: N/A  . Years of education: N/A   Occupational History  . retired    Social History Main Topics  . Smoking status: Current Every Day Smoker    Packs/day: 0.25    Years: 50.00    Types: Cigarettes  . Smokeless tobacco: Never Used  . Alcohol use No  . Drug use: No  . Sexual activity: Not Currently   Other Topics Concern  . Not on file   Social History Narrative   Church activities, [employer]ies    Allergies  Allergen Reactions  . Aspirin Hives  . Darvon [Propoxyphene] Hives  . Latex Hives, Itching and Swelling  . Naproxen Hives and Itching     Constitutional: Positive fatigue. Denies headache, fever or abrupt weight changes.  HEENT:  Positive runny nose, nasal congestion and ear  fullness. Denies eye redness, eye pain, pressure behind the eyes, facial pain, ear pain, ringing in the ears, wax buildup, or bloody nose. Respiratory: Positive cough. Denies difficulty breathing or shortness of breath.  Cardiovascular: Denies chest pain, chest tightness, palpitations or swelling in the hands or feet.   No other specific complaints in a complete review of systems (except as listed in HPI above).  Objective:   BP 120/78   Pulse 68   Temp 98.3 F (36.8 C) (Oral)   SpO2 97%  Wt Readings from Last 3 Encounters:  05/15/17 251 lb 6.4 oz (114 kg)  04/22/17 254 lb (115.2 kg)  02/04/17 241 lb 8 oz (109.5 kg)     General: Appears her stated age, ill appearing, in NAD. HEENT: Head: normal shape and size, no sinus tenderness noted; Ears: Tm's red but intact, normal light reflex; Nose: mucosa boggy and moist, turbinates swollen; Throat/Mouth: + PND. Teeth present, mucosa erythematous and moist, no exudate noted, no lesions or ulcerations noted.  Neck: No cervical lymphadenopathy.  Pulmonary/Chest: Normal effort and positive vesicular breath sounds. No respiratory distress. No wheezes, rales or ronchi noted.       Assessment & Plan:   Upper Respiratory Infection:  Get some rest  and drink plenty of water Continue Allegra eRx for Flonase 1 spray each nostril daily x 1 week eRx for Amoxil 875 mg BID x 10 days eRx for Terazol daily x 6 days for antibiotic induced yeast infection Delsym as needed for cough  RTC as needed or if symptoms persist.   Nicki Reaper, NP

## 2017-05-22 NOTE — Patient Instructions (Signed)
Upper Respiratory Infection, Adult Most upper respiratory infections (URIs) are caused by a virus. A URI affects the nose, throat, and upper air passages. The most common type of URI is often called "the common cold." Follow these instructions at home:  Take medicines only as told by your doctor.  Gargle warm saltwater or take cough drops to comfort your throat as told by your doctor.  Use a warm mist humidifier or inhale steam from a shower to increase air moisture. This may make it easier to breathe.  Drink enough fluid to keep your pee (urine) clear or pale yellow.  Eat soups and other clear broths.  Have a healthy diet.  Rest as needed.  Go back to work when your fever is gone or your doctor says it is okay. ? You may need to stay home longer to avoid giving your URI to others. ? You can also wear a face mask and wash your hands often to prevent spread of the virus.  Use your inhaler more if you have asthma.  Do not use any tobacco products, including cigarettes, chewing tobacco, or electronic cigarettes. If you need help quitting, ask your doctor. Contact a doctor if:  You are getting worse, not better.  Your symptoms are not helped by medicine.  You have chills.  You are getting more short of breath.  You have brown or red mucus.  You have yellow or brown discharge from your nose.  You have pain in your face, especially when you bend forward.  You have a fever.  You have puffy (swollen) neck glands.  You have pain while swallowing.  You have white areas in the back of your throat. Get help right away if:  You have very bad or constant: ? Headache. ? Ear pain. ? Pain in your forehead, behind your eyes, and over your cheekbones (sinus pain). ? Chest pain.  You have long-lasting (chronic) lung disease and any of the following: ? Wheezing. ? Long-lasting cough. ? Coughing up blood. ? A change in your usual mucus.  You have a stiff neck.  You have  changes in your: ? Vision. ? Hearing. ? Thinking. ? Mood. This information is not intended to replace advice given to you by your health care provider. Make sure you discuss any questions you have with your health care provider. Document Released: 12/25/2007 Document Revised: 03/10/2016 Document Reviewed: 10/13/2013 Elsevier Interactive Patient Education  2018 Elsevier Inc.  

## 2017-05-26 ENCOUNTER — Ambulatory Visit: Payer: Medicare Other | Admitting: Internal Medicine

## 2017-06-13 ENCOUNTER — Ambulatory Visit: Payer: Self-pay | Admitting: *Deleted

## 2017-06-13 NOTE — Telephone Encounter (Signed)
   Reason for Disposition . [1] Known COPD or other severe lung disease (i.e., bronchiectasis, cystic fibrosis, lung surgery) AND [2] worsening symptoms (i.e., increased sputum purulence or amount, increased breathing difficulty  Answer Assessment - Initial Assessment Questions 1. ONSET: "When did the cough begin?"      2 weeks- patient gets more congested when she talks 2. SEVERITY: "How bad is the cough today?"      7 on scale- patient states she rattles and then she starts coughing 3. RESPIRATORY DISTRESS: "Describe your breathing."      Patient uses CPAP at night- she does better of R side then L side- she coughs more on the L 4. FEVER: "Do you have a fever?" If so, ask: "What is your temperature, how was it measured, and when did it start?"     no 5. SPUTUM: "Describe the color of your sputum" (clear, white, yellow, green)     clear 6. HEMOPTYSIS: "Are you coughing up any blood?" If so ask: "How much?" (flecks, streaks, tablespoons, etc.)     no 7. CARDIAC HISTORY: "Do you have any history of heart disease?" (e.g., heart attack, congestive heart failure)      No- high blood pressure 8. LUNG HISTORY: "Do you have any history of lung disease?"  (e.g., pulmonary embolus, asthma, emphysema)     COPD 9. PE RISK FACTORS: "Do you have a history of blood clots?" (or: recent major surgery, recent prolonged travel, bedridden )     no 10. OTHER SYMPTOMS: "Do you have any other symptoms?" (e.g., runny nose, wheezing, chest pain)       Patient was treated for sinus infection 1 week ago- rattling in chest 11. PREGNANCY: "Is there any chance you are pregnant?" "When was your last menstrual period?"       n/a 12. TRAVEL: "Have you traveled out of the country in the last month?" (e.g., travel history, exposures)       n/a  Protocols used: COUGH - ACUTE PRODUCTIVE-A-AH

## 2017-06-16 ENCOUNTER — Encounter: Payer: Self-pay | Admitting: Family Medicine

## 2017-06-16 ENCOUNTER — Ambulatory Visit: Payer: Medicare Other | Admitting: Family Medicine

## 2017-06-16 VITALS — BP 138/78 | HR 83 | Temp 98.2°F | Wt 256.5 lb

## 2017-06-16 DIAGNOSIS — R05 Cough: Secondary | ICD-10-CM

## 2017-06-16 DIAGNOSIS — J449 Chronic obstructive pulmonary disease, unspecified: Secondary | ICD-10-CM | POA: Diagnosis not present

## 2017-06-16 DIAGNOSIS — R059 Cough, unspecified: Secondary | ICD-10-CM

## 2017-06-16 MED ORDER — SPACER/AERO CHAMBER MOUTHPIECE MISC: 1.0000 [IU] | 0 refills | Status: DC | PRN

## 2017-06-16 MED ORDER — PREDNISONE 20 MG PO TABS: ORAL_TABLET | ORAL | 0 refills | Status: DC

## 2017-06-16 MED ORDER — ALBUTEROL SULFATE (2.5 MG/3ML) 0.083% IN NEBU
2.5000 mg | INHALATION_SOLUTION | Freq: Once | RESPIRATORY_TRACT | Status: AC
Start: 2017-06-16 — End: 2017-06-16
  Administered 2017-06-16: 2.5 mg via RESPIRATORY_TRACT

## 2017-06-16 MED ORDER — IPRATROPIUM BROMIDE 0.02 % IN SOLN
0.5000 mg | Freq: Once | RESPIRATORY_TRACT | Status: AC
  Administered 2017-06-16: 0.5 mg via RESPIRATORY_TRACT

## 2017-06-16 NOTE — Progress Notes (Signed)
Subjective:    Patient ID: Kim Harding, female    DOB: [birth year], 71 y.o.   MRN: [id number]  HPI This is a 71 yo female who presents today with cough x 1 month. Was seen 05/22/17 and was treated with amoxicillin, flonase, reports that she never had resolution of cough. Was not able to take flonase- doesn't like to use anything in her nose. Was seen 05/15/17 and was given prednisone 30 x 2, 20 x 2, 10 x 2 with no improvement. Uses CPAP nightly, has COPD, continues to smoke, started Chantix recently with quit date 06/27/17. Wheezing at night. Albuterol doesn't help with cough- doesn't feel like medicine is reaching her lungs. Has not taken Allegra in several weeks. Has post nasal drainage, no ear pain, no headache.   Past Medical History:  Diagnosis Date  . Allergy   . Arthritis   . Chicken pox   . Diabetes mellitus without complication (HCC)   . Diverticulitis   . Hyperlipidemia   . Hypertension    Past Surgical History:  Procedure Laterality Date  . ABDOMINAL HYSTERECTOMY  1994   partial  . BARIATRIC SURGERY     sleeve--2014  . CHOLECYSTECTOMY  2012  . REPLACEMENT TOTAL KNEE BILATERAL    . ROTATOR CUFF REPAIR Right 11/21/2015  . SHOULDER SURGERY Left 09/2014   rotator cuff  . TONSILLECTOMY  1960   Family History  Problem Relation Age of Onset  . Heart disease Mother   . Arthritis Father   . Cancer Father        Prostate  . Diabetes Sister   . Diabetes Brother   . Breast cancer Neg Hx    Social History   Tobacco Use  . Smoking status: Current Every Day Smoker    Packs/day: 0.25    Years: 50.00    Pack years: 12.50    Types: Cigarettes  . Smokeless tobacco: Never Used  Substance Use Topics  . Alcohol use: No    Alcohol/week: 0.0 oz  . Drug use: No      Review of Systems Per HPI    Objective:   Physical Exam  Constitutional: She is oriented to person, place, and time. She appears well-developed and well-nourished. No distress.  HENT:  Head: Normocephalic  and atraumatic.  Nose: Nose normal.  Mouth/Throat: Oropharynx is clear and moist.  Eyes: Conjunctivae are normal.  Neck: Normal range of motion. Neck supple.  Cardiovascular: Normal rate, regular rhythm and normal heart sounds.  Pulmonary/Chest: Effort normal. She has wheezes (scattered posterior expiratory).  Frequent, dry, tight cough  Lymphadenopathy:    She has no cervical adenopathy.  Neurological: She is alert and oriented to person, place, and time.  Skin: Skin is warm and dry. She is not diaphoretic.  Psychiatric: She has a normal mood and affect. Her behavior is normal. Judgment and thought content normal.  Vitals reviewed.     BP 138/78 (BP Location: Right Arm, Patient Position: Sitting, Cuff Size: Normal)   Pulse 83   Temp 98.2 F (36.8 C) (Oral)   Wt 256 lb 8 oz (116.3 kg)   SpO2 97%   BMI 42.68 kg/m  Wt Readings from Last 3 Encounters:  06/16/17 256 lb 8 oz (116.3 kg)  05/15/17 251 lb 6.4 oz (114 kg)  04/22/17 254 lb (115.2 kg)   Patient given albuterol/atrovent nebulizer treatment in office with subjective/objective improvement (near total clearing of wheeze, decreased cough)    Assessment & Plan:  1. Cough -  good results with albuterol/atrovent, will try longer course of prednisone, spacer for albuterol inhaler Patient Instructions  Please start prednisone tomorrow morning  Use spacer with your inhaler Restart Allegra or generic Zyrtec  If not better by end of week, come in for chest xray  - albuterol (PROVENTIL) (2.5 MG/3ML) 0.083% nebulizer solution 2.5 mg - ipratropium (ATROVENT) nebulizer solution 0.5 mg - Spacer/Aero Chamber Mouthpiece MISC; 1 Units by Does not apply route as needed.  Dispense: 1 each; Refill: 0 - predniSONE (DELTASONE) 20 MG tablet; Take 3 tablets x 3 days, 2 x 3 days, 1 x 3 days  Dispense: 18 tablet; Refill: 0 - DG Chest 2 View; Future  2. COPD, mild (HCC) - predniSONE (DELTASONE) 20 MG tablet; Take 3 tablets x 3 days, 2 x 3  days, 1 x 3 days  Dispense: 18 tablet; Refill: 0 - DG Chest 2 View; Future - encouraged her efforts to quit smoking  [name], FNP-BC  [facility], [facility]  06/19/2017 8:24 PM

## 2017-06-16 NOTE — Patient Instructions (Signed)
Please start prednisone tomorrow morning  Use spacer with your inhaler Restart Allegra or generic Zyrtec  If not better by end of week, come in for chest xray

## 2017-06-16 NOTE — Telephone Encounter (Signed)
Called to check on patient and was advised that she is not any worse, but feels that she needs to be seen. Appointment scheduled today with Deboraha Sprangebbie Gessner NP.

## 2017-06-19 ENCOUNTER — Encounter: Payer: Self-pay | Admitting: Family Medicine

## 2017-07-25 ENCOUNTER — Ambulatory Visit: Payer: Medicare HMO | Admitting: Family Medicine

## 2017-07-25 ENCOUNTER — Encounter: Payer: Self-pay | Admitting: Family Medicine

## 2017-07-25 VITALS — BP 104/62 | HR 77 | Temp 98.1°F | Wt 261.2 lb

## 2017-07-25 DIAGNOSIS — M79672 Pain in left foot: Secondary | ICD-10-CM | POA: Diagnosis not present

## 2017-07-25 NOTE — Progress Notes (Signed)
Subjective:    Patient ID: Kim Harding, female    DOB: [birth year], 72 y.o.   MRN: [id number]  HPI This is a 72 yo female who presents today with left foot pain x about 1 month. [place] in [place] most of last month and walked a lot more than she normally does. Wore sneakers (not old). Pain across top and outer side of foot. Started as ache and got worse with more walking. Always hurts with walking, has some swelling and burning. Took some ibuprofen, 800 mg last night with improvement of swelling and pain.  No pain at rest.  No known injury or falls.  No calf pain.  She requests referral to podiatry.  Past Medical History:  Diagnosis Date  . Allergy   . Arthritis   . Chicken pox   . Diabetes mellitus without complication (HCC)   . Diverticulitis   . Hyperlipidemia   . Hypertension    Past Surgical History:  Procedure Laterality Date  . ABDOMINAL HYSTERECTOMY  1994   partial  . BARIATRIC SURGERY     sleeve--2014  . CHOLECYSTECTOMY  2012  . REPLACEMENT TOTAL KNEE BILATERAL    . ROTATOR CUFF REPAIR Right 11/21/2015  . SHOULDER SURGERY Left 09/2014   rotator cuff  . TONSILLECTOMY  1960   Family History  Problem Relation Age of Onset  . Heart disease Mother   . Arthritis Father   . Cancer Father        Prostate  . Diabetes Sister   . Diabetes Brother   . Breast cancer Neg Hx    Social History   Tobacco Use  . Smoking status: Current Every Day Smoker    Packs/day: 0.25    Years: 50.00    Pack years: 12.50    Types: Cigarettes  . Smokeless tobacco: Never Used  Substance Use Topics  . Alcohol use: No    Alcohol/week: 0.0 oz  . Drug use: No      Review of Systems Per HPI    Objective:   Physical Exam  Constitutional: She is oriented to person, place, and time. She appears well-developed and well-nourished. No distress.  Eyes: Conjunctivae are normal.  Cardiovascular: Normal rate.  Pulmonary/Chest: Effort normal.  Musculoskeletal:       Left foot: There is  tenderness (lateral ) and bony tenderness. There is normal range of motion and no swelling.  High arches.   Neurological: She is alert and oriented to person, place, and time.  No calf tenderness or achilles tenderness/deformity.   Skin: Skin is warm and dry. She is not diaphoretic.  Psychiatric: She has a normal mood and affect. Her behavior is normal. Judgment and thought content normal.  Vitals reviewed.     BP 104/62 (BP Location: Right Arm, Patient Position: Sitting, Cuff Size: Large)   Pulse 77   Temp 98.1 F (36.7 C) (Oral)   Wt 261 lb 4 oz (118.5 kg)   SpO2 96%   BMI 43.47 kg/m  Wt Readings from Last 3 Encounters:  07/25/17 261 lb 4 oz (118.5 kg)  06/16/17 256 lb 8 oz (116.3 kg)  05/15/17 251 lb 6.4 oz (114 kg)       Assessment & Plan:  1. Left foot pain -Likely overuse, discussed regular ibuprofen for the next several days, encouraged regular gentle range of motion twice a day - Ambulatory referral to Podiatry   Olean Reeeborah Courtlynn Holloman, FNP-BC  [facility], [facility]  07/25/2017 8:55 AM

## 2017-07-25 NOTE — Patient Instructions (Signed)
Please take 2-3 ibuprofen (400-600 mg) twice a day for the next 5-7 days for inflammation and pain  Do gentle range of motion exercises (write the alphabet with your foot twice a day)  I have put in a referral to podiatry.

## 2017-07-28 ENCOUNTER — Ambulatory Visit: Payer: Medicare HMO

## 2017-07-28 ENCOUNTER — Encounter: Payer: Self-pay | Admitting: Podiatry

## 2017-07-28 ENCOUNTER — Ambulatory Visit: Payer: Medicare HMO | Admitting: Podiatry

## 2017-07-28 DIAGNOSIS — M79673 Pain in unspecified foot: Secondary | ICD-10-CM

## 2017-07-28 DIAGNOSIS — M7672 Peroneal tendinitis, left leg: Secondary | ICD-10-CM

## 2017-07-28 NOTE — Progress Notes (Signed)
   Subjective:    Patient ID: Kim Harding, female    DOB: 02-28-1946, 72 y.o.   MRN: [id number]  HPIthis patient presents the office with chief complaint of pain and burning on the outside of her left foot below her ankle.  She says this pain has been present for approximately 4 weeks.  She says it has gotten  progressively worse, especially during the holidays.  She denies any history of trauma or injury to the foot.She says that Advil helps but so does rest.  This patient is diabetic, type II, without complications. She presents the office today for definitive evaluation and treatment of her left foot    Review of Systems  HENT: Positive for sinus pressure.   Musculoskeletal: Positive for arthralgias.  All other systems reviewed and are negative.      Objective:   Physical Exam General Appearance  Alert, conversant and in no acute stress.  Vascular  Dorsalis pedis and posterior pulses are palpable  bilaterally.  Capillary return is within normal limits  bilaterally. Temperature is within normal limits  Bilaterally.  Neurologic  Senn-Weinstein monofilament wire test within normal limits  bilaterally. Muscle power within normal limits bilaterally.  Nails Thick disfigured discolored nails with subungual debris hallux  Bilateral.  Orthopedic  No limitations of motion of motion feet bilaterally.  No crepitus or effusions noted.  Patient has palpable pain noted proximal to the insertion of the peroneal tendon of the left foot.  There is significant swelling noted on both feet but the left has greater swelling in the right.  Muscle power is within normal limits and the peroneals strength is intact.  There is only mild increased temperature noted along the course of the peroneal brevis tendon, left foot. Sinus tarsi pain noted left foot.  Skin  normotropic skin with no porokeratosis noted bilaterally.  No signs of infections or ulcers noted.          Assessment & Plan:  Peroneal  tendinitis left foot  Swelling  B/L  IE  X-rays taken of the left foot does reveal calcification at the insertion of the Achilles tendon.  There is calcification at the insertion of the peroneal tendon left.  There is bony reactivity noted at the proximal aspect of the fifth metatarsal at the insertion of the peroneal tendon left foot.  There is os pereneum noted. After examination of her foot and 17 in the x-ray we discussed treatment.  We discussed treatment with insoles medication and injection therapy.  We also discussed immobilization of her left foot/ankle.  Patient opted for the immobilization and an Unna boot was applied to the left ankle and she was dispensed a surgical shoe.  She is to return to the office in one week.   Helane GuntherGregory Sayer Masini DPM

## 2017-08-04 ENCOUNTER — Encounter: Payer: Self-pay | Admitting: Podiatry

## 2017-08-04 ENCOUNTER — Other Ambulatory Visit: Payer: Self-pay | Admitting: Internal Medicine

## 2017-08-04 ENCOUNTER — Ambulatory Visit: Payer: Medicare HMO | Admitting: Podiatry

## 2017-08-04 DIAGNOSIS — M25572 Pain in left ankle and joints of left foot: Secondary | ICD-10-CM | POA: Diagnosis not present

## 2017-08-04 DIAGNOSIS — E119 Type 2 diabetes mellitus without complications: Secondary | ICD-10-CM

## 2017-08-04 DIAGNOSIS — M79673 Pain in unspecified foot: Secondary | ICD-10-CM

## 2017-08-04 DIAGNOSIS — M7672 Peroneal tendinitis, left leg: Secondary | ICD-10-CM

## 2017-08-04 NOTE — Progress Notes (Signed)
This patient returns to the office follow-up for diagnosis of a peroneal tendinitis, left foot.  She also had sinus tarsi left foot with swelling.  She was treated with an Radio broadcast assistantUnna boot and a surgical shoe for one week.  She presents the office today stating that after the Unna boot was removed she is not having any swelling or pain in her left ankle. She is very pleased with her progress.  She presents the office today for continued evaluation and treatment of her left foot/ankle.  General Appearance  Alert, conversant and in no acute stress.  Vascular  Dorsalis pedis and posterior pulses are palpable  bilaterally.  Capillary return is within normal limits  bilaterally. Temperature is within normal limits  Bilaterally.  Neurologic  Senn-Weinstein monofilament wire test within normal limits  bilaterally. Muscle power within normal limits bilaterally.  Nails Thick disfigured discolored nails with subungual debris bilaterally from hallux to fifth toes bilaterally. No evidence of bacterial infection or drainage bilaterally.  Orthopedic  No limitations of motion of motion feet bilaterally.  No crepitus or effusions noted.  No bony pathology or digital deformities noted. There is continued palpable pain noted in the sinus tarsi of the left ankle/foot.  The swelling and increased temperature of the left ankle has resolved.  Patient has normal range of motion at the foot/ankle.    Skin  normotropic skin with no porokeratosis noted bilaterally.  No signs of infections or ulcers noted.    Peroneal tendinitis left ankle  Sinus tarsitis left   ROV.Marland Kitchen. Unna boot was removed and her foot/ankle was examined.  She only had pain in the sinus tarsi area and her swelling has diminished.  She was instructed to use the compression anklet on her left ankle and return to her regular footgear.  She is to return to the office in 3 weeks for further evaluation and treatment.  If the pain persists in the sinus tarsi, I will  consider injection therapy.   Helane GuntherGregory Usha Slager DPM

## 2017-08-18 ENCOUNTER — Encounter: Payer: Self-pay | Admitting: Podiatry

## 2017-08-18 ENCOUNTER — Ambulatory Visit: Payer: Medicare HMO | Admitting: Podiatry

## 2017-08-18 DIAGNOSIS — M25572 Pain in left ankle and joints of left foot: Secondary | ICD-10-CM

## 2017-08-18 DIAGNOSIS — M79673 Pain in unspecified foot: Secondary | ICD-10-CM | POA: Diagnosis not present

## 2017-08-18 DIAGNOSIS — M7672 Peroneal tendinitis, left leg: Secondary | ICD-10-CM

## 2017-08-18 NOTE — Progress Notes (Signed)
This patient presents to the office 2 weeks after she was discharged from the office.  She was treated for peroneal tendinitis with an Unna boot and became painfree    Her peroneal tendinitis had resolved and her sinus tarsitis had diminished.  She presents the office today stating she is unable to walk without a limp due to the pain that has returned to her left foot..   She points to the area at the base of the fifth metatarsal as the site of her most pain.  She returns to the office today for continued evaluation and treatment of her painful left foot.   General Appearance  Alert, conversant and in no acute stress.  Vascular  Dorsalis pedis and posterior pulses are palpable  bilaterally.  Capillary return is within normal limits  bilaterally. Temperature is within normal limits  Bilaterally.  Neurologic  Senn-Weinstein monofilament wire test within normal limits  bilaterally. Muscle power within normal limits bilaterally.  Nails Thick disfigured discolored nails with subungual debris bilaterally from hallux to fifth toes bilaterally. No evidence of bacterial infection or drainage bilaterally.  Orthopedic  No limitations of motion of motion feet bilaterally.  No crepitus or effusions noted.  No bony pathology or digital deformities noted.there is continued palpable pain noted in the sinus tarsi of her left ankle/foot.  There is palpable pain noted along the course of the peroneal tendon inserting at the base of the fifth metatarsal.    Skin  normotropic skin with no porokeratosis noted bilaterally.  No signs of infections or ulcers noted.    Peroneal tendinitis left foot.  Sinus tarsi left foot.  ROV  Injection therapy in  the sinus tarsi left foot.  Injection therapy using 1.0 cc. Of 2% xylocaine( 20 mg.) plus 1 cc. of kenalog-la ( 10 mg) plus 1/2 cc. of dexamethazone phosphate ( 2 mg) foot.  Cam walker was dispensed.  RTC 2 weeks.   Helane GuntherGregory Eliyah Mcshea DPM

## 2017-08-25 ENCOUNTER — Ambulatory Visit: Payer: Medicare HMO | Admitting: Podiatry

## 2017-08-28 ENCOUNTER — Encounter: Payer: Self-pay | Admitting: Podiatry

## 2017-08-28 ENCOUNTER — Ambulatory Visit: Payer: Medicare HMO | Admitting: Podiatry

## 2017-08-28 DIAGNOSIS — M7672 Peroneal tendinitis, left leg: Secondary | ICD-10-CM | POA: Diagnosis not present

## 2017-08-28 DIAGNOSIS — M25572 Pain in left ankle and joints of left foot: Secondary | ICD-10-CM | POA: Diagnosis not present

## 2017-08-28 NOTE — Progress Notes (Signed)
This patient presents to the office for continued diagnosis of a peroneal tendinitis, left foot and sinus tarsitis, left foot.  She was seen 2 weeks ago and she was given a Cam Walker as well as an injection in the sinus tarsi.  She says she is having minimal pain and discomfort since she started ambulating with a cam walker.  She presents the office today for continued evaluation and treatment of her left foot   General Appearance  Alert, conversant and in no acute stress.  Vascular  Dorsalis pedis and posterior pulses are palpable  bilaterally.  Capillary return is within normal limits  bilaterally. Temperature is within normal limits  Bilaterally.  Neurologic  Senn-Weinstein monofilament wire test within normal limits  bilaterally. Muscle power within normal limits bilaterally.  Nails Thick disfigured discolored nails with subungual debris bilaterally from hallux to fifth toes bilaterally. No evidence of bacterial infection or drainage bilaterally.  Orthopedic  No limitations of motion of motion feet bilaterally.  No crepitus or effusions noted.  No bony pathology or digital deformities noted.there is continued palpable pain noted in the sinus tarsi of her left ankle/foot.  Pain and swelling has resolved along the course of the peroneal tendon left foot.   Skin  normotropic skin with no porokeratosis noted bilaterally.  No signs of infections or ulcers noted.    Peroneal tendinitis left foot.  Sinus tarsi left foot.  ROV  Patient says she is not interested in a second sinus tarsi injection.  She was dispensed power step insoles and told her to wear these power step insoles in thick soled shoes.  Patient says she is not interested in any anti-inflammatory medication.  She is to return to the office in 3 weeks for continued evaluation and treatment.  She may be a candidate for orthotics.   Helane GuntherGregory Barba Solt DPM   Helane GuntherGregory Kourtni Stineman DPM

## 2017-09-04 ENCOUNTER — Telehealth: Payer: Self-pay | Admitting: Podiatry

## 2017-09-04 MED ORDER — METHYLPREDNISOLONE 4 MG PO TABS: ORAL_TABLET | ORAL | 0 refills | Status: DC

## 2017-09-04 NOTE — Telephone Encounter (Signed)
Per Dr. Stacie AcresMayer, he will send in a Medrol dose pack and she will need to go back into her boot until seen on 09/18/17

## 2017-09-18 ENCOUNTER — Encounter: Payer: Self-pay | Admitting: Podiatry

## 2017-09-18 ENCOUNTER — Ambulatory Visit: Payer: Medicare HMO | Admitting: Podiatry

## 2017-09-18 DIAGNOSIS — M25572 Pain in left ankle and joints of left foot: Secondary | ICD-10-CM | POA: Diagnosis not present

## 2017-09-18 DIAGNOSIS — R609 Edema, unspecified: Secondary | ICD-10-CM

## 2017-09-18 NOTE — Progress Notes (Signed)
.       This patient presents to the office for continued diagnosis of a peroneal tendinitis, left foot and sinus tarsitis, left foot.  She says she returned  to the office to pick up pain medication due to her severe pain.  She says when she returned home. She put on a special pair of shoes and since that time. She has had no pain in her left foot.  She says that she is 100% better.  She says she tried wearing the inserts and she was unable to wear them since they hurt her feet.  She returns to the office for continued evaluation but has been pain free for weeks   General Appearance  Alert, conversant and in no acute stress.  Vascular  Dorsalis pedis and posterior pulses are palpable  bilaterally.  Capillary return is within normal limits  bilaterally. Temperature is within normal limits  Bilaterally.  Neurologic  Senn-Weinstein monofilament wire test within normal limits  bilaterally. Muscle power within normal limits bilaterally.  Nails Thick disfigured discolored nails with subungual debris bilaterally from hallux to fifth toes bilaterally. No evidence of bacterial infection or drainage bilaterally.  Orthopedic  No limitations of motion of motion feet bilaterally.  No crepitus or effusions noted.  No bony pathology or digital deformities noted.there is continued palpable pain noted in the sinus tarsi of her left ankle/foot.  Pain and swelling is present  along the course of the peroneal tendon left foot.   Skin  normotropic skin with no porokeratosis noted bilaterally.  No signs of infections or ulcers noted.    Peroneal tendinitis left foot.  Sinus tarsi left foot.  ROV  Patient is pleased th her improvement and she was ito continue to wear the shoes that she has been wearing.  The shoes are thick soled shoes .  She was told to wear her compression sock  Due to the swelling of her peroneal longus/brevis tendon, left foot.  RTC prn      Helane GuntherGregory Everlee Quakenbush DPM

## 2017-10-06 DIAGNOSIS — H6090 Unspecified otitis externa, unspecified ear: Secondary | ICD-10-CM | POA: Diagnosis not present

## 2017-10-21 DIAGNOSIS — G4733 Obstructive sleep apnea (adult) (pediatric): Secondary | ICD-10-CM | POA: Diagnosis not present

## 2017-10-26 ENCOUNTER — Other Ambulatory Visit: Payer: Self-pay | Admitting: Internal Medicine

## 2017-11-21 ENCOUNTER — Encounter: Payer: Self-pay | Admitting: Family Medicine

## 2017-11-21 ENCOUNTER — Ambulatory Visit: Payer: Medicare HMO | Admitting: Family Medicine

## 2017-11-21 VITALS — BP 118/64 | HR 76 | Temp 98.5°F | Ht 65.0 in | Wt 255.5 lb

## 2017-11-21 DIAGNOSIS — K6289 Other specified diseases of anus and rectum: Secondary | ICD-10-CM | POA: Diagnosis not present

## 2017-11-21 MED ORDER — HYDROCORTISONE ACETATE 25 MG RE SUPP: 25.0000 mg | Freq: Every day | RECTAL | 0 refills | Status: DC

## 2017-11-21 MED ORDER — HYDROCORTISONE 2.5 % RE CREA: 1.0000 "application " | TOPICAL_CREAM | Freq: Two times a day (BID) | RECTAL | 0 refills | Status: DC

## 2017-11-21 NOTE — Progress Notes (Signed)
Subjective:    Patient ID: Kim Harding, female    DOB: 05/25/1946, 72 y.o.   MRN: [id number]  HPI Here for rectal symptoms from internal or external  hemorrhoids ?   Symptoms started out of town  Bought prep H  (it did help) Also some wipe pads  Uncomfortable and burning and itching to wipe   Usually has bm 2-3 times per day  Sometimes has to stain-not all the time  No blood in stool    Eating more healthy lately - fruits and veggies   Last colonoscopy- was over 5 y ago  Has had polyps in the past    GI history - diverticulitis Also gastric sleeve surgery   Wt Readings from Last 3 Encounters:  11/21/17 255 lb 8 oz (115.9 kg)  07/25/17 261 lb 4 oz (118.5 kg)  06/16/17 256 lb 8 oz (116.3 kg)   Patient Active Problem List   Diagnosis Date Noted  . Anal irritation 11/21/2017  . Arthritis 12/13/2015  . Tobacco abuse 09/06/2014  . OSA on CPAP 09/06/2014  . Diabetes mellitus type 2, controlled, without complications (HCC) 09/01/2014  . Essential hypertension 08/09/2014  . HLD (hyperlipidemia) 08/09/2014  . GERD (gastroesophageal reflux disease) 08/09/2014  . COPD, mild (HCC) 08/09/2014   Past Medical History:  Diagnosis Date  . Allergy   . Arthritis   . Chicken pox   . Diabetes mellitus without complication (HCC)   . Diverticulitis   . Hyperlipidemia   . Hypertension    Past Surgical History:  Procedure Laterality Date  . ABDOMINAL HYSTERECTOMY  1994   partial  . BARIATRIC SURGERY     sleeve--2014  . CHOLECYSTECTOMY  2012  . REPLACEMENT TOTAL KNEE BILATERAL    . ROTATOR CUFF REPAIR Right 11/21/2015  . SHOULDER SURGERY Left 09/2014   rotator cuff  . TONSILLECTOMY  1960   Social History   Tobacco Use  . Smoking status: Current Every Day Smoker    Packs/day: 0.25    Years: 50.00    Pack years: 12.50    Types: Cigarettes  . Smokeless tobacco: Never Used  Substance Use Topics  . Alcohol use: No    Alcohol/week: 0.0 oz  . Drug use: No   Family  History  Problem Relation Age of Onset  . Heart disease Mother   . Arthritis Father   . Cancer Father        Prostate  . Diabetes Sister   . Diabetes Brother   . Breast cancer Neg Hx    Allergies  Allergen Reactions  . Aspirin Hives  . Darvon [Propoxyphene] Hives  . Latex Hives, Itching and Swelling  . Naproxen Hives and Itching   Current Outpatient Medications on File Prior to Visit  Medication Sig Dispense Refill  . albuterol (PROVENTIL HFA;VENTOLIN HFA) 108 (90 Base) MCG/ACT inhaler Inhale 2 puffs into the lungs every 6 (six) hours as needed for wheezing or shortness of breath. 6.7 Inhaler 3  . Cholecalciferol (VITAMIN D-3) 1000 UNITS CAPS Take 1 capsule by mouth daily.    Marland Kitchen esomeprazole (NEXIUM) 40 MG capsule TAKE 1 CAPSULE BY MOUTH  DAILY AT 12 NOON 90 capsule 1  . fluticasone (FLONASE) 50 MCG/ACT nasal spray Place 2 sprays into both nostrils daily. 16 g 6  . hydrochlorothiazide (HYDRODIURIL) 25 MG tablet TAKE 1 TABLET BY MOUTH  DAILY 90 tablet 1  . meclizine (ANTIVERT) 25 MG tablet Take 1 tablet (25 mg total) by mouth 3 (three) times daily  as needed for dizziness. 90 tablet 0  . metFORMIN (GLUCOPHAGE) 500 MG tablet TAKE 1 TABLET TWICE A DAY WITH MEALS 180 tablet 1  . metoprolol succinate (TOPROL-XL) 100 MG 24 hr tablet TAKE 1 TABLET BY MOUTH  DAILY . TAKE WITH OR  IMMEDIATELY FOLLOWING A  MEAL 90 tablet 3  . potassium chloride SA (K-DUR,KLOR-CON) 20 MEQ tablet TAKE 1 TABLET DAILY 90 tablet 1  . rosuvastatin (CRESTOR) 10 MG tablet TAKE 1 TABLET BY MOUTH  DAILY 90 tablet 1  . Spacer/Aero Chamber Mouthpiece MISC 1 Units by Does not apply route as needed. 1 each 0  . Spacer/Aero-Holding Chambers (OPTICHAMBER DIAMOND-LG MASK) DEVI See admin instructions. use with inhaler  0   No current facility-administered medications on file prior to visit.      Review of Systems  Constitutional: Negative for activity change, appetite change, fatigue, fever and unexpected weight change.    HENT: Negative for congestion, ear pain, rhinorrhea, sinus pressure and sore throat.   Eyes: Negative for pain, redness and visual disturbance.  Respiratory: Negative for cough, shortness of breath and wheezing.   Cardiovascular: Negative for chest pain and palpitations.  Gastrointestinal: Positive for rectal pain. Negative for abdominal pain, anal bleeding, blood in stool, constipation and diarrhea.       Rectal irritation - itching and burning  Endocrine: Negative for polydipsia and polyuria.  Genitourinary: Negative for dysuria, frequency and urgency.  Musculoskeletal: Negative for arthralgias, back pain and myalgias.  Skin: Negative for pallor and rash.  Allergic/Immunologic: Negative for environmental allergies.  Neurological: Negative for dizziness, syncope and headaches.  Hematological: Negative for adenopathy. Does not bruise/bleed easily.  Psychiatric/Behavioral: Negative for decreased concentration and dysphoric mood. The patient is not nervous/anxious.        Objective:   Physical Exam  Constitutional: She appears well-developed and well-nourished.  obese and well appearing   HENT:  Head: Normocephalic and atraumatic.  Eyes: Pupils are equal, round, and reactive to light. Conjunctivae and EOM are normal.  Neck: Normal range of motion. Neck supple.  Cardiovascular: Normal rate and regular rhythm.  Pulmonary/Chest: Breath sounds normal. No respiratory distress.  Diffusely distant bs   Abdominal: Bowel sounds are normal. She exhibits no distension and no mass. There is no tenderness. There is no rebound and no guarding.  Genitourinary: Rectal exam shows guaiac positive stool.  Genitourinary Comments: External anal irritation - w/o skin breakdown No obv ext hemorrhoids  Rectal exam-no M noted / trace heme pos stool  Lymphadenopathy:    She has no cervical adenopathy.  Skin: Skin is warm and dry. No rash noted. There is erythema.  Skin of rectum is inflamed looking -w/o  skin breakdown  Psychiatric: She has a normal mood and affect.          Assessment & Plan:   Problem List Items Addressed This Visit      Digestive   Anal irritation - Primary    Irritated area w/o skin breakdown or obv hemorrhoids Trace heme pos stool- pt will approach pcp for colonoscopy ref for screen as well  Suspect proctitis  anusol hc suppos and cream  Clean and dry well after bms also  Avoid straining  Update if not starting to improve in a week or if worsening

## 2017-11-21 NOTE — Patient Instructions (Signed)
Use unscented baby wipes (or TUKS pads if they do not burn) to clean after bowel movements  Try the anusol hc suppository at night for 10 days The cream can be used twice daily   Avoid straining as much as you can   This appears to be anal irritation from staining or possibly hemorrhoids and the medication should help   Coordinate with Nicki Reaper to schedule your colonoscopy once you are doing better

## 2017-11-23 NOTE — Assessment & Plan Note (Signed)
Irritated area w/o skin breakdown or obv hemorrhoids Trace heme pos stool- pt will approach pcp for colonoscopy ref for screen as well  Suspect proctitis  anusol hc suppos and cream  Clean and dry well after bms also  Avoid straining  Update if not starting to improve in a week or if worsening

## 2018-01-10 DIAGNOSIS — G5603 Carpal tunnel syndrome, bilateral upper limbs: Secondary | ICD-10-CM | POA: Diagnosis not present

## 2018-01-10 DIAGNOSIS — G4730 Sleep apnea, unspecified: Secondary | ICD-10-CM | POA: Diagnosis not present

## 2018-01-10 DIAGNOSIS — I10 Essential (primary) hypertension: Secondary | ICD-10-CM | POA: Diagnosis not present

## 2018-01-10 DIAGNOSIS — E119 Type 2 diabetes mellitus without complications: Secondary | ICD-10-CM | POA: Diagnosis not present

## 2018-01-10 DIAGNOSIS — J449 Chronic obstructive pulmonary disease, unspecified: Secondary | ICD-10-CM | POA: Diagnosis not present

## 2018-01-10 DIAGNOSIS — J302 Other seasonal allergic rhinitis: Secondary | ICD-10-CM | POA: Diagnosis not present

## 2018-01-10 DIAGNOSIS — K219 Gastro-esophageal reflux disease without esophagitis: Secondary | ICD-10-CM | POA: Diagnosis not present

## 2018-01-10 DIAGNOSIS — Z6841 Body Mass Index (BMI) 40.0 and over, adult: Secondary | ICD-10-CM | POA: Diagnosis not present

## 2018-01-10 DIAGNOSIS — K5790 Diverticulosis of intestine, part unspecified, without perforation or abscess without bleeding: Secondary | ICD-10-CM | POA: Diagnosis not present

## 2018-01-31 DIAGNOSIS — G5601 Carpal tunnel syndrome, right upper limb: Secondary | ICD-10-CM | POA: Diagnosis not present

## 2018-02-03 ENCOUNTER — Other Ambulatory Visit: Payer: Self-pay | Admitting: Internal Medicine

## 2018-02-06 ENCOUNTER — Encounter: Payer: Self-pay | Admitting: Internal Medicine

## 2018-02-06 ENCOUNTER — Encounter: Payer: Medicare Other | Admitting: Internal Medicine

## 2018-02-06 ENCOUNTER — Ambulatory Visit (INDEPENDENT_AMBULATORY_CARE_PROVIDER_SITE_OTHER): Payer: Medicare HMO | Admitting: Internal Medicine

## 2018-02-06 VITALS — BP 124/80 | HR 71 | Temp 98.2°F | Ht 64.8 in | Wt 253.0 lb

## 2018-02-06 DIAGNOSIS — M1990 Unspecified osteoarthritis, unspecified site: Secondary | ICD-10-CM

## 2018-02-06 DIAGNOSIS — Z780 Asymptomatic menopausal state: Secondary | ICD-10-CM | POA: Diagnosis not present

## 2018-02-06 DIAGNOSIS — I10 Essential (primary) hypertension: Secondary | ICD-10-CM | POA: Diagnosis not present

## 2018-02-06 DIAGNOSIS — Z23 Encounter for immunization: Secondary | ICD-10-CM

## 2018-02-06 DIAGNOSIS — G4733 Obstructive sleep apnea (adult) (pediatric): Secondary | ICD-10-CM | POA: Diagnosis not present

## 2018-02-06 DIAGNOSIS — M79672 Pain in left foot: Secondary | ICD-10-CM

## 2018-02-06 DIAGNOSIS — Z0000 Encounter for general adult medical examination without abnormal findings: Secondary | ICD-10-CM

## 2018-02-06 DIAGNOSIS — E559 Vitamin D deficiency, unspecified: Secondary | ICD-10-CM | POA: Diagnosis not present

## 2018-02-06 DIAGNOSIS — K219 Gastro-esophageal reflux disease without esophagitis: Secondary | ICD-10-CM | POA: Diagnosis not present

## 2018-02-06 DIAGNOSIS — E7800 Pure hypercholesterolemia, unspecified: Secondary | ICD-10-CM

## 2018-02-06 DIAGNOSIS — Z9989 Dependence on other enabling machines and devices: Secondary | ICD-10-CM | POA: Diagnosis not present

## 2018-02-06 DIAGNOSIS — G5601 Carpal tunnel syndrome, right upper limb: Secondary | ICD-10-CM | POA: Diagnosis not present

## 2018-02-06 DIAGNOSIS — E119 Type 2 diabetes mellitus without complications: Secondary | ICD-10-CM | POA: Diagnosis not present

## 2018-02-06 MED ORDER — ZOSTER VAC RECOMB ADJUVANTED 50 MCG/0.5ML IM SUSR: 0.5000 mL | Freq: Once | INTRAMUSCULAR | 0 refills | Status: AC

## 2018-02-06 NOTE — Assessment & Plan Note (Signed)
Stable on Esomeprazole Discussed how weight loss and avoiding foods that trigger reflux can help reduce symptoms CBC and CMET today

## 2018-02-06 NOTE — Assessment & Plan Note (Signed)
Encouraged weight loss  Continue CPAP She will continue to follow with pulmonology 

## 2018-02-06 NOTE — Assessment & Plan Note (Signed)
Encouraged regular activity and exercise for weight loss Continue Ibuprofen as needed

## 2018-02-06 NOTE — Progress Notes (Signed)
HPI:  Pt presents to the clinic today for her Medicare Wellness Exam. She is also due to follow up chronic conditions.  Arthritis: Mainly in her knees. She takes Advil as needed with good relief.  DM 2: Her last A1C was 6.4, 01/2016. She is taking Metformin as prescribed. She is not checking her sugars. She checks her feet daily.  HLD: Her last LDL was 72, 01/2016. She is taking Rosuvastatin as prescribed. She denies myalgias. She tries to consume a low fat diet.  HTN: Her BP today is 124/80. She is taking HCTZ, Potassium and Metoprolol as prescribed. She reports some lower extremity edema. ECG from reviewed.  OSA: She averages 7 hours of sleep per night. She is wearing her CPAP. Sleep study from 02/2015 reviewed. She follows with Dr. Nicholos Johns.   GERD: She denies breakthrough on Esomeprazole. There is no upper GI on file.  Carpal Tunnel, Right:  She is having surgery on Monday.  Left Foot Pain: This has been an ongoing issue. She has worn a boot for a few weeks, had special inserts in her shoes and even had an injection in her foot with minimal relief. She is following with Dr. Stacie Acres.  Past Medical History:  Diagnosis Date  . Allergy   . Arthritis   . Chicken pox   . Diabetes mellitus without complication (HCC)   . Diverticulitis   . Hyperlipidemia   . Hypertension     Current Outpatient Medications  Medication Sig Dispense Refill  . albuterol (PROVENTIL HFA;VENTOLIN HFA) 108 (90 Base) MCG/ACT inhaler Inhale 2 puffs into the lungs every 6 (six) hours as needed for wheezing or shortness of breath. 6.7 Inhaler 3  . Cholecalciferol (VITAMIN D-3) 1000 UNITS CAPS Take 1 capsule by mouth daily.    Marland Kitchen esomeprazole (NEXIUM) 40 MG capsule TAKE 1 CAPSULE BY MOUTH  DAILY AT 12 NOON 90 capsule 1  . fluticasone (FLONASE) 50 MCG/ACT nasal spray Place 2 sprays into both nostrils daily. 16 g 6  . hydrochlorothiazide (HYDRODIURIL) 25 MG tablet TAKE 1 TABLET BY MOUTH  DAILY 90 tablet 0  .  hydrocortisone (ANUSOL-HC) 2.5 % rectal cream Place 1 application rectally 2 (two) times daily. To affected rectal area 30 g 0  . hydrocortisone (ANUSOL-HC) 25 MG suppository Place 1 suppository (25 mg total) rectally at bedtime. 10 suppository 0  . meclizine (ANTIVERT) 25 MG tablet Take 1 tablet (25 mg total) by mouth 3 (three) times daily as needed for dizziness. 90 tablet 0  . metFORMIN (GLUCOPHAGE) 500 MG tablet TAKE 1 TABLET TWICE A DAY WITH MEALS 180 tablet 1  . metoprolol succinate (TOPROL-XL) 100 MG 24 hr tablet TAKE 1 TABLET BY MOUTH  DAILY . TAKE WITH OR  IMMEDIATELY FOLLOWING A  MEAL 90 tablet 3  . potassium chloride SA (K-DUR,KLOR-CON) 20 MEQ tablet TAKE 1 TABLET DAILY 90 tablet 1  . rosuvastatin (CRESTOR) 10 MG tablet TAKE 1 TABLET BY MOUTH  DAILY 90 tablet 1  . Spacer/Aero Chamber Mouthpiece MISC 1 Units by Does not apply route as needed. 1 each 0  . Spacer/Aero-Holding Chambers (OPTICHAMBER DIAMOND-LG MASK) DEVI See admin instructions. use with inhaler  0   No current facility-administered medications for this visit.     Allergies  Allergen Reactions  . Aspirin Hives  . Darvon [Propoxyphene] Hives  . Latex Hives, Itching and Swelling  . Naproxen Hives and Itching    Family History  Problem Relation Age of Onset  . Heart disease Mother   .  Arthritis Father   . Cancer Father        Prostate  . Diabetes Sister   . Diabetes Brother   . Breast cancer Neg Hx     Social History   Socioeconomic History  . Marital status: Widowed    Spouse name: Not on file  . Number of children: Not on file  . Years of education: Not on file  . Highest education level: Not on file  Occupational History  . Occupation: retired  Engineer, production  . Financial resource strain: Not on file  . Food insecurity:    Worry: Not on file    Inability: Not on file  . Transportation needs:    Medical: Not on file    Non-medical: Not on file  Tobacco Use  . Smoking status: Current Every Day  Smoker    Packs/day: 0.25    Years: 50.00    Pack years: 12.50    Types: Cigarettes  . Smokeless tobacco: Never Used  Substance and Sexual Activity  . Alcohol use: No    Alcohol/week: 0.0 oz  . Drug use: No  . Sexual activity: Not Currently  Lifestyle  . Physical activity:    Days per week: Not on file    Minutes per session: Not on file  . Stress: Not on file  Relationships  . Social connections:    Talks on phone: Not on file    Gets together: Not on file    Attends religious service: Not on file    Active member of club or organization: Not on file    Attends meetings of clubs or organizations: Not on file    Relationship status: Not on file  . Intimate partner violence:    Fear of current or ex partner: Not on file    Emotionally abused: Not on file    Physically abused: Not on file    Forced sexual activity: Not on file  Other Topics Concern  . Not on file  Social History Narrative   Church activities, [employer]ies    Hospitiliaztions: None  Health Maintenance:     Flu: never  Tetanus: 6/ 2011  Pneumovax: 03/2014  Prevnar: 03/2015  Zostavax: 12/2010  Shingrix: never  Mammogram: 03/2017  Pap Smear: hysterectomy  Bone Density: 2004  Colon Screening: 2012, every 10 years  Eye Doctor: annually, [facility]  Dental Exam: as needed   Providers:   PCP: Nicki Reaper, NP-C  Podiatry: Dr. Stacie Acres  Pulmonologist: Dr. Nicholos Johns   I have personally reviewed and have noted:  1. The patient's medical and social history 2. Their use of alcohol, tobacco or illicit drugs 3. Their current medications and supplements 4. The patient's functional ability including ADL's, fall risks, home safety risks and hearing or visual impairment. 5. Diet and physical activities 6. Evidence for depression or mood disorder  Subjective:   Review of Systems:   Constitutional: Denies fever, malaise, fatigue, headache or abrupt weight changes.  HEENT: Denies eye pain, eye redness,  ear pain, ringing in the ears, wax buildup, runny nose, nasal congestion, bloody nose, or sore throat. Respiratory: Denies difficulty breathing, shortness of breath, cough or sputum production.   Cardiovascular: Pt reports swelling in legs. Denies chest pain, chest tightness, palpitations or swelling in the hands.  Gastrointestinal: Denies abdominal pain, bloating, constipation, diarrhea or blood in the stool.  GU: Denies urgency, frequency, pain with urination, burning sensation, blood in urine, odor or discharge. Musculoskeletal: Pt reports intermittent knee pain, left foot  pain. Denies decrease in range of motion, difficulty with gait, muscle pain or joint swelling.  Skin: Denies redness, rashes, lesions or ulcercations.  Neurological: Pt reports numbness and tingling in right hand. Denies dizziness, difficulty with memory, difficulty with speech or problems with balance and coordination.  Psych: Denies anxiety, depression, SI/HI.  No other specific complaints in a complete review of systems (except as listed in HPI above).  Objective:  PE:   BP 124/80   Pulse 71   Temp 98.2 F (36.8 C) (Oral)   Ht 5' 4.8" (1.646 m)   Wt 253 lb (114.8 kg)   SpO2 96%   BMI 42.36 kg/m   Wt Readings from Last 3 Encounters:  11/21/17 255 lb 8 oz (115.9 kg)  07/25/17 261 lb 4 oz (118.5 kg)  06/16/17 256 lb 8 oz (116.3 kg)    General: Appears her stated age, obese, in NAD. Skin: Warm, dry and intact. No ulcerations noted. HEENT: Head: normal shape and size; Eyes: sclera white, no icterus, conjunctiva pink, PERRLA and EOMs intact; Ears: Tm's gray and intact, normal light reflex; Throat/Mouth: Teeth present, mucosa pink and moist, no exudate, lesions or ulcerations noted.  Neck: Neck supple, trachea midline. No masses, lumps or thyromegaly present.  Cardiovascular: Normal rate and rhythm. S1,S2 noted.  No murmur, rubs or gallops noted. 1+  BLE edema. No carotid bruits noted. Pulmonary/Chest: Normal  effort and positive vesicular breath sounds. No respiratory distress. No wheezes, rales or ronchi noted.  Abdomen: Soft and nontender. Normal bowel sounds. No distention or masses noted. Liver, spleen and kidneys non palpable. Musculoskeletal:  Strength 5/5 BUE/BLE. No difficulty with gait.  Neurological: Alert and oriented. Cranial nerves II-XII grossly intact. Coordination normal.  Psychiatric: Mood and affect normal. Behavior is normal. Judgment and thought content normal.    BMET    Component Value Date/Time   NA 141 02/04/2017 0920   K 3.7 02/04/2017 0920   CL 104 02/04/2017 0920   CO2 31 02/04/2017 0920   GLUCOSE 102 (H) 02/04/2017 0920   BUN 11 02/04/2017 0920   CREATININE 0.84 02/04/2017 0920   CALCIUM 9.7 02/04/2017 0920   GFRNONAA >60 10/07/2016 1040   GFRAA >60 10/07/2016 1040    Lipid Panel     Component Value Date/Time   CHOL 133 02/04/2017 0920   TRIG 99.0 02/04/2017 0920   HDL 41.10 02/04/2017 0920   CHOLHDL 3 02/04/2017 0920   VLDL 19.8 02/04/2017 0920   LDLCALC 72 02/04/2017 0920    CBC    Component Value Date/Time   WBC 9.1 02/04/2017 0920   RBC 4.84 02/04/2017 0920   HGB 14.3 02/04/2017 0920   HCT 42.4 02/04/2017 0920   PLT 272.0 02/04/2017 0920   MCV 87.7 02/04/2017 0920   MCH 30.3 10/08/2016 0425   MCHC 33.8 02/04/2017 0920   RDW 14.3 02/04/2017 0920   LYMPHSABS 3.4 02/07/2015 1131   MONOABS 0.8 02/07/2015 1131   EOSABS 0.4 02/07/2015 1131   BASOSABS 0.0 02/07/2015 1131    Hgb A1C Lab Results  Component Value Date   HGBA1C 6.4 02/04/2017      Assessment and Plan:   Medicare Annual Wellness Visit:  Diet: She does eat meat. She consumes fruits and veggies daily. She tries to avoid fried foods. She drinks mostly coffee, water. Physical activity: Exercise at gym 3 days per week, silver sneakers Depression/mood screen: Negative Hearing: Intact to whispered voice Visual acuity: Grossly normal, performs annual eye exam  ADLs:  Capable  Fall risk: None Home safety: Good Cognitive evaluation: Intact to orientation, naming, recall and repetition EOL planning: Adv directives, full code/ I agree  Preventative Medicine: Encouraged her to get a flu shot in the fall. Tetanus, pneumovax, prevnar and zostovax UTD. RX for shingrix provided. She will call to schedule her mammogram and bone density. She no longer needs pap smears. Colon screening UTD. Encouraged her to consume a balanced diet and exercise regimen. Advised her to see an eye doctor and dentist annually. Will check CBC, CMET, Lipid, A1C and Vit D today.  Carpal Tunnel, Right:   Having surgery on Monday  Left Foot Pain:  She will continue to follow with Dr. Stacie AcresMayer.  Next appointment:  1 year, Medicare Wellness Exam   Nicki Reaperegina Gable Odonohue, NP

## 2018-02-06 NOTE — Assessment & Plan Note (Signed)
A1C today  I forgot to order microalbumin Encouraged her to consume a low carb diet and exercise for weight loss Continue Metformin, will adjust if needed based on labs Foot exam today Continue yearly eye exams Immunizations UTD

## 2018-02-06 NOTE — Assessment & Plan Note (Signed)
Controlled on Metoprolol, HCTZ and Potassium CBC and CMET today Consider switching HCTZ to Lasix given persistent LE edema Reinforced DASH diet and exercise for weight loss

## 2018-02-06 NOTE — Assessment & Plan Note (Signed)
CMET and Lipid profile today Encouraged her to consume a low fat diet Continue Rosuvastatin as prescribed

## 2018-02-06 NOTE — Patient Instructions (Signed)
Health Maintenance for Postmenopausal Women Menopause is a normal process in which your reproductive ability comes to an end. This process happens gradually over a span of months to years, usually between the ages of 22 and 9. Menopause is complete when you have missed 12 consecutive menstrual periods. It is important to talk with your health care provider about some of the most common conditions that affect postmenopausal women, such as heart disease, cancer, and bone loss (osteoporosis). Adopting a healthy lifestyle and getting preventive care can help to promote your health and wellness. Those actions can also lower your chances of developing some of these common conditions. What should I know about menopause? During menopause, you may experience a number of symptoms, such as:  Moderate-to-severe hot flashes.  Night sweats.  Decrease in sex drive.  Mood swings.  Headaches.  Tiredness.  Irritability.  Memory problems.  Insomnia.  Choosing to treat or not to treat menopausal changes is an individual decision that you make with your health care provider. What should I know about hormone replacement therapy and supplements? Hormone therapy products are effective for treating symptoms that are associated with menopause, such as hot flashes and night sweats. Hormone replacement carries certain risks, especially as you become older. If you are thinking about using estrogen or estrogen with progestin treatments, discuss the benefits and risks with your health care provider. What should I know about heart disease and stroke? Heart disease, heart attack, and stroke become more likely as you age. This may be due, in part, to the hormonal changes that your body experiences during menopause. These can affect how your body processes dietary fats, triglycerides, and cholesterol. Heart attack and stroke are both medical emergencies. There are many things that you can do to help prevent heart disease  and stroke:  Have your blood pressure checked at least every 1-2 years. High blood pressure causes heart disease and increases the risk of stroke.  If you are 53-22 years old, ask your health care provider if you should take aspirin to prevent a heart attack or a stroke.  Do not use any tobacco products, including cigarettes, chewing tobacco, or electronic cigarettes. If you need help quitting, ask your health care provider.  It is important to eat a healthy diet and maintain a healthy weight. ? Be sure to include plenty of vegetables, fruits, low-fat dairy products, and lean protein. ? Avoid eating foods that are high in solid fats, added sugars, or salt (sodium).  Get regular exercise. This is one of the most important things that you can do for your health. ? Try to exercise for at least 150 minutes each week. The type of exercise that you do should increase your heart rate and make you sweat. This is known as moderate-intensity exercise. ? Try to do strengthening exercises at least twice each week. Do these in addition to the moderate-intensity exercise.  Know your numbers.Ask your health care provider to check your cholesterol and your blood glucose. Continue to have your blood tested as directed by your health care provider.  What should I know about cancer screening? There are several types of cancer. Take the following steps to reduce your risk and to catch any cancer development as early as possible. Breast Cancer  Practice breast self-awareness. ? This means understanding how your breasts normally appear and feel. ? It also means doing regular breast self-exams. Let your health care provider know about any changes, no matter how small.  If you are 40  or older, have a clinician do a breast exam (clinical breast exam or CBE) every year. Depending on your age, family history, and medical history, it may be recommended that you also have a yearly breast X-ray (mammogram).  If you  have a family history of breast cancer, talk with your health care provider about genetic screening.  If you are at high risk for breast cancer, talk with your health care provider about having an MRI and a mammogram every year.  Breast cancer (BRCA) gene test is recommended for women who have family members with BRCA-related cancers. Results of the assessment will determine the need for genetic counseling and BRCA1 and for BRCA2 testing. BRCA-related cancers include these types: ? Breast. This occurs in males or females. ? Ovarian. ? Tubal. This may also be called fallopian tube cancer. ? Cancer of the abdominal or pelvic lining (peritoneal cancer). ? Prostate. ? Pancreatic.  Cervical, Uterine, and Ovarian Cancer Your health care provider may recommend that you be screened regularly for cancer of the pelvic organs. These include your ovaries, uterus, and vagina. This screening involves a pelvic exam, which includes checking for microscopic changes to the surface of your cervix (Pap test).  For women ages 21-65, health care providers may recommend a pelvic exam and a Pap test every three years. For women ages 79-65, they may recommend the Pap test and pelvic exam, combined with testing for human papilloma virus (HPV), every five years. Some types of HPV increase your risk of cervical cancer. Testing for HPV may also be done on women of any age who have unclear Pap test results.  Other health care providers may not recommend any screening for nonpregnant women who are considered low risk for pelvic cancer and have no symptoms. Ask your health care provider if a screening pelvic exam is right for you.  If you have had past treatment for cervical cancer or a condition that could lead to cancer, you need Pap tests and screening for cancer for at least 20 years after your treatment. If Pap tests have been discontinued for you, your risk factors (such as having a new sexual partner) need to be  reassessed to determine if you should start having screenings again. Some women have medical problems that increase the chance of getting cervical cancer. In these cases, your health care provider may recommend that you have screening and Pap tests more often.  If you have a family history of uterine cancer or ovarian cancer, talk with your health care provider about genetic screening.  If you have vaginal bleeding after reaching menopause, tell your health care provider.  There are currently no reliable tests available to screen for ovarian cancer.  Lung Cancer Lung cancer screening is recommended for adults 69-62 years old who are at high risk for lung cancer because of a history of smoking. A yearly low-dose CT scan of the lungs is recommended if you:  Currently smoke.  Have a history of at least 30 pack-years of smoking and you currently smoke or have quit within the past 15 years. A pack-year is smoking an average of one pack of cigarettes per day for one year.  Yearly screening should:  Continue until it has been 15 years since you quit.  Stop if you develop a health problem that would prevent you from having lung cancer treatment.  Colorectal Cancer  This type of cancer can be detected and can often be prevented.  Routine colorectal cancer screening usually begins at  age 42 and continues through age 45.  If you have risk factors for colon cancer, your health care provider may recommend that you be screened at an earlier age.  If you have a family history of colorectal cancer, talk with your health care provider about genetic screening.  Your health care provider may also recommend using home test kits to check for hidden blood in your stool.  A small camera at the end of a tube can be used to examine your colon directly (sigmoidoscopy or colonoscopy). This is done to check for the earliest forms of colorectal cancer.  Direct examination of the colon should be repeated every  5-10 years until age 71. However, if early forms of precancerous polyps or small growths are found or if you have a family history or genetic risk for colorectal cancer, you may need to be screened more often.  Skin Cancer  Check your skin from head to toe regularly.  Monitor any moles. Be sure to tell your health care provider: ? About any new moles or changes in moles, especially if there is a change in a mole's shape or color. ? If you have a mole that is larger than the size of a pencil eraser.  If any of your family members has a history of skin cancer, especially at a young age, talk with your health care provider about genetic screening.  Always use sunscreen. Apply sunscreen liberally and repeatedly throughout the day.  Whenever you are outside, protect yourself by wearing long sleeves, pants, a wide-brimmed hat, and sunglasses.  What should I know about osteoporosis? Osteoporosis is a condition in which bone destruction happens more quickly than new bone creation. After menopause, you may be at an increased risk for osteoporosis. To help prevent osteoporosis or the bone fractures that can happen because of osteoporosis, the following is recommended:  If you are 46-71 years old, get at least 1,000 mg of calcium and at least 600 mg of vitamin D per day.  If you are older than age 55 but younger than age 65, get at least 1,200 mg of calcium and at least 600 mg of vitamin D per day.  If you are older than age 54, get at least 1,200 mg of calcium and at least 800 mg of vitamin D per day.  Smoking and excessive alcohol intake increase the risk of osteoporosis. Eat foods that are rich in calcium and vitamin D, and do weight-bearing exercises several times each week as directed by your health care provider. What should I know about how menopause affects my mental health? Depression may occur at any age, but it is more common as you become older. Common symptoms of depression  include:  Low or sad mood.  Changes in sleep patterns.  Changes in appetite or eating patterns.  Feeling an overall lack of motivation or enjoyment of activities that you previously enjoyed.  Frequent crying spells.  Talk with your health care provider if you think that you are experiencing depression. What should I know about immunizations? It is important that you get and maintain your immunizations. These include:  Tetanus, diphtheria, and pertussis (Tdap) booster vaccine.  Influenza every year before the flu season begins.  Pneumonia vaccine.  Shingles vaccine.  Your health care provider may also recommend other immunizations. This information is not intended to replace advice given to you by your health care provider. Make sure you discuss any questions you have with your health care provider. Document Released: 08/30/2005  Document Revised: 01/26/2016 Document Reviewed: 04/11/2015 Elsevier Interactive Patient Education  2018 [employer]  

## 2018-02-07 LAB — CBC
HEMATOCRIT: 40.4 % (ref 35.0–45.0)
HEMOGLOBIN: 14.2 g/dL (ref 11.7–15.5)
MCH: 29.6 pg (ref 27.0–33.0)
MCHC: 35.1 g/dL (ref 32.0–36.0)
MCV: 84.3 fL (ref 80.0–100.0)
MPV: 12.2 fL (ref 7.5–12.5)
Platelets: 293 10*3/uL (ref 140–400)
RBC: 4.79 10*6/uL (ref 3.80–5.10)
RDW: 13.9 % (ref 11.0–15.0)
WBC: 11.6 10*3/uL — AB (ref 3.8–10.8)

## 2018-02-07 LAB — COMPREHENSIVE METABOLIC PANEL
AG RATIO: 1.4 (calc) (ref 1.0–2.5)
ALT: 18 U/L (ref 6–29)
AST: 20 U/L (ref 10–35)
Albumin: 4 g/dL (ref 3.6–5.1)
Alkaline phosphatase (APISO): 76 U/L (ref 33–130)
BILIRUBIN TOTAL: 0.4 mg/dL (ref 0.2–1.2)
BUN: 9 mg/dL (ref 7–25)
CALCIUM: 9.8 mg/dL (ref 8.6–10.4)
CHLORIDE: 104 mmol/L (ref 98–110)
CO2: 28 mmol/L (ref 20–32)
Creat: 0.91 mg/dL (ref 0.60–0.93)
GLOBULIN: 2.9 g/dL (ref 1.9–3.7)
GLUCOSE: 86 mg/dL (ref 65–99)
Potassium: 4.3 mmol/L (ref 3.5–5.3)
SODIUM: 140 mmol/L (ref 135–146)
TOTAL PROTEIN: 6.9 g/dL (ref 6.1–8.1)

## 2018-02-07 LAB — HEMOGLOBIN A1C
EAG (MMOL/L): 7.4 (calc)
Hgb A1c MFr Bld: 6.3 % of total Hgb — ABNORMAL HIGH (ref <5.7)
Mean Plasma Glucose: 134 (calc)

## 2018-02-07 LAB — LIPID PANEL
CHOL/HDL RATIO: 3.5 (calc) (ref <5.0)
Cholesterol: 146 mg/dL (ref <200)
HDL: 42 mg/dL — ABNORMAL LOW (ref >50)
LDL CHOLESTEROL (CALC): 79 mg/dL
NON-HDL CHOLESTEROL (CALC): 104 mg/dL (ref <130)
Triglycerides: 146 mg/dL (ref <150)

## 2018-02-07 LAB — VITAMIN D 25 HYDROXY (VIT D DEFICIENCY, FRACTURES): VIT D 25 HYDROXY: 32 ng/mL (ref 30–100)

## 2018-02-09 DIAGNOSIS — G5601 Carpal tunnel syndrome, right upper limb: Secondary | ICD-10-CM | POA: Diagnosis not present

## 2018-02-10 DIAGNOSIS — M65872 Other synovitis and tenosynovitis, left ankle and foot: Secondary | ICD-10-CM | POA: Diagnosis not present

## 2018-02-10 DIAGNOSIS — M25372 Other instability, left ankle: Secondary | ICD-10-CM | POA: Diagnosis not present

## 2018-02-10 DIAGNOSIS — M79672 Pain in left foot: Secondary | ICD-10-CM | POA: Diagnosis not present

## 2018-02-18 ENCOUNTER — Other Ambulatory Visit: Payer: Self-pay | Admitting: Internal Medicine

## 2018-02-19 MED ORDER — HYDROCHLOROTHIAZIDE 25 MG PO TABS: 25.0000 mg | ORAL_TABLET | Freq: Every day | ORAL | 2 refills | Status: DC

## 2018-02-19 MED ORDER — ESOMEPRAZOLE MAGNESIUM 40 MG PO CPDR: DELAYED_RELEASE_CAPSULE | ORAL | 2 refills | Status: DC

## 2018-02-19 MED ORDER — ROSUVASTATIN CALCIUM 10 MG PO TABS: 10.0000 mg | ORAL_TABLET | Freq: Every day | ORAL | 2 refills | Status: DC

## 2018-02-19 MED ORDER — METFORMIN HCL 500 MG PO TABS: 500.0000 mg | ORAL_TABLET | Freq: Two times a day (BID) | ORAL | 2 refills | Status: DC

## 2018-02-20 DIAGNOSIS — Z9889 Other specified postprocedural states: Secondary | ICD-10-CM | POA: Diagnosis not present

## 2018-02-25 ENCOUNTER — Other Ambulatory Visit: Payer: Self-pay | Admitting: Family Medicine

## 2018-02-26 NOTE — Telephone Encounter (Signed)
Please see rx request

## 2018-03-03 ENCOUNTER — Encounter: Payer: Self-pay | Admitting: Internal Medicine

## 2018-03-05 DIAGNOSIS — S99922A Unspecified injury of left foot, initial encounter: Secondary | ICD-10-CM | POA: Diagnosis not present

## 2018-03-17 DIAGNOSIS — S93692A Other sprain of left foot, initial encounter: Secondary | ICD-10-CM | POA: Diagnosis not present

## 2018-03-20 DIAGNOSIS — Z9889 Other specified postprocedural states: Secondary | ICD-10-CM | POA: Diagnosis not present

## 2018-03-26 ENCOUNTER — Ambulatory Visit
Admission: RE | Admit: 2018-03-26 | Discharge: 2018-03-26 | Disposition: A | Discharge status: Home / Self Care | Payer: Medicare HMO | Source: Ambulatory Visit | Attending: Internal Medicine | Admitting: Internal Medicine

## 2018-03-26 DIAGNOSIS — Z780 Asymptomatic menopausal state: Secondary | ICD-10-CM | POA: Insufficient documentation

## 2018-04-14 DIAGNOSIS — M79672 Pain in left foot: Secondary | ICD-10-CM | POA: Diagnosis not present

## 2018-04-14 DIAGNOSIS — S93692A Other sprain of left foot, initial encounter: Secondary | ICD-10-CM | POA: Diagnosis not present

## 2018-04-15 ENCOUNTER — Telehealth: Payer: Self-pay | Admitting: Internal Medicine

## 2018-04-15 DIAGNOSIS — F339 Major depressive disorder, recurrent, unspecified: Secondary | ICD-10-CM

## 2018-04-15 NOTE — Telephone Encounter (Signed)
Pt would like referral to see Saramma Eappen at [facility] for counseling in reference to depression as she had a counselor when [place] was in [place] and would like to restart.Marland Kitchen..Marland Kitchen

## 2018-04-15 NOTE — Telephone Encounter (Signed)
Patient called to schedule with behavioral health but was told she needs a referral from PCP. Please advise

## 2018-04-16 NOTE — Telephone Encounter (Signed)
Referral placed.

## 2018-04-16 NOTE — Addendum Note (Signed)
Addended by: Lorre Munroe on: 04/16/2018 08:01 AM   Modules accepted: Orders

## 2018-04-22 ENCOUNTER — Telehealth: Payer: Self-pay | Admitting: Internal Medicine

## 2018-04-22 DIAGNOSIS — G4733 Obstructive sleep apnea (adult) (pediatric): Secondary | ICD-10-CM | POA: Diagnosis not present

## 2018-04-28 ENCOUNTER — Other Ambulatory Visit: Payer: Self-pay | Admitting: Internal Medicine

## 2018-04-28 DIAGNOSIS — Z1231 Encounter for screening mammogram for malignant neoplasm of breast: Secondary | ICD-10-CM

## 2018-04-28 NOTE — Progress Notes (Signed)
[facility]  Pulmonary Medicine     Assessment and Plan:  COPD. --Stable. Asked to continue 2 puffs of albuterol before exercise.  - Continue activity as tolerated. - Declined flu vaccine. - Pneumococcal 13 03/27/2015, pneumococcal 23 04/06/2014  OSA on CPAP. -Doing well with CPAP. --Continue Auto-CPAP with pressure range 10-16.   Nicotine abuse. --Discussed the the importance of smoke cessation, spent  4 minutes medicine discussion. - She developed significant anxiety/nervousness with Chantix. - She is given the [place] quit line today and encouraged to call it. - She is referred for lung cancer screening today.  Obesity. --weight loss would be helpful for her breathing.  --Continue exercise classes.    Return in about 1 year (around 04/30/2019).   Date: 04/28/2018  MRN# [id number] Dazia Lippold 02/04/1946   Zerline Melchior is a 72 y.o. old female seen in follow up for chief complaint of  Chief Complaint  Patient presents with  . Sleep Apnea    pt compliant she uses cpap nightly 4 hrs or greater  . COPD    stable... patient exercised three times weekly.     HPI:   The patient is a 72 year old female she has OSA and type A COPD, well controlled with when necessary rescue inhaler.  At last visit she had dyspnea on exertion with exercise.  She was asked to use albuterol, 2 puffs before exercise.  She is asked to continue her auto CPAP with pressure range 10-16, and she was prescribed Chantix starter pack to assist in smoking cessation.  Today she feels that her breathing has been doing well, she goes to exercise class 3 days per week, she is not limited by breathing. In the past month she has not had to use her inhaler.  She has continued to smoking about a pack every 3 days, she has been smoking for over 50 years. She tried chantix but as she increased the dose it made her very nervous.  She is doing well with CPAP, using it every night and is not sleepy during the  day.   **Download dated 03/29/2018-04/27/2018>> raw data personally reviewed, usage greater than 4 hours is 28/30 days.  Average usage on days used is 6 hours 21 minutes.  Pressure ranges 10-16.  Median pressure 12, 95th percentile pressure 15, maximum pressure 16.  Residual apnea index is 1.  Shows good compliance with CPAP with excellent control of obstructive sleep apnea.  **Download data on/1-9/30/18. Average usage on days used was 5/33 minutes, uses greater than 4 hours a 63%, set 10-16. 95th percentile pressure is 14.4, maximum pressure is 15. Wean pressors 11.7. Residual AHI is 1.4. **Sleep study 03/14/15; AHI of 21; 25 when supine.   Medication:    Current Outpatient Medications:  .  albuterol (PROVENTIL HFA;VENTOLIN HFA) 108 (90 Base) MCG/ACT inhaler, Inhale 2 puffs into the lungs every 6 (six) hours as needed for wheezing or shortness of breath., Disp: 6.7 Inhaler, Rfl: 3 .  Cholecalciferol (VITAMIN D-3) 1000 UNITS CAPS, Take 1 capsule by mouth daily., Disp: , Rfl:  .  esomeprazole (NEXIUM) 40 MG capsule, TAKE 1 CAPSULE BY MOUTH  DAILY AT 12 NOON, Disp: 90 capsule, Rfl: 2 .  hydrochlorothiazide (HYDRODIURIL) 25 MG tablet, Take 1 tablet (25 mg total) by mouth daily., Disp: 90 tablet, Rfl: 2 .  meclizine (ANTIVERT) 25 MG tablet, Take 1 tablet (25 mg total) by mouth 3 (three) times daily as needed for dizziness., Disp: 90 tablet, Rfl: 0 .  metFORMIN (  GLUCOPHAGE) 500 MG tablet, Take 1 tablet (500 mg total) by mouth 2 (two) times daily with a meal., Disp: 180 tablet, Rfl: 2 .  metoprolol succinate (TOPROL-XL) 100 MG 24 hr tablet, TAKE 1 TABLET BY MOUTH  DAILY WITH OR IMMEDIATELY  FOLLOWING A MEAL, Disp: 90 tablet, Rfl: 3 .  potassium chloride SA (K-DUR,KLOR-CON) 20 MEQ tablet, TAKE 1 TABLET BY MOUTH  DAILY, Disp: 90 tablet, Rfl: 2 .  rosuvastatin (CRESTOR) 10 MG tablet, Take 1 tablet (10 mg total) by mouth daily., Disp: 90 tablet, Rfl: 2 .  Spacer/Aero Chamber Mouthpiece MISC, 1 Units by Does not  apply route as needed., Disp: 1 each, Rfl: 0 .  Spacer/Aero-Holding Chambers (OPTICHAMBER DIAMOND-LG MASK) DEVI, See admin instructions. use with inhaler, Disp: , Rfl: 0   Allergies:  Aspirin; Darvon [propoxyphene]; Latex; and Naproxen  Review of Systems:  Constitutional: Feels well. Cardiovascular: Denies chest pain, exertional chest pain.  Pulmonary: Denies hemoptysis, pleuritic chest pain.   The remainder of systems were reviewed and were found to be negative other than what is documented in the HPI.    Physical Examination:   VS: BP 118/80 (BP Location: Left Arm, Cuff Size: Large)   Pulse 79   Resp 16   Ht 5' 4.8" (1.646 m)   Wt 253 lb (114.8 kg)   SpO2 99%   BMI 42.36 kg/m   General Appearance: No distress  Neuro:without focal findings, mental status, speech normal, alert and oriented HEENT: PERRLA, EOM intact Pulmonary: No wheezing, No rales  CardiovascularNormal S1,S2.  No m/r/g.  Abdomen: Benign, Soft, non-tender, No masses Renal:  No costovertebral tenderness  GU:  No performed at this time. Endoc: No evident thyromegaly, no signs of acromegaly or Cushing features Skin:   warm, no rashes, no ecchymosis  Extremities: normal, no cyanosis, clubbing.     LABORATORY PANEL:   CBC No results for input(s): WBC, HGB, HCT, PLT in the last 168 hours. ------------------------------------------------------------------------------------------------------------------  Chemistries  No results for input(s): NA, K, CL, CO2, GLUCOSE, BUN, CREATININE, CALCIUM, MG, AST, ALT, ALKPHOS, BILITOT in the last 168 hours.  Invalid input(s): GFRCGP ------------------------------------------------------------------------------------------------------------------  Cardiac Enzymes No results for input(s): TROPONINI in the last 168 hours. ------------------------------------------------------------  RADIOLOGY:    Results for orders placed during the hospital encounter of 09/12/14    DG Chest 2 View   Narrative CLINICAL DATA:  Dyspnea on exertion, history of smoking  EXAM: CHEST  2 VIEW  COMPARISON:  None.  FINDINGS: Cardiac shadow is within normal limits. The lungs are well aerated bilaterally. Mild interstitial changes are seen without focal infiltrate. No effusion or pneumothorax is noted.  IMPRESSION: No active cardiopulmonary disease.   Electronically Signed   By: Alcide Clever M.D.   On: 09/12/2014 12:39    ------------------------------------------------------------------------------------------------------------------  Thank  you for allowing [facility] to assist in the care of your patient. Our recommendations are noted above.  Please contact us if we can be of further service.   Wells Guiles, MD.  [facility] Office Number: [phone]  Santiago Glad, M.D.  Billy Fischer, M.D  04/28/2018

## 2018-04-29 ENCOUNTER — Ambulatory Visit: Payer: Medicare HMO | Admitting: Internal Medicine

## 2018-04-29 ENCOUNTER — Telehealth: Payer: Self-pay | Admitting: *Deleted

## 2018-04-29 ENCOUNTER — Encounter: Payer: Self-pay | Admitting: Internal Medicine

## 2018-04-29 VITALS — BP 118/80 | HR 79 | Resp 16 | Ht 64.8 in | Wt 253.0 lb

## 2018-04-29 DIAGNOSIS — Z720 Tobacco use: Secondary | ICD-10-CM

## 2018-04-29 DIAGNOSIS — F17210 Nicotine dependence, cigarettes, uncomplicated: Secondary | ICD-10-CM | POA: Diagnosis not present

## 2018-04-29 DIAGNOSIS — G4733 Obstructive sleep apnea (adult) (pediatric): Secondary | ICD-10-CM

## 2018-04-29 DIAGNOSIS — J438 Other emphysema: Secondary | ICD-10-CM | POA: Diagnosis not present

## 2018-04-29 DIAGNOSIS — R69 Illness, unspecified: Secondary | ICD-10-CM | POA: Diagnosis not present

## 2018-04-29 NOTE — Patient Instructions (Addendum)
Will refer you to lung cancer screening.   --Quitting smoking is the most important thing that you can do for your health.  --Quitting smoking will have greater affect on your health than any medicine that we can give you.

## 2018-04-29 NOTE — Telephone Encounter (Signed)
Received referral for low dose lung cancer screening CT scan.  Message left at phone number listed in EMR for patient to call either myself or Shawn Perkins back at [phone] to facilitate scheduling the scan.    

## 2018-05-01 ENCOUNTER — Telehealth: Payer: Self-pay | Admitting: *Deleted

## 2018-05-01 ENCOUNTER — Encounter: Payer: Self-pay | Admitting: *Deleted

## 2018-05-01 DIAGNOSIS — Z122 Encounter for screening for malignant neoplasm of respiratory organs: Secondary | ICD-10-CM

## 2018-05-01 NOTE — Telephone Encounter (Signed)
Received a referral for initial lung cancer screening scan.  Contacted the patient and obtained their smoking history, current smoker of 1/3-1/2 ppd with 49pkyr history  as well as answering questions related to screening process.  Patient denies signs of lung cancer such as weight loss or hemoptysis at this time.  Patient denies comorbidity that would prevent curative treatment if lung cancer were found.  Patient is scheduled for the Shared Decision Making Visit and CT scan on (05-28-18@1330  .

## 2018-05-14 ENCOUNTER — Encounter: Payer: Self-pay | Admitting: Psychiatry

## 2018-05-14 ENCOUNTER — Ambulatory Visit: Payer: Medicare HMO | Admitting: Psychiatry

## 2018-05-14 ENCOUNTER — Ambulatory Visit (INDEPENDENT_AMBULATORY_CARE_PROVIDER_SITE_OTHER): Payer: Medicare HMO

## 2018-05-14 ENCOUNTER — Other Ambulatory Visit: Payer: Self-pay

## 2018-05-14 VITALS — BP 144/77 | HR 76 | Temp 98.0°F | Wt 253.4 lb

## 2018-05-14 DIAGNOSIS — R69 Illness, unspecified: Secondary | ICD-10-CM | POA: Diagnosis not present

## 2018-05-14 DIAGNOSIS — F17200 Nicotine dependence, unspecified, uncomplicated: Secondary | ICD-10-CM

## 2018-05-14 DIAGNOSIS — F331 Major depressive disorder, recurrent, moderate: Secondary | ICD-10-CM | POA: Diagnosis not present

## 2018-05-14 DIAGNOSIS — Z23 Encounter for immunization: Secondary | ICD-10-CM | POA: Diagnosis not present

## 2018-05-14 MED ORDER — BUPROPION HCL 75 MG PO TABS: 75.0000 mg | ORAL_TABLET | Freq: Every morning | ORAL | 0 refills | Status: DC

## 2018-05-14 NOTE — Patient Instructions (Signed)
       Bupropion tablets (Depression/Mood Disorders) What is this medicine? BUPROPION (byoo PROE pee on) is used to treat depression. This medicine may be used for other purposes; ask your health care provider or pharmacist if you have questions. COMMON BRAND NAME(S): Wellbutrin What should I tell my health care provider before I take this medicine? They need to know if you have any of these conditions: -an eating disorder, such as anorexia or bulimia -bipolar disorder or psychosis -diabetes or high blood sugar, treated with medication -glaucoma -heart disease, previous heart attack, or irregular heart beat -head injury or brain tumor -high blood pressure -kidney or liver disease -seizures -suicidal thoughts or a previous suicide attempt -Tourette's syndrome -weight loss -an unusual or allergic reaction to bupropion, other medicines, foods, dyes, or preservatives -breast-feeding -pregnant or trying to become pregnant How should I use this medicine? Take this medicine by mouth with a glass of water. Follow the directions on the prescription label. You can take it with or without food. If it upsets your stomach, take it with food. Take your medicine at regular intervals. Do not take your medicine more often than directed. Do not stop taking this medicine suddenly except upon the advice of your doctor. Stopping this medicine too quickly may cause serious side effects or your condition may worsen. A special MedGuide will be given to you by the pharmacist with each prescription and refill. Be sure to read this information carefully each time. Talk to your pediatrician regarding the use of this medicine in children. Special care may be needed. Overdosage: If you think you have taken too much of this medicine contact a poison control center or emergency room at once. NOTE: This medicine is only for you. Do not share this medicine with others. What if I miss a dose? If you miss a dose,  take it as soon as you can. If it is less than four hours to your next dose, take only that dose and skip the missed dose. Do not take double or extra doses. What may interact with this medicine? Do not take this medicine with any of the following medications: -linezolid -MAOIs like Azilect, Carbex, Eldepryl, Marplan, Nardil, and Parnate -methylene blue (injected into a vein) -other medicines that contain bupropion like Zyban This medicine may also interact with the following medications: -alcohol -certain medicines for anxiety or sleep -certain medicines for blood pressure like metoprolol, propranolol -certain medicines for depression or psychotic disturbances -certain medicines for HIV or AIDS like efavirenz, lopinavir, nelfinavir, ritonavir -certain medicines for irregular heart beat like propafenone, flecainide -certain medicines for Parkinson's disease like amantadine, levodopa -certain medicines for seizures like carbamazepine, phenytoin, phenobarbital -cimetidine -clopidogrel -cyclophosphamide -digoxin -furazolidone -isoniazid -nicotine -orphenadrine -procarbazine -steroid medicines like prednisone or cortisone -stimulant medicines for attention disorders, weight loss, or to stay awake -tamoxifen -theophylline -thiotepa -ticlopidine -tramadol -warfarin This list may not describe all possible interactions. Give your health care provider a list of all the medicines, herbs, non-prescription drugs, or dietary supplements you use. Also tell them if you smoke, drink alcohol, or use illegal drugs. Some items may interact with your medicine. What should I watch for while using this medicine? Tell your doctor if your symptoms do not get better or if they get worse. Visit your doctor or health care professional for regular checks on your progress. Because it may take several weeks to see the full effects of this medicine, it is important to continue your treatment as prescribed by    your doctor. Patients and their families should watch out for new or worsening thoughts of suicide or depression. Also watch out for sudden changes in feelings such as feeling anxious, agitated, panicky, irritable, hostile, aggressive, impulsive, severely restless, overly excited and hyperactive, or not being able to sleep. If this happens, especially at the beginning of treatment or after a change in dose, call your health care professional. Avoid alcoholic drinks while taking this medicine. Drinking excessive alcoholic beverages, using sleeping or anxiety medicines, or quickly stopping the use of these agents while taking this medicine may increase your risk for a seizure. Do not drive or use heavy machinery until you know how this medicine affects you. This medicine can impair your ability to perform these tasks. Do not take this medicine close to bedtime. It may prevent you from sleeping. Your mouth may get dry. Chewing sugarless gum or sucking hard candy, and drinking plenty of water may help. Contact your doctor if the problem does not go away or is severe. What side effects may I notice from receiving this medicine? Side effects that you should report to your doctor or health care professional as soon as possible: -allergic reactions like skin rash, itching or hives, swelling of the face, lips, or tongue -breathing problems -changes in vision -confusion -elevated mood, decreased need for sleep, racing thoughts, impulsive behavior -fast or irregular heartbeat -hallucinations, loss of contact with reality -increased blood pressure -redness, blistering, peeling or loosening of the skin, including inside the mouth -seizures -suicidal thoughts or other mood changes -unusually weak or tired -vomiting Side effects that usually do not require medical attention (report to your doctor or health care professional if they continue or are bothersome): -constipation -headache -loss of  appetite -nausea -tremors -weight loss This list may not describe all possible side effects. Call your doctor for medical advice about side effects. You may report side effects to FDA at 1-800-FDA-1088. Where should I keep my medicine? Keep out of the reach of children. Store at room temperature between 20 and 25 degrees C (68 and 77 degrees F), away from direct sunlight and moisture. Keep tightly closed. Throw away any unused medicine after the expiration date. NOTE: This sheet is a summary. It may not cover all possible information. If you have questions about this medicine, talk to your doctor, pharmacist, or health care provider.  2018 [employer]vier/Gold Standard (2015-12-29 13:44:21)  
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
## 2018-05-20 ENCOUNTER — Other Ambulatory Visit: Payer: Self-pay | Admitting: Internal Medicine

## 2018-05-20 ENCOUNTER — Ambulatory Visit
Admission: RE | Admit: 2018-05-20 | Discharge: 2018-05-20 | Disposition: A | Discharge status: Home / Self Care | Payer: Medicare HMO | Source: Ambulatory Visit | Attending: Internal Medicine | Admitting: Internal Medicine

## 2018-05-20 DIAGNOSIS — Z1231 Encounter for screening mammogram for malignant neoplasm of breast: Secondary | ICD-10-CM | POA: Insufficient documentation

## 2018-05-20 DIAGNOSIS — R928 Other abnormal and inconclusive findings on diagnostic imaging of breast: Secondary | ICD-10-CM

## 2018-05-20 DIAGNOSIS — N6310 Unspecified lump in the right breast, unspecified quadrant: Secondary | ICD-10-CM

## 2018-05-20 DIAGNOSIS — N6320 Unspecified lump in the left breast, unspecified quadrant: Secondary | ICD-10-CM

## 2018-05-21 ENCOUNTER — Ambulatory Visit: Payer: Medicare HMO | Admitting: Licensed Clinical Social Worker

## 2018-05-21 DIAGNOSIS — R69 Illness, unspecified: Secondary | ICD-10-CM | POA: Diagnosis not present

## 2018-05-21 DIAGNOSIS — F331 Major depressive disorder, recurrent, moderate: Secondary | ICD-10-CM
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
## 2018-05-28 ENCOUNTER — Inpatient Hospital Stay: Payer: Medicare HMO | Attending: Oncology | Admitting: Nurse Practitioner

## 2018-05-28 ENCOUNTER — Ambulatory Visit
Admission: RE | Admit: 2018-05-28 | Discharge: 2018-05-28 | Disposition: A | Discharge status: Home / Self Care | Payer: Medicare HMO | Source: Ambulatory Visit | Attending: Nurse Practitioner | Admitting: Nurse Practitioner

## 2018-05-28 ENCOUNTER — Inpatient Hospital Stay: Admission: RE | Admit: 2018-05-28 | Payer: Medicare HMO | Source: Ambulatory Visit

## 2018-05-28 DIAGNOSIS — J439 Emphysema, unspecified: Secondary | ICD-10-CM | POA: Insufficient documentation

## 2018-05-28 DIAGNOSIS — Z87891 Personal history of nicotine dependence: Secondary | ICD-10-CM | POA: Diagnosis not present

## 2018-05-28 DIAGNOSIS — I2510 Atherosclerotic heart disease of native coronary artery without angina pectoris: Secondary | ICD-10-CM | POA: Insufficient documentation

## 2018-05-28 DIAGNOSIS — I700 Atherosclerosis of aorta: Secondary | ICD-10-CM | POA: Insufficient documentation

## 2018-05-28 DIAGNOSIS — Z122 Encounter for screening for malignant neoplasm of respiratory organs: Secondary | ICD-10-CM | POA: Insufficient documentation

## 2018-05-28 DIAGNOSIS — R69 Illness, unspecified: Secondary | ICD-10-CM | POA: Diagnosis not present

## 2018-05-28 DIAGNOSIS — K449 Diaphragmatic hernia without obstruction or gangrene: Secondary | ICD-10-CM | POA: Insufficient documentation

## 2018-05-28 NOTE — Progress Notes (Signed)
In accordance with CMS guidelines, patient has met eligibility criteria including age, absence of signs or symptoms of lung cancer.  Social History   Tobacco Use  . Smoking status: Current Every Day Smoker    Packs/day: 1.00    Years: 49.00    Pack years: 49.00    Types: Cigarettes  . Smokeless tobacco: Never Used  Substance Use Topics  . Alcohol use: No    Alcohol/week: 0.0 standard drinks  . Drug use: No      A shared decision-making session was conducted prior to the performance of CT scan. This includes one or more decision aids, includes benefits and harms of screening, follow-up diagnostic testing, over-diagnosis, false positive rate, and total radiation exposure.   Counseling on the importance of adherence to annual lung cancer LDCT screening, impact of co-morbidities, and ability or willingness to undergo diagnosis and treatment is imperative for compliance of the program.   Counseling on the importance of continued smoking cessation for former smokers; the importance of smoking cessation for current smokers, and information about tobacco cessation interventions have been given to patient including [place] and 1800 quit [place] programs.   Written order for lung cancer screening with LDCT has been given to the patient and any and all questions have been answered to the best of my abilities.    Yearly follow up will be coordinated by Burgess Estelle, Thoracic Navigator.  Beckey Rutter, DNP, AGNP-C [facility] [phone] (work cell) [phone] (office) 05/28/18 1:39 PM

## 2018-06-01 ENCOUNTER — Ambulatory Visit
Admission: RE | Admit: 2018-06-01 | Discharge: 2018-06-01 | Disposition: A | Discharge status: Home / Self Care | Payer: Medicare HMO | Source: Ambulatory Visit | Attending: Internal Medicine | Admitting: Internal Medicine

## 2018-06-01 DIAGNOSIS — N6489 Other specified disorders of breast: Secondary | ICD-10-CM | POA: Diagnosis not present

## 2018-06-01 DIAGNOSIS — N6310 Unspecified lump in the right breast, unspecified quadrant: Secondary | ICD-10-CM | POA: Diagnosis present

## 2018-06-01 DIAGNOSIS — N6320 Unspecified lump in the left breast, unspecified quadrant: Secondary | ICD-10-CM

## 2018-06-01 DIAGNOSIS — R928 Other abnormal and inconclusive findings on diagnostic imaging of breast: Secondary | ICD-10-CM

## 2018-06-01 DIAGNOSIS — N6002 Solitary cyst of left breast: Secondary | ICD-10-CM | POA: Diagnosis not present

## 2018-06-02 ENCOUNTER — Encounter: Payer: Self-pay | Admitting: *Deleted

## 2018-06-04 ENCOUNTER — Other Ambulatory Visit: Payer: Self-pay

## 2018-06-04 ENCOUNTER — Ambulatory Visit: Payer: Medicare HMO | Admitting: Psychiatry

## 2018-06-04 ENCOUNTER — Encounter: Payer: Self-pay | Admitting: Psychiatry

## 2018-06-04 VITALS — BP 131/74 | HR 71 | Temp 97.6°F | Wt 250.8 lb

## 2018-06-04 DIAGNOSIS — F331 Major depressive disorder, recurrent, moderate: Secondary | ICD-10-CM

## 2018-06-04 DIAGNOSIS — F17200 Nicotine dependence, unspecified, uncomplicated: Secondary | ICD-10-CM | POA: Diagnosis not present

## 2018-06-04 DIAGNOSIS — R69 Illness, unspecified: Secondary | ICD-10-CM | POA: Diagnosis not present

## 2018-06-04 MED ORDER — BUPROPION HCL 75 MG PO TABS: 75.0000 mg | ORAL_TABLET | Freq: Every morning | ORAL | 0 refills | Status: DC
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
## 2018-06-11 ENCOUNTER — Ambulatory Visit: Payer: Medicare HMO | Admitting: Licensed Clinical Social Worker

## 2018-06-11 DIAGNOSIS — F331 Major depressive disorder, recurrent, moderate: Secondary | ICD-10-CM | POA: Diagnosis not present

## 2018-06-11 DIAGNOSIS — R69 Illness, unspecified: Secondary | ICD-10-CM | POA: Diagnosis not present

## 2018-06-16 DIAGNOSIS — E782 Mixed hyperlipidemia: Secondary | ICD-10-CM | POA: Diagnosis not present

## 2018-06-16 DIAGNOSIS — R0602 Shortness of breath: Secondary | ICD-10-CM | POA: Diagnosis not present

## 2018-06-16 DIAGNOSIS — I2510 Atherosclerotic heart disease of native coronary artery without angina pectoris: Secondary | ICD-10-CM | POA: Insufficient documentation

## 2018-06-16 DIAGNOSIS — I10 Essential (primary) hypertension: Secondary | ICD-10-CM | POA: Diagnosis not present

## 2018-06-16 DIAGNOSIS — I6523 Occlusion and stenosis of bilateral carotid arteries: Secondary | ICD-10-CM | POA: Diagnosis not present

## 2018-06-16 DIAGNOSIS — I25118 Atherosclerotic heart disease of native coronary artery with other forms of angina pectoris: Secondary | ICD-10-CM | POA: Diagnosis not present

## 2018-07-02 ENCOUNTER — Ambulatory Visit: Payer: Medicare HMO | Admitting: Licensed Clinical Social Worker

## 2018-07-07 DIAGNOSIS — I6523 Occlusion and stenosis of bilateral carotid arteries: Secondary | ICD-10-CM | POA: Diagnosis not present

## 2018-07-07 DIAGNOSIS — I25118 Atherosclerotic heart disease of native coronary artery with other forms of angina pectoris: Secondary | ICD-10-CM | POA: Diagnosis not present

## 2018-07-13 ENCOUNTER — Ambulatory Visit: Payer: Medicare HMO | Admitting: Psychiatry

## 2018-07-21 DIAGNOSIS — R0602 Shortness of breath: Secondary | ICD-10-CM | POA: Diagnosis not present

## 2018-07-21 DIAGNOSIS — I2510 Atherosclerotic heart disease of native coronary artery without angina pectoris: Secondary | ICD-10-CM | POA: Diagnosis not present

## 2018-07-21 DIAGNOSIS — I119 Hypertensive heart disease without heart failure: Secondary | ICD-10-CM | POA: Diagnosis not present

## 2018-07-21 DIAGNOSIS — I10 Essential (primary) hypertension: Secondary | ICD-10-CM | POA: Diagnosis not present

## 2018-07-21 DIAGNOSIS — E782 Mixed hyperlipidemia: Secondary | ICD-10-CM | POA: Diagnosis not present

## 2018-07-27 DIAGNOSIS — I119 Hypertensive heart disease without heart failure: Secondary | ICD-10-CM | POA: Insufficient documentation

## 2018-07-28 ENCOUNTER — Encounter: Payer: Self-pay | Admitting: Psychiatry

## 2018-07-28 ENCOUNTER — Ambulatory Visit: Payer: Medicare HMO | Admitting: Psychiatry

## 2018-07-28 ENCOUNTER — Other Ambulatory Visit: Payer: Self-pay

## 2018-07-28 VITALS — BP 138/73 | HR 75 | Temp 98.7°F | Wt 249.4 lb

## 2018-07-28 DIAGNOSIS — R69 Illness, unspecified: Secondary | ICD-10-CM | POA: Diagnosis not present

## 2018-07-28 DIAGNOSIS — F331 Major depressive disorder, recurrent, moderate: Secondary | ICD-10-CM

## 2018-07-28 DIAGNOSIS — F5105 Insomnia due to other mental disorder: Secondary | ICD-10-CM | POA: Diagnosis not present

## 2018-07-28 DIAGNOSIS — F17200 Nicotine dependence, unspecified, uncomplicated: Secondary | ICD-10-CM

## 2018-07-28 MED ORDER — ESCITALOPRAM OXALATE 5 MG PO TABS: 5.0000 mg | ORAL_TABLET | Freq: Every day | ORAL | 1 refills | Status: DC

## 2018-07-28 NOTE — Patient Instructions (Signed)
Escitalopram tablets What is this medicine? ESCITALOPRAM (es sye TAL oh pram) is used to treat depression and certain types of anxiety. This medicine may be used for other purposes; ask your health care provider or pharmacist if you have questions. COMMON BRAND NAME(S): Lexapro What should I tell my health care provider before I take this medicine? They need to know if you have any of these conditions: -bipolar disorder or a family history of bipolar disorder -diabetes -glaucoma -heart disease -kidney or liver disease -receiving electroconvulsive therapy -seizures (convulsions) -suicidal thoughts, plans, or attempt by you or a family member -an unusual or allergic reaction to escitalopram, the related drug citalopram, other medicines, foods, dyes, or preservatives -pregnant or trying to become pregnant -breast-feeding How should I use this medicine? Take this medicine by mouth with a glass of water. Follow the directions on the prescription label. You can take it with or without food. If it upsets your stomach, take it with food. Take your medicine at regular intervals. Do not take it more often than directed. Do not stop taking this medicine suddenly except upon the advice of your doctor. Stopping this medicine too quickly may cause serious side effects or your condition may worsen. A special MedGuide will be given to you by the pharmacist with each prescription and refill. Be sure to read this information carefully each time. Talk to your pediatrician regarding the use of this medicine in children. Special care may be needed. Overdosage: If you think you have taken too much of this medicine contact a poison control center or emergency room at once. NOTE: This medicine is only for you. Do not share this medicine with others. What if I miss a dose? If you miss a dose, take it as soon as you can. If it is almost time for your next dose, take only that dose. Do not take double or extra  doses. What may interact with this medicine? Do not take this medicine with any of the following medications: -certain medicines for fungal infections like fluconazole, itraconazole, ketoconazole, posaconazole, voriconazole -cisapride -citalopram -dofetilide -dronedarone -linezolid -MAOIs like Carbex, Eldepryl, Marplan, Nardil, and Parnate -methylene blue (injected into a vein) -pimozide -thioridazine -ziprasidone This medicine may also interact with the following medications: -alcohol -amphetamines -aspirin and aspirin-like medicines -carbamazepine -certain medicines for depression, anxiety, or psychotic disturbances -certain medicines for migraine headache like almotriptan, eletriptan, frovatriptan, naratriptan, rizatriptan, sumatriptan, zolmitriptan -certain medicines for sleep -certain medicines that treat or prevent blood clots like warfarin, enoxaparin, dalteparin -cimetidine -diuretics -fentanyl -furazolidone -isoniazid -lithium -metoprolol -NSAIDs, medicines for pain and inflammation, like ibuprofen or naproxen -other medicines that prolong the QT interval (cause an abnormal heart rhythm) -procarbazine -rasagiline -supplements like St. John's wort, kava kava, valerian -tramadol -tryptophan This list may not describe all possible interactions. Give your health care provider a list of all the medicines, herbs, non-prescription drugs, or dietary supplements you use. Also tell them if you smoke, drink alcohol, or use illegal drugs. Some items may interact with your medicine. What should I watch for while using this medicine? Tell your doctor if your symptoms do not get better or if they get worse. Visit your doctor or health care professional for regular checks on your progress. Because it may take several weeks to see the full effects of this medicine, it is important to continue your treatment as prescribed by your doctor. Patients and their families should watch out for  new or worsening thoughts of suicide or depression. Also watch out for   sudden changes in feelings such as feeling anxious, agitated, panicky, irritable, hostile, aggressive, impulsive, severely restless, overly excited and hyperactive, or not being able to sleep. If this happens, especially at the beginning of treatment or after a change in dose, call your health care professional. You may get drowsy or dizzy. Do not drive, use machinery, or do anything that needs mental alertness until you know how this medicine affects you. Do not stand or sit up quickly, especially if you are an older patient. This reduces the risk of dizzy or fainting spells. Alcohol may interfere with the effect of this medicine. Avoid alcoholic drinks. Your mouth may get dry. Chewing sugarless gum or sucking hard candy, and drinking plenty of water may help. Contact your doctor if the problem does not go away or is severe. What side effects may I notice from receiving this medicine? Side effects that you should report to your doctor or health care professional as soon as possible: -allergic reactions like skin rash, itching or hives, swelling of the face, lips, or tongue -anxious -black, tarry stools -changes in vision -confusion -elevated mood, decreased need for sleep, racing thoughts, impulsive behavior -eye pain -fast, irregular heartbeat -feeling faint or lightheaded, falls -feeling agitated, angry, or irritable -hallucination, loss of contact with reality -loss of balance or coordination -loss of memory -painful or prolonged erections -restlessness, pacing, inability to keep still -seizures -stiff muscles -suicidal thoughts or other mood changes -trouble sleeping -unusual bleeding or bruising -unusually weak or tired -vomiting Side effects that usually do not require medical attention (report to your doctor or health care professional if they continue or are bothersome): -changes in appetite -change in sex  drive or performance -headache -increased sweating -indigestion, nausea -tremors This list may not describe all possible side effects. Call your doctor for medical advice about side effects. You may report side effects to FDA at 1-800-FDA-1088. Where should I keep my medicine? Keep out of reach of children. Store at room temperature between 15 and 30 degrees C (59 and 86 degrees F). Throw away any unused medicine after the expiration date. NOTE: This sheet is a summary. It may not cover all possible information. If you have questions about this medicine, talk to your doctor, pharmacist, or health care provider.  2019 Elsevier/Gold Standard (2015-12-11 13:20:23)  

## 2018-07-28 NOTE — Progress Notes (Signed)
BH MD OP Progress Note  07/28/2018 5:23 PM Markasia Carrol  MRN:  [id number]  Chief Complaint: ' I am here for follow up." Chief Complaint    Follow-up; Medication Refill     HPI: Kim Harding is a 73 year old African-American female, widowed, lives in [place], has a history of depression, diabetes mellitus, hypertension, hyperlipidemia, OSA on CPAP, tobacco use disorder, presented to the clinic today for a follow-up visit.  Patient reports she continues to struggle with some depressive symptoms.  She describes her depressive symptoms as lack of motivation and being withdrawn .  She also describes some sadness on and off.  She reports the Wellbutrin was helping however she developed a rash and hence stopped taking it.  Patient also continues to have sleep problems.  She reports she falls asleep around 1 AM and her sleep is interrupted.  She reports she wakes up around 7 AM.  She however declines any medication for sleep today.  She reports she has to wake up anyway since she is on a diuretic.  She reports her sleep issues could be related to her need to wake up several times due to being on that medication.  Patient reports she was able to cut down on her smoking while she was on Wellbutrin.  Now she is back smoking again and wonders whether she can be prescribed a medication for the same.  Discussed Chantix however she reports she did not tolerate it well.  Discussed nicotine replacement-she reports she did not tolerate that well and does not want to use that.  Patient denies any suicidality.  Patient denies any homicidality.  Patient denies any perceptual disturbances. Visit Diagnosis:    ICD-10-CM   1. MDD (major depressive disorder), recurrent episode, moderate (HCC) F33.1   2. Tobacco use disorder F17.200   3. Insomnia due to mental condition F51.05     Past Psychiatric History: Reviewed past psychiatric history from my progress note on 05/14/2018  Past Medical History:  Past Medical  History:  Diagnosis Date  . Allergy   . Arthritis   . Chicken pox   . COPD (chronic obstructive pulmonary disease) (HCC)   . Depression   . Diabetes mellitus without complication (HCC)   . Diabetes mellitus, type II (HCC)   . Diverticulitis   . Hyperlipidemia   . Hypertension   . Schizophrenia [facility])     Past Surgical History:  Procedure Laterality Date  . ABDOMINAL HYSTERECTOMY  1994   partial  . BARIATRIC SURGERY     sleeve--2014  . CARPAL TUNNEL RELEASE    . CHOLECYSTECTOMY  2012  . REPLACEMENT TOTAL KNEE BILATERAL    . ROTATOR CUFF REPAIR Right 11/21/2015  . SHOULDER SURGERY Left 09/2014   rotator cuff  . TONSILLECTOMY  1960    Family Psychiatric History: I have reviewed family psychiatric history from my progress note on 05/14/2018.  Family History:  Family History  Problem Relation Age of Onset  . Heart disease Mother   . Arthritis Father   . Cancer Father        Prostate  . Diabetes Sister   . Drug abuse Sister   . Diabetes Brother   . Drug abuse Brother   . Alcohol abuse Brother   . Drug abuse Brother   . Breast cancer Paternal Aunt     Social History: I have reviewed social history from my progress note on 05/14/2018. Social History   Socioeconomic History  . Marital status: Widowed    Spouse  name: Not on file  . Number of children: 3  . Years of education: Not on file  . Highest education level: High school graduate  Occupational History  . Occupation: retired  Engineer, production  . Financial resource strain: Not hard at all  . Food insecurity:    Worry: Never true    Inability: Never true  . Transportation needs:    Medical: No    Non-medical: No  Tobacco Use  . Smoking status: Current Every Day Smoker    Packs/day: 1.00    Years: 49.00    Pack years: 49.00    Types: Cigarettes  . Smokeless tobacco: Never Used  Substance and Sexual Activity  . Alcohol use: No    Alcohol/week: 0.0 standard drinks  . Drug use: No  . Sexual activity: Not  Currently  Lifestyle  . Physical activity:    Days per week: 3 days    Minutes per session: 60 min  . Stress: Rather much  Relationships  . Social connections:    Talks on phone: Not on file    Gets together: Not on file    Attends religious service: More than 4 times per year    Active member of club or organization: No    Attends meetings of clubs or organizations: Never    Relationship status: Widowed  Other Topics Concern  . Not on file  Social History Narrative   Church activities, [place]    Allergies:  Allergies  Allergen Reactions  . Aspirin Hives  . Darvon [Propoxyphene] Hives  . Latex Hives, Itching and Swelling  . Naproxen Hives and Itching    Metabolic Disorder Labs: Lab Results  Component Value Date   HGBA1C 6.3 (H) 02/06/2018   MPG 134 02/06/2018   No results found for: PROLACTIN Lab Results  Component Value Date   CHOL 146 02/06/2018   TRIG 146 02/06/2018   HDL 42 (L) 02/06/2018   CHOLHDL 3.5 02/06/2018   VLDL 88.5 02/04/2017   LDLCALC 79 02/06/2018   LDLCALC 72 02/04/2017   No results found for: TSH  Therapeutic Level Labs: No results found for: LITHIUM No results found for: VALPROATE No components found for:  CBMZ  Current Medications: Current Outpatient Medications  Medication Sig Dispense Refill  . albuterol (PROVENTIL HFA;VENTOLIN HFA) 108 (90 Base) MCG/ACT inhaler Inhale 2 puffs into the lungs every 6 (six) hours as needed for wheezing or shortness of breath. 6.7 Inhaler 3  . Cholecalciferol (VITAMIN D-3) 1000 UNITS CAPS Take 1 capsule by mouth daily.    Marland Kitchen escitalopram (LEXAPRO) 5 MG tablet Take 1 tablet (5 mg total) by mouth daily with supper. For mood 30 tablet 1  . esomeprazole (NEXIUM) 40 MG capsule TAKE 1 CAPSULE BY MOUTH  DAILY AT 12 NOON 90 capsule 2  . hydrochlorothiazide (HYDRODIURIL) 25 MG tablet Take 1 tablet (25 mg total) by mouth daily. 90 tablet 2  . meclizine (ANTIVERT) 25 MG tablet Take 1 tablet (25 mg total) by  mouth 3 (three) times daily as needed for dizziness. 90 tablet 0  . metFORMIN (GLUCOPHAGE) 500 MG tablet Take 1 tablet (500 mg total) by mouth 2 (two) times daily with a meal. 180 tablet 2  . metoprolol succinate (TOPROL-XL) 100 MG 24 hr tablet     . potassium chloride SA (K-DUR,KLOR-CON) 20 MEQ tablet TAKE 1 TABLET BY MOUTH  DAILY 90 tablet 2  . rosuvastatin (CRESTOR) 10 MG tablet Take 1 tablet (10 mg total) by mouth daily. 90  tablet 2  . Spacer/Aero Chamber Mouthpiece MISC 1 Units by Does not apply route as needed. 1 each 0  . Spacer/Aero-Holding Chambers (OPTICHAMBER DIAMOND-LG MASK) DEVI See admin instructions. use with inhaler  0   No current facility-administered medications for this visit.      Musculoskeletal: Strength & Muscle Tone: within normal limits Gait & Station: normal Patient leans: N/A  Psychiatric Specialty Exam: Review of Systems  Psychiatric/Behavioral: Positive for depression. The patient has insomnia.   All other systems reviewed and are negative.   Blood pressure 138/73, pulse 75, temperature 98.7 F (37.1 C), temperature source Oral, weight 249 lb 6.4 oz (113.1 kg).Body mass index is 40.25 kg/m.  General Appearance: Casual  Eye Contact:  Fair  Speech:  Clear and Coherent  Volume:  Normal  Mood:  Dysphoric  Affect:  Congruent  Thought Process:  Goal Directed and Descriptions of Associations: Intact  Orientation:  Full (Time, Place, and Person)  Thought Content: Logical   Suicidal Thoughts:  No  Homicidal Thoughts:  No  Memory:  Immediate;   Fair Recent;   Fair Remote;   Fair  Judgement:  Fair  Insight:  Fair  Psychomotor Activity:  Normal  Concentration:  Concentration: Fair and Attention Span: Fair  Recall:  FiservFair  Fund of Knowledge: Fair  Language: Fair  Akathisia:  No  Handed:  Right  AIMS -denies tremors rigidity stiffness  Assets:  Communication Skills Desire for Improvement Social Support  ADL's:  Intact  Cognition: WNL  Sleep:   restless   Screenings: PHQ2-9     [facility]  PHQ-2 Total Score  0  1  0  0  0  PHQ-9 Total Score  -  4  -  -  -       Assessment and Plan: Kim Harding is a 73 year old African-American female, widowed, lives in [place], has a history of depression, tobacco use disorder, OSA on CPAP, hypertension, hyperlipidemia, diabetes mellitus, presented to the clinic today for a follow-up visit.  Patient is biologically predisposed given her history of trauma, family history of mental health problems.  She also has psychosocial stressors of being widowed.  Patient could not tolerate Wellbutrin and hence is noncompliant on the medication.  She will benefit from continued medication readjustment.  She will also continue to need psychotherapy sessions.  Plan MDD-worsening Discontinue Wellbutrin for side effects Start Lexapro 5 mg p.o. daily with a meal. Patient advised to continue CBT.  For tobacco use disorder Patient has failed or had side effects to Wellbutrin, Chantix as well as nicotine replacement. Recommended CBT.  Insomnia - restless on and off Declines medications. Discussed sleep hygiene techniques.  Will order TSH again today.  Follow-up in clinic in 1 month or sooner if needed.  I have spent atleast 15 minutes face to face with patient today. More than 50 % of the time was spent for psychoeducation and supportive psychotherapy and care coordination.  This note was generated in part or whole with voice recognition software. Voice recognition is usually quite accurate but there are transcription errors that can and very often do occur. I apologize for any typographical errors that  were not detected and corrected.  Jomarie Longs, MD 07/28/2018, 5:23 PM

## 2018-07-31 ENCOUNTER — Other Ambulatory Visit: Payer: Self-pay | Admitting: Psychiatry

## 2018-07-31 DIAGNOSIS — R69 Illness, unspecified: Secondary | ICD-10-CM | POA: Diagnosis not present

## 2018-07-31 NOTE — Progress Notes (Signed)
   THERAPIST PROGRESS NOTE  Session Time: 60 min  Participation Level: Active  Behavioral Response: CasualAlertEuthymic  Type of Therapy: Individual Therapy  Treatment Goals addressed: Coping  Interventions: CBT and Motivational Interviewing  Summary: Kim Harding is a 73 y.o. female who presents with continued symptoms of her diagnosis.  Therapist met with Patient in an initial therapy session to assess current mood and to build rapport. Therapist engaged Patient in discussion about her life and what is going well for her. Therapist provided support for Patient as she shared details about her life, her current stressors, mood, coping skills, her past, and her children. Therapist prompted Patient to discuss her support system and ways that she manages her daily stress, anger, and frustrations.   LCSW discussed what psychotherapy is and is not and the importance of the therapeutic relationship to include open and honest communication between client and therapist and building trust.  Reviewed advantages and disadvantages of the therapeutic process and limitations to the therapeutic relationship including LCSW's role in maintaining the safety of the client, others and those in client's care.    Suicidal/Homicidal: No  Plan: Return again in 2 weeks.  Diagnosis: Axis I: Depression    Axis II: No Diagnosis    Lubertha South, LCSW 06/11/2018

## 2018-08-01 LAB — TSH: TSH: 3.94 u[IU]/mL (ref 0.450–4.500)

## 2018-08-22 ENCOUNTER — Other Ambulatory Visit: Payer: Self-pay | Admitting: Psychiatry

## 2018-08-27 ENCOUNTER — Other Ambulatory Visit: Payer: Self-pay

## 2018-08-27 ENCOUNTER — Ambulatory Visit: Payer: Medicare HMO | Admitting: Psychiatry

## 2018-08-27 ENCOUNTER — Encounter: Payer: Self-pay | Admitting: Psychiatry

## 2018-08-27 VITALS — BP 137/76 | HR 77 | Temp 98.7°F | Wt 246.4 lb

## 2018-08-27 DIAGNOSIS — F331 Major depressive disorder, recurrent, moderate: Secondary | ICD-10-CM

## 2018-08-27 DIAGNOSIS — F17200 Nicotine dependence, unspecified, uncomplicated: Secondary | ICD-10-CM

## 2018-08-27 DIAGNOSIS — F5105 Insomnia due to other mental disorder: Secondary | ICD-10-CM | POA: Diagnosis not present

## 2018-08-27 DIAGNOSIS — R69 Illness, unspecified: Secondary | ICD-10-CM | POA: Diagnosis not present

## 2018-08-27 MED ORDER — SERTRALINE HCL 25 MG PO TABS: 25.0000 mg | ORAL_TABLET | Freq: Every day | ORAL | 0 refills | Status: DC

## 2018-08-27 NOTE — Patient Instructions (Signed)

## 2018-08-27 NOTE — Progress Notes (Signed)
BH MD OP Progress Note  08/27/2018 2:32 PM Kim BorneJosie Harding  MRN:  [id number]  Chief Complaint: 'I am here for follow up.' Chief Complaint    Follow-up     HPI: Kim Harding is a 73 yr old can American female, widowed, [place], has a history of depression, diabetes mellitus, hypertension, hyperlipidemia, OSA on CPAP, tobacco use disorder, presented to the clinic today for a follow-up visit.  Patient reports that she continues to struggle with depressive symptoms.  Patient reports she struggles with lack of motivation, being withdrawn, sadness, lack of appetite, sleep problems.  She reports she tried the Lexapro however it made her jittery and nervous.  She hence stopped taking it.  She reports she used to go for an exercise program and she has not done it at all this week.  She does not have any interest to do anything this week.  Patient denies any suicidality.  Discussed adding Zoloft.  She agrees with plan.  Also discussed adding a sleep aid.  Patient however declines.  Discussed getting GeneSight testing done.  Provided her information for the same.   Visit Diagnosis:    ICD-10-CM   1. MDD (major depressive disorder), recurrent episode, moderate (HCC) F33.1 sertraline (ZOLOFT) 25 MG tablet  2. Tobacco use disorder F17.200   3. Insomnia due to mental condition F51.05     Past Psychiatric History: I have reviewed past psychiatric history from my progress note on 05/14/2018.  Past Medical History:  Past Medical History:  Diagnosis Date  . Allergy   . Arthritis   . Chicken pox   . COPD (chronic obstructive pulmonary disease) (HCC)   . Depression   . Diabetes mellitus without complication (HCC)   . Diabetes mellitus, type II (HCC)   . Diverticulitis   . Hyperlipidemia   . Hypertension   . Schizophrenia [facility](HCC)     Past Surgical History:  Procedure Laterality Date  . ABDOMINAL HYSTERECTOMY  1994   partial  . BARIATRIC SURGERY     sleeve--2014  . CARPAL TUNNEL RELEASE     . CHOLECYSTECTOMY  2012  . REPLACEMENT TOTAL KNEE BILATERAL    . ROTATOR CUFF REPAIR Right 11/21/2015  . SHOULDER SURGERY Left 09/2014   rotator cuff  . TONSILLECTOMY  1960    Family Psychiatric History: I have reviewed family psychiatric history from my progress note on 05/14/2018.  Family History:  Family History  Problem Relation Age of Onset  . Heart disease Mother   . Arthritis Father   . Cancer Father        Prostate  . Diabetes Sister   . Drug abuse Sister   . Diabetes Brother   . Drug abuse Brother   . Alcohol abuse Brother   . Drug abuse Brother   . Breast cancer Paternal Aunt     Social History: Reviewed social history from my progress note on 05/14/2018. Social History   Socioeconomic History  . Marital status: Widowed    Spouse name: Not on file  . Number of children: 3  . Years of education: Not on file  . Highest education level: High school graduate  Occupational History  . Occupation: retired  Engineer, productionocial Needs  . Financial resource strain: Not hard at all  . Food insecurity:    Worry: Never true    Inability: Never true  . Transportation needs:    Medical: No    Non-medical: No  Tobacco Use  . Smoking status: Current Every Day Smoker  Packs/day: 1.00    Years: 49.00    Pack years: 49.00    Types: Cigarettes  . Smokeless tobacco: Never Used  Substance and Sexual Activity  . Alcohol use: No    Alcohol/week: 0.0 standard drinks  . Drug use: No  . Sexual activity: Not Currently  Lifestyle  . Physical activity:    Days per week: 3 days    Minutes per session: 60 min  . Stress: Rather much  Relationships  . Social connections:    Talks on phone: Not on file    Gets together: Not on file    Attends religious service: More than 4 times per year    Active member of club or organization: No    Attends meetings of clubs or organizations: Never    Relationship status: Widowed  Other Topics Concern  . Not on file  Social History Narrative    Church activities, [place]    Allergies:  Allergies  Allergen Reactions  . Aspirin Hives  . Darvon [Propoxyphene] Hives  . Latex Hives, Itching and Swelling  . Naproxen Hives and Itching    Metabolic Disorder Labs: Lab Results  Component Value Date   HGBA1C 6.3 (H) 02/06/2018   MPG 134 02/06/2018   No results found for: PROLACTIN Lab Results  Component Value Date   CHOL 146 02/06/2018   TRIG 146 02/06/2018   HDL 42 (L) 02/06/2018   CHOLHDL 3.5 02/06/2018   VLDL 22.4 02/04/2017   LDLCALC 79 02/06/2018   LDLCALC 72 02/04/2017   Lab Results  Component Value Date   TSH 3.940 07/31/2018    Therapeutic Level Labs: No results found for: LITHIUM No results found for: VALPROATE No components found for:  CBMZ  Current Medications: Current Outpatient Medications  Medication Sig Dispense Refill  . albuterol (PROVENTIL HFA;VENTOLIN HFA) 108 (90 Base) MCG/ACT inhaler Inhale 2 puffs into the lungs every 6 (six) hours as needed for wheezing or shortness of breath. 6.7 Inhaler 3  . Cholecalciferol (VITAMIN D-3) 1000 UNITS CAPS Take 1 capsule by mouth daily.    Marland Kitchen esomeprazole (NEXIUM) 40 MG capsule TAKE 1 CAPSULE BY MOUTH  DAILY AT 12 NOON 90 capsule 2  . hydrochlorothiazide (HYDRODIURIL) 25 MG tablet Take 1 tablet (25 mg total) by mouth daily. 90 tablet 2  . hydrochlorothiazide (HYDRODIURIL) 25 MG tablet     . meclizine (ANTIVERT) 25 MG tablet Take 1 tablet (25 mg total) by mouth 3 (three) times daily as needed for dizziness. 90 tablet 0  . metFORMIN (GLUCOPHAGE) 500 MG tablet Take 1 tablet (500 mg total) by mouth 2 (two) times daily with a meal. 180 tablet 2  . metoprolol succinate (TOPROL-XL) 100 MG 24 hr tablet     . Potassium Chloride ER 20 MEQ TBCR     . potassium chloride SA (K-DUR,KLOR-CON) 20 MEQ tablet TAKE 1 TABLET BY MOUTH  DAILY 90 tablet 2  . rosuvastatin (CRESTOR) 10 MG tablet Take 1 tablet (10 mg total) by mouth daily. 90 tablet 2  . Spacer/Aero Chamber  Mouthpiece MISC 1 Units by Does not apply route as needed. 1 each 0  . Spacer/Aero-Holding Chambers (OPTICHAMBER DIAMOND-LG MASK) DEVI See admin instructions. use with inhaler  0  . sertraline (ZOLOFT) 25 MG tablet Take 1 tablet (25 mg total) by mouth daily with breakfast. 15 tablet 0   No current facility-administered medications for this visit.      Musculoskeletal: Strength & Muscle Tone: within normal limits Gait & Station: normal  Patient leans: N/A  Psychiatric Specialty Exam: Review of Systems  Psychiatric/Behavioral: Positive for depression. The patient has insomnia.   All other systems reviewed and are negative.   Blood pressure 137/76, pulse 77, temperature 98.7 F (37.1 C), temperature source Oral, weight 246 lb 6.4 oz (111.8 kg).Body mass index is 39.77 kg/m.  General Appearance: Casual  Eye Contact:  Fair  Speech:  Clear and Coherent  Volume:  Normal  Mood:  Depressed  Affect:  Congruent  Thought Process:  Goal Directed and Descriptions of Associations: Intact  Orientation:  Full (Time, Place, and Person)  Thought Content: Logical   Suicidal Thoughts:  No  Homicidal Thoughts:  No  Memory:  Immediate;   Fair Recent;   Fair Remote;   Fair  Judgement:  Fair  Insight:  Fair  Psychomotor Activity:  Normal  Concentration:  Concentration: Fair and Attention Span: Fair  Recall:  FiservFair  Fund of Knowledge: Fair  Language: Fair  Akathisia:  No  Handed:  Right  AIMS (if indicated): denies tremors, rigidity,stiffness  Assets:  Communication Skills Desire for Improvement Social Support  ADL's:  Intact  Cognition: WNL  Sleep:  Poor   Screenings: PHQ2-9     [facility]  PHQ-2 Total Score  0  1  0  0  0  PHQ-9 Total Score  -  4  -  -  -       Assessment and Plan: Kim Harding is a 73 yr old African-American female, widowed, [place] in [place], has a history of depression, tobacco use disorder, OSA on CPAP, hypertension, hyperlipidemia, diabetes mellitus, presented to the clinic today for a follow-up visit.  Patient is biologically predisposed given her family history of mental health problems, history of trauma.  She also has psychosocial stressors of being widowed.  Patient did not tolerate the Lexapro and hence will try another medication.  We will continue to make medication changes.  Plan MDD- unstable Discontinue Lexapro for side effects Start Zoloft 25 mg p.o. daily Continue CBT.   For tobacco use disorder-unstable Continue CBT.  For insomnia- unstable Discussed sleep hygiene techniques Patient declines medication.  Offered her GeneSight testing information.  TSH labs reviewed dated 07/31/2018-within normal limits.  Follow-up in clinic in 10 days or sooner if needed.  I have spent atleast 15 minutes face to face with patient today. More than 50 % of the time was spent for psychoeducation and supportive psychotherapy and care coordination.  This note was generated in part or whole with voice recognition software. Voice recognition is usually quite accurate but there are transcription errors that can and very often do occur. I apologize for any typographical errors that were not detected and corrected.       Jomarie LongsSaramma Corbyn Wildey, MD 08/27/2018, 2:32 PM

## 2018-09-07 ENCOUNTER — Ambulatory Visit: Payer: Medicare HMO | Admitting: Psychiatry

## 2018-09-07 ENCOUNTER — Encounter: Payer: Self-pay | Admitting: Psychiatry

## 2018-09-07 ENCOUNTER — Other Ambulatory Visit: Payer: Self-pay

## 2018-09-07 VITALS — BP 134/78 | HR 77 | Temp 98.7°F | Wt 245.0 lb

## 2018-09-07 DIAGNOSIS — F17200 Nicotine dependence, unspecified, uncomplicated: Secondary | ICD-10-CM | POA: Diagnosis not present

## 2018-09-07 DIAGNOSIS — F5105 Insomnia due to other mental disorder: Secondary | ICD-10-CM

## 2018-09-07 DIAGNOSIS — R69 Illness, unspecified: Secondary | ICD-10-CM | POA: Diagnosis not present

## 2018-09-07 DIAGNOSIS — F331 Major depressive disorder, recurrent, moderate: Secondary | ICD-10-CM

## 2018-09-07 MED ORDER — SERTRALINE HCL 25 MG PO TABS: 25.0000 mg | ORAL_TABLET | Freq: Every day | ORAL | 0 refills | Status: DC

## 2018-09-07 NOTE — Progress Notes (Signed)
BH MD OP Progress Note  09/07/2018 1:06 PM Kim Harding  MRN:  [id number]  Chief Complaint: ' I am here for follow up." Chief Complaint    Follow-up     HPI: Kim Harding is a 73 yr old African-American female, widowed, lives in [place], has a history of depression, diabetes mellitus, hypertension, hyperlipidemia, OSA on CPAP, tobacco use disorder, presented to clinic today for a follow-up visit.  Patient today reports she is tolerating the Zoloft well.  She denies any side effects.  She reports the Zoloft as helping some however she struggles with lack of motivation and being withdrawn.  She reports she wanted to go to church this Sunday however was unable to do so.  Patient denies any suicidality.  Patient reports she has started sleeping better and that is an improvement since last visit.  Discussed referring her back to her psychotherapist.  She will make an appointment with Ms. Peacock again.  Patient denies any other concerns today. Visit Diagnosis:    ICD-10-CM   1. MDD (major depressive disorder), recurrent episode, moderate (HCC) F33.1 sertraline (ZOLOFT) 25 MG tablet  2. Tobacco use disorder F17.200   3. Insomnia due to mental condition F51.05     Past Psychiatric History: I have reviewed past psychiatric history from my progress note on 05/14/2018  Past Medical History:  Past Medical History:  Diagnosis Date  . Allergy   . Arthritis   . Chicken pox   . COPD (chronic obstructive pulmonary disease) (HCC)   . Depression   . Diabetes mellitus without complication (HCC)   . Diabetes mellitus, type II (HCC)   . Diverticulitis   . Hyperlipidemia   . Hypertension   . Schizophrenia [facility](HCC)     Past Surgical History:  Procedure Laterality Date  . ABDOMINAL HYSTERECTOMY  1994   partial  . BARIATRIC SURGERY     sleeve--2014  . CARPAL TUNNEL RELEASE    . CHOLECYSTECTOMY  2012  . REPLACEMENT TOTAL KNEE BILATERAL    . ROTATOR CUFF REPAIR Right 11/21/2015  . SHOULDER SURGERY  Left 09/2014   rotator cuff  . TONSILLECTOMY  1960    Family Psychiatric History: Reviewed family psychiatric history from my progress note on 05/14/2018.  Family History:  Family History  Problem Relation Age of Onset  . Heart disease Mother   . Arthritis Father   . Cancer Father        Prostate  . Diabetes Sister   . Drug abuse Sister   . Diabetes Brother   . Drug abuse Brother   . Alcohol abuse Brother   . Drug abuse Brother   . Breast cancer Paternal Aunt     Social History: Reviewed social history from my progress note on 05/14/2018. Social History   Socioeconomic History  . Marital status: Widowed    Spouse name: Not on file  . Number of children: 3  . Years of education: Not on file  . Highest education level: High school graduate  Occupational History  . Occupation: retired  Engineer, productionocial Needs  . Financial resource strain: Not hard at all  . Food insecurity:    Worry: Never true    Inability: Never true  . Transportation needs:    Medical: No    Non-medical: No  Tobacco Use  . Smoking status: Current Every Day Smoker    Packs/day: 1.00    Years: 49.00    Pack years: 49.00    Types: Cigarettes  . Smokeless tobacco: Never Used  Substance and Sexual Activity  . Alcohol use: No    Alcohol/week: 0.0 standard drinks  . Drug use: No  . Sexual activity: Not Currently  Lifestyle  . Physical activity:    Days per week: 3 days    Minutes per session: 60 min  . Stress: Rather much  Relationships  . Social connections:    Talks on phone: Not on file    Gets together: Not on file    Attends religious service: More than 4 times per year    Active member of club or organization: No    Attends meetings of clubs or organizations: Never    Relationship status: Widowed  Other Topics Concern  . Not on file  Social History Narrative   Church activities, [place]    Allergies:  Allergies  Allergen Reactions  . Aspirin Hives  . Darvon [Propoxyphene] Hives  .  Latex Hives, Itching and Swelling  . Naproxen Hives and Itching    Metabolic Disorder Labs: Lab Results  Component Value Date   HGBA1C 6.3 (H) 02/06/2018   MPG 134 02/06/2018   No results found for: PROLACTIN Lab Results  Component Value Date   CHOL 146 02/06/2018   TRIG 146 02/06/2018   HDL 42 (L) 02/06/2018   CHOLHDL 3.5 02/06/2018   VLDL 00.9 02/04/2017   LDLCALC 79 02/06/2018   LDLCALC 72 02/04/2017   Lab Results  Component Value Date   TSH 3.940 07/31/2018    Therapeutic Level Labs: No results found for: LITHIUM No results found for: VALPROATE No components found for:  CBMZ  Current Medications: Current Outpatient Medications  Medication Sig Dispense Refill  . albuterol (PROVENTIL HFA;VENTOLIN HFA) 108 (90 Base) MCG/ACT inhaler Inhale 2 puffs into the lungs every 6 (six) hours as needed for wheezing or shortness of breath. 6.7 Inhaler 3  . Cholecalciferol (VITAMIN D-3) 1000 UNITS CAPS Take 1 capsule by mouth daily.    Marland Kitchen esomeprazole (NEXIUM) 40 MG capsule TAKE 1 CAPSULE BY MOUTH  DAILY AT 12 NOON 90 capsule 2  . hydrochlorothiazide (HYDRODIURIL) 25 MG tablet Take 1 tablet (25 mg total) by mouth daily. 90 tablet 2  . hydrochlorothiazide (HYDRODIURIL) 25 MG tablet     . meclizine (ANTIVERT) 25 MG tablet Take 1 tablet (25 mg total) by mouth 3 (three) times daily as needed for dizziness. 90 tablet 0  . metFORMIN (GLUCOPHAGE) 500 MG tablet Take 1 tablet (500 mg total) by mouth 2 (two) times daily with a meal. 180 tablet 2  . metoprolol succinate (TOPROL-XL) 100 MG 24 hr tablet     . Potassium Chloride ER 20 MEQ TBCR     . potassium chloride SA (K-DUR,KLOR-CON) 20 MEQ tablet TAKE 1 TABLET BY MOUTH  DAILY 90 tablet 2  . rosuvastatin (CRESTOR) 10 MG tablet Take 1 tablet (10 mg total) by mouth daily. 90 tablet 2  . sertraline (ZOLOFT) 25 MG tablet Take 1 tablet (25 mg total) by mouth daily with breakfast. 30 tablet 0  . Spacer/Aero Chamber Mouthpiece MISC 1 Units by Does not  apply route as needed. 1 each 0  . Spacer/Aero-Holding Chambers (OPTICHAMBER DIAMOND-LG MASK) DEVI See admin instructions. use with inhaler  0   No current facility-administered medications for this visit.      Musculoskeletal: Strength & Muscle Tone: within normal limits Gait & Station: normal Patient leans: N/A  Psychiatric Specialty Exam: Review of Systems  Psychiatric/Behavioral: Positive for depression. The patient has insomnia (improving).   All other systems  reviewed and are negative.   Blood pressure 134/78, pulse 77, temperature 98.7 F (37.1 C), temperature source Oral, weight 245 lb (111.1 kg).Body mass index is 39.54 kg/m.  General Appearance: Casual  Eye Contact:  Fair  Speech:  Clear and Coherent  Volume:  Normal  Mood:  Dysphoric  Affect:  Congruent  Thought Process:  Goal Directed and Descriptions of Associations: Intact  Orientation:  Full (Time, Place, and Person)  Thought Content: Logical   Suicidal Thoughts:  No  Homicidal Thoughts:  No  Memory:  Immediate;   Fair Recent;   Fair Remote;   Fair  Judgement:  Fair  Insight:  Fair  Psychomotor Activity:  Normal  Concentration:  Concentration: Fair and Attention Span: Fair  Recall:  [employer] of Knowledge: Fair  Language: Fair  Akathisia:  No  Handed:  Right  AIMS (if indicated): denies tremors, rigidity,stiffness  Assets:  Communication Skills Desire for Improvement Social Support  ADL's:  Intact  Cognition: WNL  Sleep:  Improving   Screenings: PHQ2-9     Office Visit from 02/06/2018 in [facility] Visit from 02/04/2017 in [facility] Visit from 12/13/2015 in [facility] Visit from 02/07/2015 in [facility] Visit from 08/09/2014 in [facility] Total Score  0  1  0  0  0  PHQ-9 Total Score  -  4  -  -  -       Assessment and Plan: Netanya is a 73 yr  old [place] American female, widowed, lives in [place], has a history of depression, tobacco use disorder, OSA on CPAP, hypertension, hyperlipidemia, diabetes melitis, presented to clinic today for a follow-up visit.  Patient is biologically predisposed given her family history of mental health problems, history of trauma.  She also has psychosocial stressors of being widowed.  Patient is currently tolerating Zoloft well.  She is making progress.  We will continue plan as noted below.  Plan MDD-some improvement Zoloft 25 mg p.o. daily Will refer her back for psychotherapy sessions again.  Patient advised to make an appointment with Ms. Peacock.  Tobacco use disorder-unstable Continue CBT.  Patient failed Chantix.  Patient had allergic reaction to Wellbutrin.  For insomnia- making progress Continue sleep hygiene techniques.  Follow-up in clinic in 4 weeks or sooner if needed.  I have spent atleast 15 minutes face to face with patient today. More than 50 % of the time was spent for psychoeducation and supportive psychotherapy and care coordination.  This note was generated in part or whole with voice recognition software. Voice recognition is usually quite accurate but there are transcription errors that can and very often do occur. I apologize for any typographical errors that were not detected and corrected.        Jomarie Longs, MD 09/07/2018, 1:06 PM

## 2018-09-29 ENCOUNTER — Encounter: Payer: Self-pay | Admitting: Internal Medicine

## 2018-09-29 ENCOUNTER — Ambulatory Visit: Payer: Medicare HMO | Admitting: Internal Medicine

## 2018-09-29 VITALS — BP 126/74 | HR 77 | Temp 98.1°F | Wt 247.0 lb

## 2018-09-29 DIAGNOSIS — J301 Allergic rhinitis due to pollen: Secondary | ICD-10-CM | POA: Diagnosis not present

## 2018-09-29 MED ORDER — FLUTICASONE PROPIONATE 50 MCG/ACT NA SUSP: 2.0000 | Freq: Every day | NASAL | 6 refills | Status: DC

## 2018-09-29 MED ORDER — METHYLPREDNISOLONE ACETATE 80 MG/ML IJ SUSP
80.0000 mg | Freq: Once | INTRAMUSCULAR | Status: AC
  Administered 2018-09-29: 80 mg via INTRAMUSCULAR

## 2018-09-29 NOTE — Patient Instructions (Signed)

## 2018-09-29 NOTE — Progress Notes (Signed)
HPI  Pt presents to the clinic today with c/o ear pain, sore throat and cough. She reports this started 2-3 days ago. She describes the ear pain as sore and achy. She denies drainage or loss of hearing. She denies difficulty swallowing. The cough is productive at times but she is unsure of the color. She denies runny nose, nasal congestion, or shortness of breath. She denies fever, chills or body aches. She has tried Careers adviser with minimal relief. She has a history of allergies. She has not had sick contacts.   Review of Systems      Past Medical History:  Diagnosis Date  . Allergy   . Arthritis   . Chicken pox   . COPD (chronic obstructive pulmonary disease) (HCC)   . Depression   . Diabetes mellitus without complication (HCC)   . Diabetes mellitus, type II (HCC)   . Diverticulitis   . Hyperlipidemia   . Hypertension   . Schizophrenia (HCC)     Family History  Problem Relation Age of Onset  . Heart disease Mother   . Arthritis Father   . Cancer Father        Prostate  . Diabetes Sister   . Drug abuse Sister   . Diabetes Brother   . Drug abuse Brother   . Alcohol abuse Brother   . Drug abuse Brother   . Breast cancer Paternal Aunt     Social History   Socioeconomic History  . Marital status: Widowed    Spouse name: Not on file  . Number of children: 3  . Years of education: Not on file  . Highest education level: High school graduate  Occupational History  . Occupation: retired  Engineer, production  . Financial resource strain: Not hard at all  . Food insecurity:    Worry: Never true    Inability: Never true  . Transportation needs:    Medical: No    Non-medical: No  Tobacco Use  . Smoking status: Current Every Day Smoker    Packs/day: 1.00    Years: 49.00    Pack years: 49.00    Types: Cigarettes  . Smokeless tobacco: Never Used  Substance and Sexual Activity  . Alcohol use: No    Alcohol/week: 0.0 standard drinks  . Drug use: No  . Sexual activity: Not  Currently  Lifestyle  . Physical activity:    Days per week: 3 days    Minutes per session: 60 min  . Stress: Rather much  Relationships  . Social connections:    Talks on phone: Not on file    Gets together: Not on file    Attends religious service: More than 4 times per year    Active member of club or organization: No    Attends meetings of clubs or organizations: Never    Relationship status: Widowed  . Intimate partner violence:    Fear of current or ex partner: No    Emotionally abused: No    Physically abused: No    Forced sexual activity: No  Other Topics Concern  . Not on file  Social History Narrative   Church activities, [facility]    Allergies  Allergen Reactions  . Aspirin Hives  . Darvon [Propoxyphene] Hives  . Latex Hives, Itching and Swelling  . Naproxen Hives and Itching     Constitutional:  Denies headache, fatigue, fever or abrupt weight changes.  HEENT:  Positive ear pain, sore throat. Denies eye redness, eye pain, pressure behind  the eyes, facial pain, nasal congestion, ringing in the ears, wax buildup, runny nose or bloody nose. Respiratory: Positive cough. Denies difficulty breathing or shortness of breath.  Cardiovascular: Denies chest pain, chest tightness, palpitations or swelling in the hands or feet.   No other specific complaints in a complete review of systems (except as listed in HPI above).  Objective:   BP 126/74   Pulse 77   Temp 98.1 F (36.7 C) (Oral)   Wt 247 lb (112 kg)   SpO2 98%   BMI 39.87 kg/m   Wt Readings from Last 3 Encounters:  05/28/18 253 lb (114.8 kg)  04/29/18 253 lb (114.8 kg)  02/06/18 253 lb (114.8 kg)     General: Appears heer stated age, well developed, well nourished in NAD. HEENT: Head: normal shape and size, no sinus tenderness noted;Ears: Tm's gray and intact, normal light reflex; Nose: mucosa pink and moist, turbinates swollen; Throat/Mouth: + PND. Teeth present, mucosa pinks and moist, no exudate  noted, no lesions or ulcerations noted.  Neck: No cervical lymphadenopathy.  Cardiovascular: Normal rate and rhythm. S1,S2 noted.  No murmur, rubs or gallops noted.  Pulmonary/Chest: Normal effort and positive vesicular breath sounds. No respiratory distress. No wheezes, rales or ronchi noted.       Assessment & Plan:  Allergic Rhinitis:  Get some rest and drink plenty of water Continue Allegra 80 mg Depo IM today RX for Flonase 1 spray each nostril daily x 1 week  RTC as needed or if symptoms persist.   Nicki Reaper, NP

## 2018-09-29 NOTE — Addendum Note (Signed)
Addended by: Roena Malady on: 09/29/2018 04:13 PM   Modules accepted: Orders

## 2018-09-30 ENCOUNTER — Ambulatory Visit: Payer: Medicare HMO | Admitting: Licensed Clinical Social Worker

## 2018-09-30 ENCOUNTER — Other Ambulatory Visit: Payer: Self-pay

## 2018-09-30 DIAGNOSIS — F331 Major depressive disorder, recurrent, moderate: Secondary | ICD-10-CM | POA: Diagnosis not present

## 2018-09-30 DIAGNOSIS — R69 Illness, unspecified: Secondary | ICD-10-CM | POA: Diagnosis not present

## 2018-10-02 ENCOUNTER — Other Ambulatory Visit: Payer: Self-pay | Admitting: Internal Medicine

## 2018-10-02 ENCOUNTER — Other Ambulatory Visit: Payer: Self-pay

## 2018-10-02 ENCOUNTER — Encounter: Payer: Self-pay | Admitting: Psychiatry

## 2018-10-02 ENCOUNTER — Ambulatory Visit: Payer: Medicare HMO | Admitting: Psychiatry

## 2018-10-02 VITALS — BP 125/73 | HR 69 | Temp 99.5°F | Wt 244.4 lb

## 2018-10-02 DIAGNOSIS — F331 Major depressive disorder, recurrent, moderate: Secondary | ICD-10-CM

## 2018-10-02 DIAGNOSIS — F17200 Nicotine dependence, unspecified, uncomplicated: Secondary | ICD-10-CM

## 2018-10-02 DIAGNOSIS — F5105 Insomnia due to other mental disorder: Secondary | ICD-10-CM | POA: Diagnosis not present

## 2018-10-02 DIAGNOSIS — R69 Illness, unspecified: Secondary | ICD-10-CM | POA: Diagnosis not present

## 2018-10-02 MED ORDER — SERTRALINE HCL 50 MG PO TABS: 50.0000 mg | ORAL_TABLET | Freq: Every day | ORAL | 1 refills | Status: DC

## 2018-10-02 NOTE — Progress Notes (Signed)
BH MD OP Progress Note  10/02/2018 12:46 PM Kim Harding  MRN:  [id number]  Chief Complaint: ' I am here for follow up.' Chief Complaint    Follow-up; Medication Refill     HPI: Kim Harding is a 74 year old African-American female, widowed, lives in [place], has a history of depression, diabetes melitis, hypertension, hyperlipidemia, OSA on CPAP, tobacco use disorder, presented to clinic today for a follow-up visit.  Patient today reports she is currently struggling with allergic rhinitis.  She reports she is currently on medications for the same.  She however reports the past few days she has been spending a lot of time in bed because of the same.  She reports she is tolerating the Zoloft well.  She reports she however continues to struggle with some lack of motivation and anhedonia.  She feels the Zoloft is helpful to some extent.  She is interested in a dosage increase.  Patient denies any suicidality, homicidality or perceptual disturbances.  Patient reports sleep continues to be restless.  She reports it is also because she does not have a good sleep hygiene.  She is working on setting a good bedtime for herself.  She does not want any medications at this time.  Patient continues to smoke cigarettes.  Patient denies any other concerns today. Visit Diagnosis:    ICD-10-CM   1. MDD (major depressive disorder), recurrent episode, moderate (HCC) F33.1 sertraline (ZOLOFT) 50 MG tablet  2. Tobacco use disorder F17.200   3. Insomnia due to mental condition F51.05     Past Psychiatric History: Reviewed past psychiatric history from my progress note on 05/14/2018.  Past Medical History:  Past Medical History:  Diagnosis Date  . Allergy   . Arthritis   . Chicken pox   . COPD (chronic obstructive pulmonary disease) (HCC)   . Depression   . Diabetes mellitus without complication (HCC)   . Diabetes mellitus, type II (HCC)   . Diverticulitis   . Hyperlipidemia   . Hypertension   .  Schizophrenia [facility])     Past Surgical History:  Procedure Laterality Date  . ABDOMINAL HYSTERECTOMY  1994   partial  . BARIATRIC SURGERY     sleeve--2014  . CARPAL TUNNEL RELEASE    . CHOLECYSTECTOMY  2012  . REPLACEMENT TOTAL KNEE BILATERAL    . ROTATOR CUFF REPAIR Right 11/21/2015  . SHOULDER SURGERY Left 09/2014   rotator cuff  . TONSILLECTOMY  1960    Family Psychiatric History: Reviewed family psychiatric history from my progress note on 05/14/2018.  Family History:  Family History  Problem Relation Age of Onset  . Heart disease Mother   . Arthritis Father   . Cancer Father        Prostate  . Diabetes Sister   . Drug abuse Sister   . Diabetes Brother   . Drug abuse Brother   . Alcohol abuse Brother   . Drug abuse Brother   . Breast cancer Paternal Aunt     Social History: I have reviewed social history from my progress note on 05/14/2018. Social History   Socioeconomic History  . Marital status: Widowed    Spouse name: Not on file  . Number of children: 3  . Years of education: Not on file  . Highest education level: High school graduate  Occupational History  . Occupation: retired  Engineer, production  . Financial resource strain: Not hard at all  . Food insecurity:    Worry: Never true  Inability: Never true  . Transportation needs:    Medical: No    Non-medical: No  Tobacco Use  . Smoking status: Current Every Day Smoker    Packs/day: 1.00    Years: 49.00    Pack years: 49.00    Types: Cigarettes  . Smokeless tobacco: Never Used  Substance and Sexual Activity  . Alcohol use: No    Alcohol/week: 0.0 standard drinks  . Drug use: No  . Sexual activity: Not Currently  Lifestyle  . Physical activity:    Days per week: 3 days    Minutes per session: 60 min  . Stress: Rather much  Relationships  . Social connections:    Talks on phone: Not on file    Gets together: Not on file    Attends religious service: More than 4 times per year    Active  member of club or organization: No    Attends meetings of clubs or organizations: Never    Relationship status: Widowed  Other Topics Concern  . Not on file  Social History Narrative   Church activities, [place]    Allergies:  Allergies  Allergen Reactions  . Aspirin Hives  . Darvon [Propoxyphene] Hives  . Latex Hives, Itching and Swelling  . Naproxen Hives and Itching    Metabolic Disorder Labs: Lab Results  Component Value Date   HGBA1C 6.3 (H) 02/06/2018   MPG 134 02/06/2018   No results found for: PROLACTIN Lab Results  Component Value Date   CHOL 146 02/06/2018   TRIG 146 02/06/2018   HDL 42 (L) 02/06/2018   CHOLHDL 3.5 02/06/2018   VLDL 82.6 02/04/2017   LDLCALC 79 02/06/2018   LDLCALC 72 02/04/2017   Lab Results  Component Value Date   TSH 3.940 07/31/2018    Therapeutic Level Labs: No results found for: LITHIUM No results found for: VALPROATE No components found for:  CBMZ  Current Medications: Current Outpatient Medications  Medication Sig Dispense Refill  . albuterol (PROVENTIL HFA;VENTOLIN HFA) 108 (90 Base) MCG/ACT inhaler Inhale 2 puffs into the lungs every 6 (six) hours as needed for wheezing or shortness of breath. 6.7 Inhaler 3  . Cholecalciferol (VITAMIN D-3) 1000 UNITS CAPS Take 1 capsule by mouth daily.    Marland Kitchen esomeprazole (NEXIUM) 40 MG capsule TAKE 1 CAPSULE BY MOUTH  DAILY AT 12 NOON 90 capsule 2  . fluticasone (FLONASE) 50 MCG/ACT nasal spray Place 2 sprays into both nostrils daily. 16 g 6  . hydrochlorothiazide (HYDRODIURIL) 25 MG tablet Take 1 tablet (25 mg total) by mouth daily. 90 tablet 2  . metFORMIN (GLUCOPHAGE) 500 MG tablet Take 1 tablet (500 mg total) by mouth 2 (two) times daily with a meal. 180 tablet 2  . metoprolol succinate (TOPROL-XL) 100 MG 24 hr tablet     . Potassium Chloride ER 20 MEQ TBCR     . rosuvastatin (CRESTOR) 10 MG tablet     . sertraline (ZOLOFT) 50 MG tablet Take 1 tablet (50 mg total) by mouth daily. 30  tablet 1  . Spacer/Aero Chamber Mouthpiece MISC 1 Units by Does not apply route as needed. 1 each 0  . Spacer/Aero-Holding Chambers (OPTICHAMBER DIAMOND-LG MASK) DEVI See admin instructions. use with inhaler  0   No current facility-administered medications for this visit.      Musculoskeletal: Strength & Muscle Tone: within normal limits Gait & Station: normal Patient leans: N/A  Psychiatric Specialty Exam: Review of Systems  Respiratory: Positive for cough.  Psychiatric/Behavioral: Positive for depression. The patient has insomnia.   All other systems reviewed and are negative.   Blood pressure 125/73, pulse 69, temperature 99.5 F (37.5 C), temperature source Oral, weight 244 lb 6.4 oz (110.9 kg).Body mass index is 39.45 kg/m.  General Appearance: Casual  Eye Contact:  Fair  Speech:  Normal Rate  Volume:  Normal  Mood:  Dysphoric  Affect:  Appropriate  Thought Process:  Goal Directed and Descriptions of Associations: Intact  Orientation:  Full (Time, Place, and Person)  Thought Content: Logical   Suicidal Thoughts:  No  Homicidal Thoughts:  No  Memory:  Immediate;   Fair Recent;   Fair Remote;   Fair  Judgement:  Fair  Insight:  Fair  Psychomotor Activity:  Normal  Concentration:  Concentration: Fair and Attention Span: Fair  Recall:  [employer]  Fund of Knowledge: Fair  Language: Fair  Akathisia:  No  Handed:  Right  AIMS (if indicated): denies tremors, rigidity  Assets:  Communication Skills Desire for Improvement Social Support  ADL's:  Intact  Cognition: WNL  Sleep:  restless on and off   Screenings: PHQ2-9     [facility]13/2015 [facility]  PHQ-2 Total Score  0  1   0  0  0  PHQ-9 Total Score  -  4  -  -  -       Assessment and Plan: Kim Harding is a 73 year old African-American female, widowed, lives in [place], has a history of depression, tobacco use disorder, OSA on CPAP, hypertension, hyperlipidemia, diabetes melitis, presented to clinic today for a follow-up visit.  Patient is biologically predisposed given her family history of mental health problems.  Patient also has a history of trauma.  Patient has psychosocial stressors of being widowed.  Patient continues to struggle with her anxiety and depressive symptoms.  Continue to make medication changes.  Plan MDD- some improvement Increase Zoloft to 50 mg p.o. daily Continue psychotherapy sessions with Ms. Peacock.  For tobacco use disorder-unstable Patient failed Chantix, did not tolerate Wellbutrin.  We will continue to provide smoking cessation counseling.  For insomnia- unstable Patient reports she does not want any medication and wants to continue to work on her sleep hygiene techniques.  Follow-up in clinic in 4 weeks or sooner if needed.  I have spent atleast 15 minutes face to face with patient today. More than 50 % of the time was spent for psychoeducation and supportive psychotherapy and care coordination.  This note was generated in part or whole with voice recognition software. Voice recognition is usually quite accurate but there are transcription errors that can and very often do occur. I apologize for any typographical errors that were not detected and corrected.        Jomarie LongsSaramma Empress Newmann, MD 10/02/2018, 12:46 PM

## 2018-10-05 ENCOUNTER — Ambulatory Visit: Payer: Medicare HMO | Admitting: Internal Medicine

## 2018-10-07 DIAGNOSIS — E782 Mixed hyperlipidemia: Secondary | ICD-10-CM | POA: Diagnosis not present

## 2018-10-07 DIAGNOSIS — R05 Cough: Secondary | ICD-10-CM | POA: Diagnosis not present

## 2018-10-07 DIAGNOSIS — B379 Candidiasis, unspecified: Secondary | ICD-10-CM | POA: Diagnosis not present

## 2018-10-07 DIAGNOSIS — E119 Type 2 diabetes mellitus without complications: Secondary | ICD-10-CM | POA: Diagnosis not present

## 2018-10-07 DIAGNOSIS — J0141 Acute recurrent pansinusitis: Secondary | ICD-10-CM | POA: Diagnosis not present

## 2018-10-07 DIAGNOSIS — T3695XA Adverse effect of unspecified systemic antibiotic, initial encounter: Secondary | ICD-10-CM | POA: Diagnosis not present

## 2018-10-07 DIAGNOSIS — I10 Essential (primary) hypertension: Secondary | ICD-10-CM | POA: Diagnosis not present

## 2018-10-10 ENCOUNTER — Other Ambulatory Visit: Payer: Self-pay | Admitting: Internal Medicine

## 2018-10-12 NOTE — Telephone Encounter (Signed)
This has not been filled since 05-2017... please advise

## 2018-10-22 DIAGNOSIS — G4733 Obstructive sleep apnea (adult) (pediatric): Secondary | ICD-10-CM | POA: Diagnosis not present

## 2018-10-27 ENCOUNTER — Ambulatory Visit (INDEPENDENT_AMBULATORY_CARE_PROVIDER_SITE_OTHER): Payer: Medicare HMO | Admitting: Psychiatry

## 2018-10-27 ENCOUNTER — Ambulatory Visit (INDEPENDENT_AMBULATORY_CARE_PROVIDER_SITE_OTHER): Payer: Medicare HMO | Admitting: Licensed Clinical Social Worker

## 2018-10-27 ENCOUNTER — Other Ambulatory Visit: Payer: Self-pay

## 2018-10-27 ENCOUNTER — Ambulatory Visit: Payer: Medicare HMO | Admitting: Psychiatry

## 2018-10-27 ENCOUNTER — Encounter: Payer: Self-pay | Admitting: Psychiatry

## 2018-10-27 DIAGNOSIS — F5105 Insomnia due to other mental disorder: Secondary | ICD-10-CM | POA: Diagnosis not present

## 2018-10-27 DIAGNOSIS — F17200 Nicotine dependence, unspecified, uncomplicated: Secondary | ICD-10-CM | POA: Diagnosis not present

## 2018-10-27 DIAGNOSIS — F331 Major depressive disorder, recurrent, moderate: Secondary | ICD-10-CM | POA: Diagnosis not present

## 2018-10-27 DIAGNOSIS — R69 Illness, unspecified: Secondary | ICD-10-CM | POA: Diagnosis not present

## 2018-10-27 MED ORDER — SERTRALINE HCL 50 MG PO TABS: 50.0000 mg | ORAL_TABLET | Freq: Every day | ORAL | 0 refills | Status: DC

## 2018-10-27 NOTE — Progress Notes (Signed)
Virtual Visit via Telephone Note  I connected with Kim Harding on 10/27/18 at  1:20 PM EDT by telephone and verified that I am speaking with the correct person using two identifiers.   I discussed the limitations, risks, security and privacy concerns of performing an evaluation and management service by telephone and the availability of in person appointments. I also discussed with the patient that there may be a patient responsible charge related to this service. The patient expressed understanding and agreed to proceed.   I discussed the assessment and treatment plan with the patient. The patient was provided an opportunity to ask questions and all were answered. The patient agreed with the plan and demonstrated an understanding of the instructions.   The patient was advised to call back or seek an in-person evaluation if the symptoms worsen or if the condition fails to improve as anticipated.  I provided 15 minutes of non-face-to-face time during this encounter.   Kim LongsSaramma Omair Dettmer, MD  BH MD OP Progress Note  10/27/2018 2:53 PM [name]  MRN:  [id number]  Chief Complaint:  Chief Complaint    Follow-up     HPI: Kim CureJosie is a 73 year old African-American female, widowed, lives in [place], has a history of depression, diabetes melitis, hypertension, hyperlipidemia, OSA on CPAP, insomnia, tobacco use disorder was evaluated by phone today.  Patient today reports she is currently coping well with the coronavirus outbreak.  She reports she struggles with having to stay inside a lot however so far she has been doing okay.  She reports she has good social support from her daughter who stays home and works.  Patient reports she does not have any significant depression or anxiety symptoms at this time.  Her sleep has improved since her last visit here.  She is tolerating the Zoloft well.  She denies any side effects.  Patient denies any suicidality, homicidality or perceptual  disturbances.  Patient reports she is trying to cut down smoking and is currently using Nicorette lozenges.  Patient denies any other concerns today. Visit Diagnosis:    ICD-10-CM   1. MDD (major depressive disorder), recurrent episode, moderate (HCC) F33.1 sertraline (ZOLOFT) 50 MG tablet  2. Tobacco use disorder F17.200   3. Insomnia due to mental condition F51.05     Past Psychiatric History: I have reviewed past psychiatric history from my progress note on 05/14/2018.  Past Medical History:  Past Medical History:  Diagnosis Date  . Allergy   . Arthritis   . Chicken pox   . COPD (chronic obstructive pulmonary disease) (HCC)   . Depression   . Diabetes mellitus without complication (HCC)   . Diabetes mellitus, type II (HCC)   . Diverticulitis   . Hyperlipidemia   . Hypertension   . Schizophrenia [facility](HCC)     Past Surgical History:  Procedure Laterality Date  . ABDOMINAL HYSTERECTOMY  1994   partial  . BARIATRIC SURGERY     sleeve--2014  . CARPAL TUNNEL RELEASE    . CHOLECYSTECTOMY  2012  . REPLACEMENT TOTAL KNEE BILATERAL    . ROTATOR CUFF REPAIR Right 11/21/2015  . SHOULDER SURGERY Left 09/2014   rotator cuff  . TONSILLECTOMY  1960    Family Psychiatric History: I have reviewed family psychiatric history from my progress note on 05/14/2018.  Family History:  Family History  Problem Relation Age of Onset  . Heart disease Mother   . Arthritis Father   . Cancer Father        Prostate  .  Diabetes Sister   . Drug abuse Sister   . Diabetes Brother   . Drug abuse Brother   . Alcohol abuse Brother   . Drug abuse Brother   . Breast cancer Paternal Aunt     Social History: Reviewed social history from my progress note on 05/14/2018. Social History   Socioeconomic History  . Marital status: Widowed    Spouse name: Not on file  . Number of children: 3  . Years of education: Not on file  . Highest education level: High school graduate  Occupational History  .  Occupation: retired  Engineer, production  . Financial resource strain: Not hard at all  . Food insecurity:    Worry: Never true    Inability: Never true  . Transportation needs:    Medical: No    Non-medical: No  Tobacco Use  . Smoking status: Current Every Day Smoker    Packs/day: 1.00    Years: 49.00    Pack years: 49.00    Types: Cigarettes  . Smokeless tobacco: Never Used  Substance and Sexual Activity  . Alcohol use: No    Alcohol/week: 0.0 standard drinks  . Drug use: No  . Sexual activity: Not Currently  Lifestyle  . Physical activity:    Days per week: 3 days    Minutes per session: 60 min  . Stress: Rather much  Relationships  . Social connections:    Talks on phone: Not on file    Gets together: Not on file    Attends religious service: More than 4 times per year    Active member of club or organization: No    Attends meetings of clubs or organizations: Never    Relationship status: Widowed  Other Topics Concern  . Not on file  Social History Narrative   Church activities, [place]    Allergies:  Allergies  Allergen Reactions  . Aspirin Hives  . Darvon [Propoxyphene] Hives  . Latex Hives, Itching and Swelling  . Naproxen Hives and Itching    Metabolic Disorder Labs: Lab Results  Component Value Date   HGBA1C 6.3 (H) 02/06/2018   MPG 134 02/06/2018   No results found for: PROLACTIN Lab Results  Component Value Date   CHOL 146 02/06/2018   TRIG 146 02/06/2018   HDL 42 (L) 02/06/2018   CHOLHDL 3.5 02/06/2018   VLDL 02.7 02/04/2017   LDLCALC 79 02/06/2018   LDLCALC 72 02/04/2017   Lab Results  Component Value Date   TSH 3.940 07/31/2018    Therapeutic Level Labs: No results found for: LITHIUM No results found for: VALPROATE No components found for:  CBMZ  Current Medications: Current Outpatient Medications  Medication Sig Dispense Refill  . albuterol (PROVENTIL HFA;VENTOLIN HFA) 108 (90 Base) MCG/ACT inhaler Inhale 2 puffs into the  lungs every 6 (six) hours as needed for wheezing or shortness of breath. 6.7 Inhaler 3  . Cholecalciferol (VITAMIN D-3) 1000 UNITS CAPS Take 1 capsule by mouth daily.    Marland Kitchen esomeprazole (NEXIUM) 40 MG capsule TAKE 1 CAPSULE BY MOUTH  DAILY AT 12 NOON 90 capsule 2  . fluticasone (FLONASE) 50 MCG/ACT nasal spray Place 2 sprays into both nostrils daily. 16 g 6  . hydrochlorothiazide (HYDRODIURIL) 25 MG tablet Take 1 tablet (25 mg total) by mouth daily. 90 tablet 2  . metFORMIN (GLUCOPHAGE) 500 MG tablet Take 1 tablet (500 mg total) by mouth 2 (two) times daily with a meal. 180 tablet 2  . metoprolol succinate (  TOPROL-XL) 100 MG 24 hr tablet     . Potassium Chloride ER 20 MEQ TBCR     . potassium chloride SA (K-DUR,KLOR-CON) 20 MEQ tablet TAKE 1 TABLET BY MOUTH  DAILY 90 tablet 0  . rosuvastatin (CRESTOR) 10 MG tablet     . sertraline (ZOLOFT) 50 MG tablet Take 1 tablet (50 mg total) by mouth daily. 90 tablet 0  . Spacer/Aero Chamber Mouthpiece MISC 1 Units by Does not apply route as needed. 1 each 0  . Spacer/Aero-Holding Chambers (OPTICHAMBER DIAMOND-LG MASK) DEVI See admin instructions. use with inhaler  0   No current facility-administered medications for this visit.      Musculoskeletal: Strength & Muscle Tone: Unable to assess Gait & Station: Unable to assess Patient leans: N/A  Psychiatric Specialty Exam: Review of Systems  Psychiatric/Behavioral: The patient is nervous/anxious.   All other systems reviewed and are negative.   There were no vitals taken for this visit.There is no height or weight on file to calculate BMI.  General Appearance: Unable to assess  Eye Contact:  Unable to assess  Speech:  Clear and Coherent  Volume:  Normal  Mood:  Anxious  Affect:  Unable to assess  Thought Process:  Goal Directed and Descriptions of Associations: Intact  Orientation:  Full (Time, Place, and Person)  Thought Content: Logical   Suicidal Thoughts:  No  Homicidal Thoughts:  No   Memory:  Immediate;   Fair Recent;   Fair Remote;   Fair  Judgement:  Fair  Insight:  Fair  Psychomotor Activity:  Unable to assess  Concentration:  Concentration: Fair and Attention Span: Fair  Recall:  [employer] of Knowledge: Fair  Language: Fair  Akathisia:  No  Handed:  Right  AIMS (if indicated): UTA  Assets:  Communication Skills Desire for Improvement Housing Transportation  ADL's:  Intact  Cognition: WNL  Sleep:  Improving   Screenings: PHQ2-9     [facility] Visit from 02/06/2018 in [facility] Visit from 02/04/2017 in [facility] Visit from 12/13/2015 in [facility] Visit from 02/07/2015 in [facility] Visit from 08/09/2014 in [facility]  PHQ-2 Total Score  0  1  0  0  0  PHQ-9 Total Score  -  4  -  -  -       Assessment and Plan: Delijah is a 73 year old African-American female, widowed, lives in [place], has a history of depression, tobacco use disorder, OSA on CPAP, hypertension, hyperlipidemia, diabetes melitis was evaluated by phone today.  Patient is biologically predisposed given her family history of mental health problems.  Patient also has a history of trauma.  Patient has psychosocial stressors of current COVID-19 crisis.  Patient however is coping okay and is currently doing okay on the medications.  Discussed with her to continue psychotherapy sessions.  Plan as noted below.  Plan MDD-improving Zoloft to 50 mg p.o. daily Continue CBT with Ms. Peacock.  For insomnia-improving She will continue to make use of sleep hygiene techniques.  For tobacco use disorder- improving She will make use of nicotine lozenges. Provided smoking cessation counseling.  Follow-up in clinic in 2 months or sooner if needed.  I have spent atleast 15 minutes non face to face with patient today. More than 50 % of the time was spent for  psychoeducation and supportive psychotherapy and care coordination.  This note  was generated in part or whole with voice recognition software. Voice recognition is usually quite accurate but there are transcription errors that can and very often do occur. I apologize for any typographical errors that were not detected and corrected.       Kim Longs, MD 10/27/2018, 2:53 PM

## 2018-11-22 ENCOUNTER — Other Ambulatory Visit: Payer: Self-pay | Admitting: Internal Medicine

## 2018-11-30 NOTE — Progress Notes (Signed)
   THERAPIST PROGRESS NOTE  Session Time: 60 min  Participation Level: Active  Behavioral Response: CasualAlertEuthymic  Type of Therapy: Individual Therapy  Treatment Goals addressed: Coping  Interventions: CBT and Motivational Interviewing  Summary: Kim Harding is a 72 y.o. female who presents with continued symptoms of her diagnosis.  Therapist facilitated a discussion on "managing conflict." Therapist discussed with Patient the importance of using decision making skills in the process of daily conflicts and situations. Therapist discussed the importance of conflict management is found in the realities of everyday living.  Therapist assisted Patient with defining conflict and management.  Therapist assisted with processing the skill components: 1. list your choices 2. list your consequences. 3. decide on the choice that has the least negative consequence. 4. Ask yourself if that choice is fair and conscientious.       Suicidal/Homicidal: No  Plan: Return again in 2 weeks.  Diagnosis: Axis I: Depression    Axis II: No Diagnosis    Marinda Elk, LCSW 09/30/2018

## 2018-12-05 NOTE — Progress Notes (Signed)
Virtual Visit via Telephone Note  I connected with Winnell Prentiss on 10/27/18 at  9:00 AM EDT by telephone and verified that I am speaking with the correct person using two identifiers.  Location: Patient: home Provider: [facility]   I discussed the limitations, risks, security and privacy concerns of performing an evaluation and management service by telephone and the availability of in person appointments. I also discussed with the patient that there may be a patient responsible charge related to this service. The patient expressed understanding and agreed to proceed.   History of Present Illness: Depression    Observations/Objective: Patient reports difficulty with isolation and inability to obtain needed items.   Assessment and Plan: Utilize coping skills   Follow Up Instructions: 1 week    I discussed the assessment and treatment plan with the patient. The patient was provided an opportunity to ask questions and all were answered. The patient agreed with the plan and demonstrated an understanding of the instructions.   The patient was advised to call back or seek an in-person evaluation if the symptoms worsen or if the condition fails to improve as anticipated.  I provided 30 minutes of non-face-to-face time during this encounter.   Marinda Elk, LCSW

## 2018-12-11 ENCOUNTER — Other Ambulatory Visit: Payer: Self-pay | Admitting: Internal Medicine

## 2018-12-24 DIAGNOSIS — M722 Plantar fascial fibromatosis: Secondary | ICD-10-CM | POA: Diagnosis not present

## 2018-12-24 DIAGNOSIS — B070 Plantar wart: Secondary | ICD-10-CM | POA: Diagnosis not present

## 2018-12-24 DIAGNOSIS — M79671 Pain in right foot: Secondary | ICD-10-CM | POA: Diagnosis not present

## 2018-12-26 DIAGNOSIS — M19041 Primary osteoarthritis, right hand: Secondary | ICD-10-CM | POA: Diagnosis not present

## 2018-12-26 DIAGNOSIS — R69 Illness, unspecified: Secondary | ICD-10-CM | POA: Diagnosis not present

## 2018-12-26 DIAGNOSIS — E785 Hyperlipidemia, unspecified: Secondary | ICD-10-CM | POA: Diagnosis not present

## 2018-12-26 DIAGNOSIS — J439 Emphysema, unspecified: Secondary | ICD-10-CM | POA: Diagnosis not present

## 2018-12-26 DIAGNOSIS — I10 Essential (primary) hypertension: Secondary | ICD-10-CM | POA: Diagnosis not present

## 2018-12-26 DIAGNOSIS — E559 Vitamin D deficiency, unspecified: Secondary | ICD-10-CM | POA: Diagnosis not present

## 2018-12-26 DIAGNOSIS — E1136 Type 2 diabetes mellitus with diabetic cataract: Secondary | ICD-10-CM | POA: Diagnosis not present

## 2018-12-26 DIAGNOSIS — G4733 Obstructive sleep apnea (adult) (pediatric): Secondary | ICD-10-CM | POA: Diagnosis not present

## 2018-12-26 DIAGNOSIS — K219 Gastro-esophageal reflux disease without esophagitis: Secondary | ICD-10-CM | POA: Diagnosis not present

## 2018-12-29 ENCOUNTER — Ambulatory Visit (INDEPENDENT_AMBULATORY_CARE_PROVIDER_SITE_OTHER): Payer: Medicare HMO | Admitting: Psychiatry

## 2018-12-29 ENCOUNTER — Encounter: Payer: Self-pay | Admitting: Psychiatry

## 2018-12-29 ENCOUNTER — Other Ambulatory Visit: Payer: Self-pay

## 2018-12-29 DIAGNOSIS — F331 Major depressive disorder, recurrent, moderate: Secondary | ICD-10-CM

## 2018-12-29 DIAGNOSIS — F17200 Nicotine dependence, unspecified, uncomplicated: Secondary | ICD-10-CM

## 2018-12-29 DIAGNOSIS — F5105 Insomnia due to other mental disorder: Secondary | ICD-10-CM | POA: Diagnosis not present

## 2018-12-29 DIAGNOSIS — R69 Illness, unspecified: Secondary | ICD-10-CM | POA: Diagnosis not present

## 2018-12-29 MED ORDER — SERTRALINE HCL 50 MG PO TABS: 50.0000 mg | ORAL_TABLET | Freq: Every day | ORAL | 0 refills | Status: DC

## 2018-12-29 NOTE — Progress Notes (Signed)
Virtual Visit via Video Note  I connected with Kim Harding on 12/29/18 at  9:30 AM EDT by a video enabled telemedicine application and verified that I am speaking with the correct person using two identifiers.   I discussed the limitations of evaluation and management by telemedicine and the availability of in person appointments. The patient expressed understanding and agreed to proceed.    I discussed the assessment and treatment plan with the patient. The patient was provided an opportunity to ask questions and all were answered. The patient agreed with the plan and demonstrated an understanding of the instructions.   The patient was advised to call back or seek an in-person evaluation if the symptoms worsen or if the condition fails to improve as anticipated.   BH MD OP Progress Note  12/29/2018 10:44 AM Alleen BorneJosie Ray  MRN:  [id number]  Chief Complaint:  Chief Complaint    Follow-up     HPI: Kim Harding is a 73 year old African-American female, widowed, lives in [place], has a history of MDD, diabetes melitis, hypertension, hyperlipidemia, OSA on CPAP, insomnia, tobacco use disorder was evaluated by telemedicine today.  She reports she is kind of frustrated due to the current COVID-19 outbreak.  She reports she is tired of staying home all day.  She however has been making use of precautions and wearing a mask when she has to go out.  She reports she has been compliant with her Zoloft as prescribed.  She denies any side effects.  She describes her mood as okay on the current medication regimen.  Patient denies any suicidality, homicidality or perceptual disturbances.  She reports sleep and appetite is fair.  She continues to cut back on smoking cigarettes and doing a good job.  Patient denies any other concerns today. Visit Diagnosis:    ICD-10-CM   1. MDD (major depressive disorder), recurrent episode, moderate (HCC) F33.1 sertraline (ZOLOFT) 50 MG tablet   improving  2.  Insomnia due to mental condition F51.05    improving  3. Tobacco use disorder F17.200     Past Psychiatric History: I have reviewed past psychiatric history from my progress note on 05/14/2018.  Past Medical History:  Past Medical History:  Diagnosis Date  . Allergy   . Arthritis   . Chicken pox   . COPD (chronic obstructive pulmonary disease) (HCC)   . Depression   . Diabetes mellitus without complication (HCC)   . Diabetes mellitus, type II (HCC)   . Diverticulitis   . Hyperlipidemia   . Hypertension   . Schizophrenia [facility](HCC)     Past Surgical History:  Procedure Laterality Date  . ABDOMINAL HYSTERECTOMY  1994   partial  . BARIATRIC SURGERY     sleeve--2014  . CARPAL TUNNEL RELEASE    . CHOLECYSTECTOMY  2012  . REPLACEMENT TOTAL KNEE BILATERAL    . ROTATOR CUFF REPAIR Right 11/21/2015  . SHOULDER SURGERY Left 09/2014   rotator cuff  . TONSILLECTOMY  1960    Family Psychiatric History: Reviewed family psychiatric history from my progress note on 05/14/2018.  Family History:  Family History  Problem Relation Age of Onset  . Heart disease Mother   . Arthritis Father   . Cancer Father        Prostate  . Diabetes Sister   . Drug abuse Sister   . Diabetes Brother   . Drug abuse Brother   . Alcohol abuse Brother   . Drug abuse Brother   . Breast cancer  Paternal Aunt     Social History: Reviewed social history from my progress note on 05/14/2018. Social History   Socioeconomic History  . Marital status: Widowed    Spouse name: Not on file  . Number of children: 3  . Years of education: Not on file  . Highest education level: High school graduate  Occupational History  . Occupation: retired  Scientific laboratory technician  . Financial resource strain: Not hard at all  . Food insecurity:    Worry: Never true    Inability: Never true  . Transportation needs:    Medical: No    Non-medical: No  Tobacco Use  . Smoking status: Current Every Day Smoker    Packs/day: 1.00     Years: 49.00    Pack years: 49.00    Types: Cigarettes  . Smokeless tobacco: Never Used  Substance and Sexual Activity  . Alcohol use: No    Alcohol/week: 0.0 standard drinks  . Drug use: No  . Sexual activity: Not Currently  Lifestyle  . Physical activity:    Days per week: 3 days    Minutes per session: 60 min  . Stress: Rather much  Relationships  . Social connections:    Talks on phone: Not on file    Gets together: Not on file    Attends religious service: More than 4 times per year    Active member of club or organization: No    Attends meetings of clubs or organizations: Never    Relationship status: Widowed  Other Topics Concern  . Not on file  Social History Narrative   Church activities, [place]    Allergies:  Allergies  Allergen Reactions  . Aspirin Hives  . Darvon [Propoxyphene] Hives  . Latex Hives, Itching and Swelling  . Naproxen Hives and Itching    Metabolic Disorder Labs: Lab Results  Component Value Date   HGBA1C 6.3 (H) 02/06/2018   MPG 134 02/06/2018   No results found for: PROLACTIN Lab Results  Component Value Date   CHOL 146 02/06/2018   TRIG 146 02/06/2018   HDL 42 (L) 02/06/2018   CHOLHDL 3.5 02/06/2018   VLDL 19.8 02/04/2017   LDLCALC 79 02/06/2018   LDLCALC 72 02/04/2017   Lab Results  Component Value Date   TSH 3.940 07/31/2018    Therapeutic Level Labs: No results found for: LITHIUM No results found for: VALPROATE No components found for:  CBMZ  Current Medications: Current Outpatient Medications  Medication Sig Dispense Refill  . albuterol (PROVENTIL HFA;VENTOLIN HFA) 108 (90 Base) MCG/ACT inhaler Inhale 2 puffs into the lungs every 6 (six) hours as needed for wheezing or shortness of breath. 6.7 Inhaler 3  . Cholecalciferol (VITAMIN D-3) 1000 UNITS CAPS Take 1 capsule by mouth daily.    Marland Kitchen esomeprazole (NEXIUM) 40 MG capsule TAKE 1 CAPSULE BY MOUTH  DAILY AT 12 NOON 90 capsule 0  . fluticasone (FLONASE) 50  MCG/ACT nasal spray Place 2 sprays into both nostrils daily. 16 g 6  . hydrochlorothiazide (HYDRODIURIL) 25 MG tablet TAKE 1 TABLET BY MOUTH  DAILY 90 tablet 0  . metFORMIN (GLUCOPHAGE) 500 MG tablet Take 1 tablet (500 mg total) by mouth 2 (two) times daily with a meal. 180 tablet 2  . metoprolol succinate (TOPROL-XL) 100 MG 24 hr tablet     . Potassium Chloride ER 20 MEQ TBCR     . potassium chloride SA (K-DUR,KLOR-CON) 20 MEQ tablet TAKE 1 TABLET BY MOUTH  DAILY 90 tablet  0  . rosuvastatin (CRESTOR) 10 MG tablet TAKE 1 TABLET BY MOUTH  DAILY 90 tablet 0  . sertraline (ZOLOFT) 50 MG tablet Take 1 tablet (50 mg total) by mouth daily. 90 tablet 0  . Spacer/Aero Chamber Mouthpiece MISC 1 Units by Does not apply route as needed. 1 each 0  . Spacer/Aero-Holding Chambers (OPTICHAMBER DIAMOND-LG MASK) DEVI See admin instructions. use with inhaler  0   No current facility-administered medications for this visit.      Musculoskeletal: Strength & Muscle Tone: within normal limits Gait & Station: normal Patient leans: N/A  Psychiatric Specialty Exam: Review of Systems  Psychiatric/Behavioral: The patient is nervous/anxious.   All other systems reviewed and are negative.   There were no vitals taken for this visit.There is no height or weight on file to calculate BMI.  General Appearance: Casual  Eye Contact:  Good  Speech:  Normal Rate  Volume:  Normal  Mood:  Anxious  Affect:  Congruent  Thought Process:  Goal Directed and Descriptions of Associations: Intact  Orientation:  Full (Time, Place, and Person)  Thought Content: Logical   Suicidal Thoughts:  No  Homicidal Thoughts:  No  Memory:  Immediate;   Fair Recent;   Fair Remote;   Fair  Judgement:  Fair  Insight:  Fair  Psychomotor Activity:  Normal  Concentration:  Concentration: Fair and Attention Span: Fair  Recall:  [employer]Fair  Fund of Knowledge: Fair  Language: Fair  Akathisia:  No  Handed:  Right  AIMS (if indicated): denies  tremors, rigidity  Assets:  Communication Skills Desire for Improvement Housing Social Support  ADL's:  Intact  Cognition: WNL  Sleep:  Fair   Screenings: PHQ2-9     [facility] Visit from [facility]/2017 [facility]  PHQ-2 Total Score  0  1  0  0  0  PHQ-9 Total Score  -  4  -  -  -       Assessment and Plan: Kim Harding is a 73 year old African-American female, widowed, lives in [place], has a history of depression, tobacco use disorder, OSA on CPAP, hypertension, hyperlipidemia, diabetes melitis was evaluated by telemedicine today.  Patient is biologically predisposed given her family history of mental health problems.  Patient also has a history of trauma.  Patient has psychosocial stressors of current COVID-19 outbreak however has been coping okay.  She is currently compliant with medications and is currently making progress.  Plan MDD-improving Zoloft 50 mg p.o. daily Continue CBT with Ms. Peacock  For insomnia-improving She will continue to make her sleep hygiene techniques.  For tobacco use disorder-improving She will continue to make use of nicotine lozenges. She is able to cut back.  Follow-up in clinic in 3 months or sooner if needed.  September 2 at 10 AM.  I have spent atleast 15 minutes non face to face with patient today. More than 50 % of the time was spent for psychoeducation and supportive psychotherapy and care coordination.  This note was generated in part or whole with voice recognition software. Voice recognition is usually quite accurate but there are transcription errors that can and very often do occur. I apologize for any typographical errors that were not detected and  corrected.  Jomarie LongsSaramma Harald Quevedo, MD 12/29/2018, 10:44 AM

## 2019-01-02 ENCOUNTER — Other Ambulatory Visit: Payer: Self-pay | Admitting: Internal Medicine

## 2019-01-04 NOTE — Telephone Encounter (Signed)
Please advise if pt is to continue this medication

## 2019-01-12 ENCOUNTER — Other Ambulatory Visit: Payer: Self-pay | Admitting: Internal Medicine

## 2019-01-12 DIAGNOSIS — I119 Hypertensive heart disease without heart failure: Secondary | ICD-10-CM | POA: Diagnosis not present

## 2019-01-12 DIAGNOSIS — E782 Mixed hyperlipidemia: Secondary | ICD-10-CM | POA: Diagnosis not present

## 2019-01-12 DIAGNOSIS — I25118 Atherosclerotic heart disease of native coronary artery with other forms of angina pectoris: Secondary | ICD-10-CM | POA: Diagnosis not present

## 2019-01-12 DIAGNOSIS — I10 Essential (primary) hypertension: Secondary | ICD-10-CM | POA: Diagnosis not present

## 2019-01-12 DIAGNOSIS — I6523 Occlusion and stenosis of bilateral carotid arteries: Secondary | ICD-10-CM | POA: Diagnosis not present

## 2019-01-12 DIAGNOSIS — I2510 Atherosclerotic heart disease of native coronary artery without angina pectoris: Secondary | ICD-10-CM | POA: Diagnosis not present

## 2019-01-14 DIAGNOSIS — H2513 Age-related nuclear cataract, bilateral: Secondary | ICD-10-CM | POA: Diagnosis not present

## 2019-01-14 LAB — HM DIABETES EYE EXAM

## 2019-01-18 ENCOUNTER — Encounter: Payer: Self-pay | Admitting: Internal Medicine

## 2019-02-15 ENCOUNTER — Other Ambulatory Visit: Payer: Self-pay | Admitting: Internal Medicine

## 2019-02-18 ENCOUNTER — Encounter: Payer: Self-pay | Admitting: Internal Medicine

## 2019-02-18 ENCOUNTER — Other Ambulatory Visit: Payer: Self-pay

## 2019-02-18 ENCOUNTER — Ambulatory Visit: Payer: Medicare HMO | Admitting: Internal Medicine

## 2019-02-18 VITALS — BP 116/68 | HR 70 | Temp 97.9°F | Ht 64.5 in | Wt 250.0 lb

## 2019-02-18 DIAGNOSIS — G4733 Obstructive sleep apnea (adult) (pediatric): Secondary | ICD-10-CM | POA: Diagnosis not present

## 2019-02-18 DIAGNOSIS — F250 Schizoaffective disorder, bipolar type: Secondary | ICD-10-CM

## 2019-02-18 DIAGNOSIS — Z9989 Dependence on other enabling machines and devices: Secondary | ICD-10-CM

## 2019-02-18 DIAGNOSIS — E559 Vitamin D deficiency, unspecified: Secondary | ICD-10-CM

## 2019-02-18 DIAGNOSIS — E119 Type 2 diabetes mellitus without complications: Secondary | ICD-10-CM | POA: Diagnosis not present

## 2019-02-18 DIAGNOSIS — E7800 Pure hypercholesterolemia, unspecified: Secondary | ICD-10-CM | POA: Diagnosis not present

## 2019-02-18 DIAGNOSIS — M1990 Unspecified osteoarthritis, unspecified site: Secondary | ICD-10-CM | POA: Diagnosis not present

## 2019-02-18 DIAGNOSIS — I2510 Atherosclerotic heart disease of native coronary artery without angina pectoris: Secondary | ICD-10-CM | POA: Diagnosis not present

## 2019-02-18 DIAGNOSIS — Z0000 Encounter for general adult medical examination without abnormal findings: Secondary | ICD-10-CM | POA: Diagnosis not present

## 2019-02-18 DIAGNOSIS — I10 Essential (primary) hypertension: Secondary | ICD-10-CM | POA: Diagnosis not present

## 2019-02-18 DIAGNOSIS — K219 Gastro-esophageal reflux disease without esophagitis: Secondary | ICD-10-CM | POA: Diagnosis not present

## 2019-02-18 DIAGNOSIS — F259 Schizoaffective disorder, unspecified: Secondary | ICD-10-CM

## 2019-02-18 DIAGNOSIS — R69 Illness, unspecified: Secondary | ICD-10-CM | POA: Diagnosis not present

## 2019-02-18 DIAGNOSIS — F339 Major depressive disorder, recurrent, unspecified: Secondary | ICD-10-CM

## 2019-02-18 LAB — COMPREHENSIVE METABOLIC PANEL
ALT: 15 U/L (ref 0–35)
AST: 18 U/L (ref 0–37)
Albumin: 4.1 g/dL (ref 3.5–5.2)
Alkaline Phosphatase: 62 U/L (ref 39–117)
BUN: 9 mg/dL (ref 6–23)
CO2: 30 mEq/L (ref 19–32)
Calcium: 9.9 mg/dL (ref 8.4–10.5)
Chloride: 103 mEq/L (ref 96–112)
Creatinine, Ser: 0.8 mg/dL (ref 0.40–1.20)
GFR: 85.52 mL/min (ref >60.00)
Glucose, Bld: 102 mg/dL — ABNORMAL HIGH (ref 70–99)
Potassium: 3.8 mEq/L (ref 3.5–5.1)
Sodium: 140 mEq/L (ref 135–145)
Total Bilirubin: 0.6 mg/dL (ref 0.2–1.2)
Total Protein: 7.5 g/dL (ref 6.0–8.3)

## 2019-02-18 LAB — CBC
HCT: 42.2 % (ref 36.0–46.0)
Hemoglobin: 14.1 g/dL (ref 12.0–15.0)
MCHC: 33.5 g/dL (ref 30.0–36.0)
MCV: 88.8 fl (ref 78.0–100.0)
Platelets: 277 10*3/uL (ref 150.0–400.0)
RBC: 4.75 Mil/uL (ref 3.87–5.11)
RDW: 14.7 % (ref 11.5–15.5)
WBC: 10.3 10*3/uL (ref 4.0–10.5)

## 2019-02-18 LAB — LIPID PANEL
Cholesterol: 140 mg/dL (ref 0–200)
HDL: 42.9 mg/dL (ref >39.00)
LDL Cholesterol: 63 mg/dL (ref 0–99)
NonHDL: 96.62
Total CHOL/HDL Ratio: 3
Triglycerides: 168 mg/dL — ABNORMAL HIGH (ref 0.0–149.0)
VLDL: 33.6 mg/dL (ref 0.0–40.0)

## 2019-02-18 LAB — MICROALBUMIN / CREATININE URINE RATIO
Creatinine,U: 85 mg/dL
Microalb Creat Ratio: 0.8 mg/g (ref 0.0–30.0)
Microalb, Ur: <0.7 mg/dL (ref 0.0–1.9)

## 2019-02-18 LAB — VITAMIN D 25 HYDROXY (VIT D DEFICIENCY, FRACTURES): VITD: 40.37 ng/mL (ref 30.00–100.00)

## 2019-02-18 LAB — HEMOGLOBIN A1C: Hgb A1c MFr Bld: 6.5 % (ref 4.6–6.5)

## 2019-02-18 MED ORDER — ZOSTER VAC RECOMB ADJUVANTED 50 MCG/0.5ML IM SUSR: 0.5000 mL | Freq: Once | INTRAMUSCULAR | 0 refills | Status: AC

## 2019-02-18 NOTE — Assessment & Plan Note (Signed)
CMET and lipid profile today Encouraged her to consume a low fat diet Continue Rosuvastatin for now

## 2019-02-18 NOTE — Assessment & Plan Note (Signed)
Controlled on Metoprolol and HCTZ CMET today Reinforced DASH diet

## 2019-02-18 NOTE — Assessment & Plan Note (Addendum)
Encouraged regular physical activity and weight loss Continue Ibuprofen prn CMET today

## 2019-02-18 NOTE — Assessment & Plan Note (Signed)
A1C and urine microalbumin today Encouraged her to consume a low carb diet and exercise for weight loss Continue Metformin Foot exam today Encouraged yearly eye exam Flu shot UTD Pneumonia vaccines UTD

## 2019-02-18 NOTE — Patient Instructions (Signed)

## 2019-02-18 NOTE — Assessment & Plan Note (Signed)
Controlled of med She will continue to follow with psych Will monitor

## 2019-02-18 NOTE — Assessment & Plan Note (Signed)
Discussed avoiding foods that trigger reflux. Discussed how weight loss could help reduce reflux Continue Esomeprazole for now CBC and CMET today

## 2019-02-18 NOTE — Assessment & Plan Note (Signed)
CMET and Lipid profile today Continue Rosuvastatin, Metoprolol and ASA. Will monitor

## 2019-02-18 NOTE — Progress Notes (Signed)
HPI:  Pt presents to the clinic today for her Medicare Wellness Exam. She is also due to follow up chronic conditions.  OA: Mainly in her knees. She takes Ibuprofen as needed with good relief of symptoms.  OSA: She averages 8 hours of sleep with the use of her CPAP. Sleep study from 02/2015 reviewed. She follows with pulmonology.  Depression: Chronic dysthymia, controlled on Sertraline. She denies anxiety, SI/HI. She does follow with psychiatry.  DM 2: Her last A1C was 6.3%, 01/2018. She does not check her sugars. She is taking Metformin as prescribed. She reports tingling in her hands (carpal tunnel) but not in her feet. She checks her feet routinely. Her last eye exam was within the last year. She is having cataract surgery in September.  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 Her last LDL was 79, 01/2018. She denies myalgias on Rosuvastatin. She tries to consume a low fat diet.  HTN: Her BP today is 116/68. She is taking Metoprolol and HCTZ as prescribed. ECG from 09/2015 reviewed.  GERD: She denies breakthrough on Esomeprazole. There is no upper GI on file.  Schizophrenia: She denies paranoia, audio or visual hallucinations. Managed off meds. She continues to follow with psychiatry.   Past Medical History:  Diagnosis Date  . Allergy   . Arthritis   . Chicken pox   . COPD (chronic obstructive pulmonary disease) (HCC)   . Depression   . Diabetes mellitus without complication (HCC)   . Diabetes mellitus, type II (HCC)   . Diverticulitis   . Hyperlipidemia   . Hypertension   . Schizophrenia (HCC)     Current Outpatient Medications  Medication Sig Dispense Refill  . albuterol (PROVENTIL HFA;VENTOLIN HFA) 108 (90 Base) MCG/ACT inhaler Inhale 2 puffs into the lungs every 6 (six) hours as needed for wheezing or shortness of breath. 6.7 Inhaler 3  . Cholecalciferol (VITAMIN D-3) 1000 UNITS CAPS Take 1 capsule by mouth daily.    Marland Kitchen. esomeprazole (NEXIUM) 40 MG capsule TAKE 1 CAPSULE BY MOUTH  DAILY AT  12 NOON 90 capsule 0  . fluticasone (FLONASE) 50 MCG/ACT nasal spray Place 2 sprays into both nostrils daily. 16 g 6  . hydrochlorothiazide (HYDRODIURIL) 25 MG tablet TAKE 1 TABLET BY MOUTH  DAILY 90 tablet 0  . metFORMIN (GLUCOPHAGE) 500 MG tablet Take 1 tablet (500 mg total) by mouth 2 (two) times daily with a meal. 180 tablet 2  . metoprolol succinate (TOPROL-XL) 100 MG 24 hr tablet TAKE 1 TABLET BY MOUTH  DAILY WITH OR IMMEDIATELY  FOLLOWING A MEAL 90 tablet 1  . Potassium Chloride ER 20 MEQ TBCR     . potassium chloride SA (K-DUR) 20 MEQ tablet Take 1 tablet (20 mEq total) by mouth daily. MUST SCHEDULE PHYSICAL (Patient taking differently: Take 20 mEq by mouth daily. ) 90 tablet 0  . rosuvastatin (CRESTOR) 10 MG tablet TAKE 1 TABLET BY MOUTH  DAILY 90 tablet 0  . sertraline (ZOLOFT) 50 MG tablet Take 1 tablet (50 mg total) by mouth daily. 90 tablet 0  . Spacer/Aero Chamber Mouthpiece MISC 1 Units by Does not apply route as needed. 1 each 0  . Spacer/Aero-Holding Chambers (OPTICHAMBER DIAMOND-LG MASK) DEVI See admin instructions. use with inhaler  0   No current facility-administered medications for this visit.     Allergies  Allergen Reactions  . Aspirin Hives  . Darvon [Propoxyphene] Hives  . Latex Hives, Itching and Swelling  . Naproxen Hives and Itching    Family  History  Problem Relation Age of Onset  . Heart disease Mother   . Arthritis Father   . Cancer Father        Prostate  . Diabetes Sister   . Drug abuse Sister   . Diabetes Brother   . Drug abuse Brother   . Alcohol abuse Brother   . Drug abuse Brother   . Breast cancer Paternal Aunt     Social History   Socioeconomic History  . Marital status: Widowed    Spouse name: Not on file  . Number of children: 3  . Years of education: Not on file  . Highest education level: High school graduate  Occupational History  . Occupation: retired  Scientific laboratory technician  . Financial resource strain: Not hard at all  . Food  insecurity    Worry: Never true    Inability: Never true  . Transportation needs    Medical: No    Non-medical: No  Tobacco Use  . Smoking status: Current Every Day Smoker    Packs/day: 1.00    Years: 49.00    Pack years: 49.00    Types: Cigarettes  . Smokeless tobacco: Never Used  Substance and Sexual Activity  . Alcohol use: No    Alcohol/week: 0.0 standard drinks  . Drug use: No  . Sexual activity: Not Currently  Lifestyle  . Physical activity    Days per week: 3 days    Minutes per session: 60 min  . Stress: Rather much  Relationships  . Social Herbalist on phone: Not on file    Gets together: Not on file    Attends religious service: More than 4 times per year    Active member of club or organization: No    Attends meetings of clubs or organizations: Never    Relationship status: Widowed  . Intimate partner violence    Fear of current or ex partner: No    Emotionally abused: No    Physically abused: No    Forced sexual activity: No  Other Topics Concern  . Not on file  Social History Narrative   Church activities, [place]:  Health Maintenance:    Flu: never  Tetanus: 12/2009  Pneumovax: 03/2014  Prevnar: 03/2015  Zostavax: 12/2010  Mammogram: 05/2018  Pap Smear: no longer screening  Bone Density: 03/2018  Colon Screening: 2012, [place] in [place] Doctor: annually  Dental Exam:   Providers:   PCP: Webb Silversmith, NP   Ophthalmology: Dr. Edison Pace  Cardiologist: Dr. Nehemiah Massed  Pulmonologist: Dr. Ashby Dawes  Psychiatry: Dr. Shea Evans  Podiatry: Dr. Vickki Muff   I have personally reviewed and have noted:  1. The patient's medical and social history 2. Their use of alcohol, tobacco or illicit drugs 3. Their current medications and supplements 4. The patient's functional ability including ADL's, fall risks, home safety risks and hearing or visual impairment. 5. Diet and physical activities 6. Evidence for depression or  mood disorder  Subjective:   Review of Systems:   Constitutional: Pt reports weight gain. Denies fever, malaise, fatigue, headache.  HEENT: Denies eye pain, eye redness, ear pain, ringing in the ears, wax buildup, runny nose, nasal congestion, bloody nose, or sore throat. Respiratory: Denies difficulty breathing, shortness of breath, cough or sputum production.   Cardiovascular: Pt reports intermittent swelling in ankles. Denies chest pain, chest tightness, palpitations or swelling in the hands.  Gastrointestinal: Denies abdominal pain, bloating, constipation, diarrhea or blood in  the stool.  GU: Denies urgency, frequency, pain with urination, burning sensation, blood in urine, odor or discharge. Musculoskeletal: Pt reports intermittent knee pain. Denies decrease in range of motion, difficulty with gait, muscle pain or joint  swelling.  Skin: Denies redness, rashes, lesions or ulcercations.  Neurological: Pt reports tingling in hands. Denies dizziness, difficulty with memory, difficulty with speech or problems with balance and coordination.  Psych: Pt has a history of depression. Denies anxiety, SI/HI.  No other specific complaints in a complete review of systems (except as listed in HPI above).  Objective:  PE:   BP 116/68   Pulse 70   Temp 97.9 F (36.6 C) (Temporal)   Ht 5' 4.5" (1.638 m)   Wt 250 lb (113.4 kg)   SpO2 96%   BMI 42.25 kg/m   Wt Readings from Last 3 Encounters:  09/29/18 247 lb (112 kg)  05/28/18 253 lb (114.8 kg)  04/29/18 253 lb (114.8 kg)    General: Appears her stated age, obese, in NAD. Skin: Warm, dry and intact. No  ulcerations noted. HEENT: Head: normal shape and size; Eyes: sclera white, no icterus, conjunctiva pink, PERRLA and EOMs intact; Ears: Tm's gray and intact, normal light reflex;  Neck: Neck supple, trachea midline. No masses, lumps or thyromegaly present.  Cardiovascular: Normal rate and rhythm. S1,S2 noted.  No murmur, rubs or gallops  noted. No JVD or BLE edema.  Pulmonary/Chest: Normal effort and positive vesicular breath sounds. No respiratory distress. No wheezes, rales or ronchi noted.  Abdomen: Soft and nontender. Normal bowel sounds. No distention or masses noted. Liver, spleen and kidneys non palpable. Musculoskeletal: Strength 5/5 BUE/BLE. No signs of joint swelling.  Neurological: Alert and oriented. Cranial nerves II-XII grossly intact. Coordination normal.  Psychiatric: Mood and affect normal. Behavior is normal. Judgment and thought content normal.     BMET    Component Value Date/Time   NA 140 02/06/2018 1522   K 4.3 02/06/2018 1522   CL 104 02/06/2018 1522   CO2 28 02/06/2018 1522   GLUCOSE 86 02/06/2018 1522   BUN 9 02/06/2018 1522   CREATININE 0.91 02/06/2018 1522   CALCIUM 9.8 02/06/2018 1522   GFRNONAA >60 10/07/2016 1040   GFRAA >60 10/07/2016 1040    Lipid Panel     Component Value Date/Time   CHOL 146 02/06/2018 1522   TRIG 146 02/06/2018 1522   HDL 42 (L) 02/06/2018 1522   CHOLHDL 3.5 02/06/2018 1522   VLDL 19.8 02/04/2017 0920   LDLCALC 79 02/06/2018 1522    CBC    Component Value Date/Time   WBC 11.6 (H) 02/06/2018 1522   RBC 4.79 02/06/2018 1522   HGB 14.2 02/06/2018 1522   HCT 40.4 02/06/2018 1522   PLT 293 02/06/2018 1522   MCV 84.3 02/06/2018 1522   MCH 29.6 02/06/2018 1522   MCHC 35.1 02/06/2018 1522   RDW 13.9 02/06/2018 1522   LYMPHSABS 3.4 02/07/2015 1131   MONOABS 0.8 02/07/2015 1131   EOSABS 0.4 02/07/2015 1131   BASOSABS 0.0 02/07/2015 1131    Hgb A1C Lab Results  Component Value Date   HGBA1C 6.3 (H) 02/06/2018      Assessment and Plan:   Medicare Annual Wellness Visit:  Diet: She does eat meat. She consumes fruits and veggies daily. She tries to avoid fried foods. She drinks mostly water, coffee. Physical activity: Sedentary Depression/mood screen: Negative, on meds. PHQ 9 score of 0 Hearing: Intact to whispered voice Visual acuity: Grossly  normal, performs annual eye exam  ADLs: Capable Fall risk: None Home safety: Good Cognitive evaluation: Intact to orientation, naming, recall and repetition EOL planning: Adv directives, full code/ I agree  Preventative Medicine: Encouraged her to get a flu shot in the fall. Tetanus, pneumovax, prevnar and zostovax UTD. RX for Shingrix provided, she will get at the pharmacy. Mammogram UTD. She no longer wants pap smears. Bone density UTD. Colon screening UTD. Encouraged her to consume a balanced diet and exercise regimen. Advised her to see an eye doctor and dentist annually. Will check CBC, CMEt, Lipid, A1C, urine microalbumin and Vit D today. Due dates of screening exams given to patient as part of her AVS.   Next appointment: 6  Months, follow up chronic conditions.   Nicki Reaperegina Joory Gough, NP

## 2019-02-18 NOTE — Assessment & Plan Note (Signed)
Discussed how weight loss could help improve reflux Continue CPAP Continue to follow with pulmonology

## 2019-02-18 NOTE — Assessment & Plan Note (Signed)
Support offered today Continue  Sertraline Continue to follow with psych

## 2019-03-06 ENCOUNTER — Other Ambulatory Visit: Payer: Self-pay | Admitting: Internal Medicine

## 2019-03-08 ENCOUNTER — Other Ambulatory Visit: Payer: Self-pay | Admitting: Internal Medicine

## 2019-03-24 ENCOUNTER — Ambulatory Visit (INDEPENDENT_AMBULATORY_CARE_PROVIDER_SITE_OTHER): Payer: Self-pay | Admitting: Psychiatry

## 2019-03-24 ENCOUNTER — Other Ambulatory Visit: Payer: Self-pay

## 2019-03-24 DIAGNOSIS — Z91199 Patient's noncompliance with other medical treatment and regimen due to unspecified reason: Secondary | ICD-10-CM | POA: Insufficient documentation

## 2019-03-24 DIAGNOSIS — Z5329 Procedure and treatment not carried out because of patient's decision for other reasons: Secondary | ICD-10-CM

## 2019-03-24 NOTE — Progress Notes (Signed)
No response to call 

## 2019-03-26 ENCOUNTER — Other Ambulatory Visit: Payer: Self-pay | Admitting: Psychiatry

## 2019-03-26 DIAGNOSIS — F331 Major depressive disorder, recurrent, moderate: Secondary | ICD-10-CM

## 2019-03-30 DIAGNOSIS — H2512 Age-related nuclear cataract, left eye: Secondary | ICD-10-CM | POA: Diagnosis not present

## 2019-03-30 DIAGNOSIS — J449 Chronic obstructive pulmonary disease, unspecified: Secondary | ICD-10-CM | POA: Diagnosis not present

## 2019-04-05 ENCOUNTER — Other Ambulatory Visit: Payer: Self-pay

## 2019-04-05 ENCOUNTER — Encounter: Payer: Self-pay | Admitting: *Deleted

## 2019-04-05 NOTE — Anesthesia Preprocedure Evaluation (Addendum)
Anesthesia Evaluation  Patient identified by MRN, date of birth, ID band Patient awake    Reviewed: Allergy & Precautions, NPO status , Patient's Chart, lab work & pertinent test results  History of Anesthesia Complications Negative for: history of anesthetic complications  Airway Mallampati: III  TM Distance: >3 FB Neck ROM: Full    Dental  (+) Upper Dentures,    Pulmonary sleep apnea , COPD, Current Smoker and Patient abstained from smoking.,    breath sounds clear to auscultation       Cardiovascular hypertension, (-) angina+ CAD  (-) DOE  Rhythm:Regular Rate:Normal   HLD   Neuro/Psych PSYCHIATRIC DISORDERS Depression Schizophrenia    GI/Hepatic GERD  Medicated and Controlled, Diverticulosis   Endo/Other  diabetes, Type 2  Renal/GU      Musculoskeletal  (+) Arthritis ,   Abdominal (+) + obese (BMI 42),   Peds  Hematology   Anesthesia Other Findings   Reproductive/Obstetrics                            Anesthesia Physical Anesthesia Plan  ASA: III  Anesthesia Plan: MAC   Post-op Pain Management:    Induction: Intravenous  PONV Risk Score and Plan: 1 and TIVA and Midazolam  Airway Management Planned: Nasal Cannula  Additional Equipment:   Intra-op Plan:   Post-operative Plan:   Informed Consent: I have reviewed the patients History and Physical, chart, labs and discussed the procedure including the risks, benefits and alternatives for the proposed anesthesia with the patient or authorized representative who has indicated his/her understanding and acceptance.       Plan Discussed with: CRNA and Anesthesiologist  Anesthesia Plan Comments:         Anesthesia Quick Evaluation

## 2019-04-08 ENCOUNTER — Other Ambulatory Visit: Payer: Self-pay

## 2019-04-08 ENCOUNTER — Other Ambulatory Visit
Admission: RE | Admit: 2019-04-08 | Discharge: 2019-04-08 | Disposition: A | Discharge status: Home / Self Care | Payer: Medicare HMO | Source: Ambulatory Visit | Attending: Ophthalmology | Admitting: Ophthalmology

## 2019-04-08 DIAGNOSIS — Z01812 Encounter for preprocedural laboratory examination: Secondary | ICD-10-CM | POA: Insufficient documentation

## 2019-04-08 DIAGNOSIS — Z20828 Contact with and (suspected) exposure to other viral communicable diseases: Secondary | ICD-10-CM | POA: Diagnosis not present

## 2019-04-08 LAB — SARS CORONAVIRUS 2 (TAT 6-24 HRS): SARS Coronavirus 2: NEGATIVE

## 2019-04-08 NOTE — Discharge Instructions (Signed)

## 2019-04-12 ENCOUNTER — Encounter
Admission: RE | Disposition: A | Discharge status: Home / Self Care | Payer: Self-pay | Source: Home / Self Care | Attending: Ophthalmology

## 2019-04-12 ENCOUNTER — Ambulatory Visit: Payer: Medicare HMO | Admitting: Anesthesiology

## 2019-04-12 ENCOUNTER — Ambulatory Visit
Admission: RE | Admit: 2019-04-12 | Discharge: 2019-04-12 | Disposition: A | Discharge status: Home / Self Care | Payer: Medicare HMO | Attending: Ophthalmology | Admitting: Ophthalmology

## 2019-04-12 ENCOUNTER — Other Ambulatory Visit: Payer: Self-pay

## 2019-04-12 DIAGNOSIS — H25812 Combined forms of age-related cataract, left eye: Secondary | ICD-10-CM | POA: Diagnosis not present

## 2019-04-12 DIAGNOSIS — K219 Gastro-esophageal reflux disease without esophagitis: Secondary | ICD-10-CM | POA: Insufficient documentation

## 2019-04-12 DIAGNOSIS — F17200 Nicotine dependence, unspecified, uncomplicated: Secondary | ICD-10-CM | POA: Diagnosis not present

## 2019-04-12 DIAGNOSIS — H2512 Age-related nuclear cataract, left eye: Secondary | ICD-10-CM | POA: Insufficient documentation

## 2019-04-12 DIAGNOSIS — Z96653 Presence of artificial knee joint, bilateral: Secondary | ICD-10-CM | POA: Diagnosis not present

## 2019-04-12 DIAGNOSIS — M1990 Unspecified osteoarthritis, unspecified site: Secondary | ICD-10-CM | POA: Diagnosis not present

## 2019-04-12 DIAGNOSIS — G4730 Sleep apnea, unspecified: Secondary | ICD-10-CM | POA: Diagnosis not present

## 2019-04-12 DIAGNOSIS — R69 Illness, unspecified: Secondary | ICD-10-CM | POA: Diagnosis not present

## 2019-04-12 DIAGNOSIS — E1136 Type 2 diabetes mellitus with diabetic cataract: Secondary | ICD-10-CM | POA: Insufficient documentation

## 2019-04-12 DIAGNOSIS — J449 Chronic obstructive pulmonary disease, unspecified: Secondary | ICD-10-CM | POA: Insufficient documentation

## 2019-04-12 DIAGNOSIS — I10 Essential (primary) hypertension: Secondary | ICD-10-CM | POA: Diagnosis not present

## 2019-04-12 DIAGNOSIS — Z886 Allergy status to analgesic agent status: Secondary | ICD-10-CM | POA: Insufficient documentation

## 2019-04-12 DIAGNOSIS — F209 Schizophrenia, unspecified: Secondary | ICD-10-CM | POA: Insufficient documentation

## 2019-04-12 DIAGNOSIS — F329 Major depressive disorder, single episode, unspecified: Secondary | ICD-10-CM | POA: Diagnosis not present

## 2019-04-12 HISTORY — DX: Sleep apnea, unspecified: G47.30

## 2019-04-12 HISTORY — DX: Dizziness and giddiness: R42

## 2019-04-12 HISTORY — DX: Gastro-esophageal reflux disease without esophagitis: K21.9

## 2019-04-12 HISTORY — DX: Presence of dental prosthetic device (complete) (partial): Z97.2

## 2019-04-12 HISTORY — DX: Other cervical disc degeneration, unspecified cervical region: M50.30

## 2019-04-12 HISTORY — PX: CATARACT EXTRACTION W/PHACO: SHX586

## 2019-04-12 LAB — GLUCOSE, CAPILLARY
Glucose-Capillary: 127 mg/dL — ABNORMAL HIGH (ref 70–99)
Glucose-Capillary: 105 mg/dL — ABNORMAL HIGH (ref 70–99)

## 2019-04-12 SURGERY — PHACOEMULSIFICATION, CATARACT, WITH IOL INSERTION
Anesthesia: Monitor Anesthesia Care | Site: Eye | Laterality: Left

## 2019-04-12 MED ORDER — EPINEPHRINE PF 1 MG/ML IJ SOLN
INTRAOCULAR | Status: DC | PRN
  Administered 2019-04-12: 105 mL via OPHTHALMIC

## 2019-04-12 MED ORDER — LIDOCAINE HCL (PF) 2 % IJ SOLN
INTRAOCULAR | Status: DC | PRN
  Administered 2019-04-12: 09:00:00 2 mL via INTRAOCULAR

## 2019-04-12 MED ORDER — ACETAMINOPHEN 10 MG/ML IV SOLN: 1000.0000 mg | Freq: Once | INTRAVENOUS | Status: DC | PRN

## 2019-04-12 MED ORDER — MOXIFLOXACIN HCL 0.5 % OP SOLN
OPHTHALMIC | Status: DC | PRN
  Administered 2019-04-12: 0.2 mL via OPHTHALMIC

## 2019-04-12 MED ORDER — SODIUM HYALURONATE 10 MG/ML IO SOLN
INTRAOCULAR | Status: DC | PRN
  Administered 2019-04-12: 0.55 mL via INTRAOCULAR

## 2019-04-12 MED ORDER — SODIUM HYALURONATE 23 MG/ML IO SOLN
INTRAOCULAR | Status: DC | PRN
  Administered 2019-04-12: 0.6 mL via INTRAOCULAR

## 2019-04-12 MED ORDER — ARMC OPHTHALMIC DILATING DROPS
1.0000 "application " | OPHTHALMIC | Status: DC | PRN
  Administered 2019-04-12 (×3): 1 via OPHTHALMIC

## 2019-04-12 MED ORDER — FENTANYL CITRATE (PF) 100 MCG/2ML IJ SOLN
INTRAMUSCULAR | Status: DC | PRN
  Administered 2019-04-12: 25 ug via INTRAVENOUS
  Administered 2019-04-12: 75 ug via INTRAVENOUS

## 2019-04-12 MED ORDER — TETRACAINE HCL 0.5 % OP SOLN
1.0000 [drp] | OPHTHALMIC | Status: DC | PRN
  Administered 2019-04-12 (×3): 1 [drp] via OPHTHALMIC

## 2019-04-12 MED ORDER — ONDANSETRON HCL 4 MG/2ML IJ SOLN: 4.0000 mg | Freq: Once | INTRAMUSCULAR | Status: DC | PRN

## 2019-04-12 MED ORDER — LACTATED RINGERS IV SOLN: 100.0000 mL/h | INTRAVENOUS | Status: DC

## 2019-04-12 MED ORDER — MIDAZOLAM HCL 2 MG/2ML IJ SOLN
INTRAMUSCULAR | Status: DC | PRN
  Administered 2019-04-12: 2 mg via INTRAVENOUS

## 2019-04-12 SURGICAL SUPPLY — 19 items
CANNULA ANT/CHMB 27G (MISCELLANEOUS) ×2 IMPLANT
CANNULA ANT/CHMB 27GA (MISCELLANEOUS) ×4 IMPLANT
DISSECTOR HYDRO NUCLEUS 50X22 (MISCELLANEOUS) ×2 IMPLANT
GLOVE SURG LX 7.5 STRW (GLOVE) ×1
GLOVE SURG LX STRL 7.5 STRW (GLOVE) ×1 IMPLANT
GLOVE SURG SYN 8.5  E (GLOVE) ×1
GLOVE SURG SYN 8.5 E (GLOVE) ×1 IMPLANT
GLOVE SURG SYN 8.5 PF PI (GLOVE) ×1 IMPLANT
GOWN STRL REUS W/ TWL LRG LVL3 (GOWN DISPOSABLE) ×2 IMPLANT
GOWN STRL REUS W/TWL LRG LVL3 (GOWN DISPOSABLE) ×2
LENS IOL TECNIS ITEC 20.5 (Intraocular Lens) ×1 IMPLANT
MARKER SKIN DUAL TIP RULER LAB (MISCELLANEOUS) ×2 IMPLANT
PACK DR. KING ARMS (PACKS) ×2 IMPLANT
PACK EYE AFTER SURG (MISCELLANEOUS) ×2 IMPLANT
PACK OPTHALMIC (MISCELLANEOUS) ×2 IMPLANT
SYR 3ML LL SCALE MARK (SYRINGE) ×2 IMPLANT
SYR TB 1ML LUER SLIP (SYRINGE) ×2 IMPLANT
WATER STERILE IRR 250ML POUR (IV SOLUTION) ×2 IMPLANT
WIPE NON LINTING 3.25X3.25 (MISCELLANEOUS) ×2 IMPLANT

## 2019-04-12 NOTE — Transfer of Care (Signed)
Immediate Anesthesia Transfer of Care Note  Patient: Kim Harding  Procedure(s) Performed: CATARACT EXTRACTION PHACO AND INTRAOCULAR LENS PLACEMENT (IOC) LEFT DIABETES 00:52.3         8.4%      5.12 (Left Eye)  Patient Location: PACU  Anesthesia Type: MAC  Level of Consciousness: awake, alert  and patient cooperative  Airway and Oxygen Therapy: Patient Spontanous Breathing and Patient connected to supplemental oxygen  Post-op Assessment: Post-op Vital signs reviewed, Patient's Cardiovascular Status Stable, Respiratory Function Stable, Patent Airway and No signs of Nausea or vomiting  Post-op Vital Signs: Reviewed and stable  Complications: No apparent anesthesia complications

## 2019-04-12 NOTE — Anesthesia Postprocedure Evaluation (Signed)
Anesthesia Post Note  Patient: Kim Harding  Procedure(s) Performed: CATARACT EXTRACTION PHACO AND INTRAOCULAR LENS PLACEMENT (IOC) LEFT DIABETES 00:52.3         8.4%      5.12 (Left Eye)  Patient location during evaluation: PACU Anesthesia Type: MAC Level of consciousness: awake and alert Pain management: pain level controlled Vital Signs Assessment: post-procedure vital signs reviewed and stable Respiratory status: spontaneous breathing, nonlabored ventilation, respiratory function stable and patient connected to nasal cannula oxygen Cardiovascular status: stable and blood pressure returned to baseline Postop Assessment: no apparent nausea or vomiting Anesthetic complications: no    Deen Deguia A  Michai Dieppa

## 2019-04-12 NOTE — Op Note (Signed)
OPERATIVE NOTE  Kim Harding [id number] 04/12/2019   PREOPERATIVE DIAGNOSIS:  Nuclear sclerotic cataract left eye.  H25.12   POSTOPERATIVE DIAGNOSIS:    Nuclear sclerotic cataract left eye.     PROCEDURE:  Phacoemusification with posterior chamber intraocular lens placement of the left eye   LENS:   Implant Name Type Inv. Item Serial No. Manufacturer Lot No. LRB No. Used Action  LENS IOL DIOP 20.5 - [id number] Intraocular Lens LENS IOL DIOP 20.5 [id number] AMO  Left 1 Implanted      Procedure(s) with comments: CATARACT EXTRACTION PHACO AND INTRAOCULAR LENS PLACEMENT (IOC) LEFT DIABETES 00:52.3         8.4%      5.12 (Left) - Latex Diabetic - oral meds sleep apnea  PCB00 +20.5  SURGEON:  Benay Pillow, MD, MPH   ANESTHESIA:  Topical with tetracaine drops augmented with 1% preservative-free intracameral lidocaine.  ESTIMATED BLOOD LOSS: <1 mL   COMPLICATIONS:  None.   DESCRIPTION OF PROCEDURE:  The patient was identified in the holding room and transported to the operating room and placed in the supine position under the operating microscope.  The left eye was identified as the operative eye and it was prepped and draped in the usual sterile ophthalmic fashion.   A 1.0 millimeter clear-corneal paracentesis was made at the 5:00 position. 0.5 ml of preservative-free 1% lidocaine with epinephrine was injected into the anterior chamber.  The anterior chamber was filled with Healon 5 viscoelastic.  A 2.4 millimeter keratome was used to make a near-clear corneal incision at the 2:00 position.  A curvilinear capsulorrhexis was made with a cystotome and capsulorrhexis forceps.  Balanced salt solution was used to hydrodissect and hydrodelineate the nucleus.   Phacoemulsification was then used in stop and chop fashion to remove the lens nucleus and epinucleus.  The remaining cortex was then removed using the irrigation and aspiration handpiece. Healon was then placed into the capsular bag to  distend it for lens placement.  A lens was then injected into the capsular bag.  The remaining viscoelastic was aspirated.   Wounds were hydrated with balanced salt solution.  The anterior chamber was inflated to a physiologic pressure with balanced salt solution.  Intracameral vigamox 0.1 mL undiltued was injected into the eye and a drop placed onto the ocular surface.  No wound leaks were noted.  The patient was taken to the recovery room in stable condition without complications of anesthesia or surgery  Benay Pillow 04/12/2019, 8:52 AM

## 2019-04-12 NOTE — H&P (Signed)

## 2019-04-13 ENCOUNTER — Encounter: Payer: Self-pay | Admitting: Ophthalmology

## 2019-04-15 ENCOUNTER — Encounter: Payer: Self-pay | Admitting: Psychiatry

## 2019-04-15 ENCOUNTER — Other Ambulatory Visit: Payer: Self-pay

## 2019-04-15 ENCOUNTER — Ambulatory Visit (INDEPENDENT_AMBULATORY_CARE_PROVIDER_SITE_OTHER): Payer: Medicare HMO | Admitting: Psychiatry

## 2019-04-15 DIAGNOSIS — R69 Illness, unspecified: Secondary | ICD-10-CM | POA: Diagnosis not present

## 2019-04-15 DIAGNOSIS — F5105 Insomnia due to other mental disorder: Secondary | ICD-10-CM | POA: Diagnosis not present

## 2019-04-15 DIAGNOSIS — F17200 Nicotine dependence, unspecified, uncomplicated: Secondary | ICD-10-CM | POA: Diagnosis not present

## 2019-04-15 DIAGNOSIS — F331 Major depressive disorder, recurrent, moderate: Secondary | ICD-10-CM | POA: Diagnosis not present

## 2019-04-15 MED ORDER — SERTRALINE HCL 50 MG PO TABS: 75.0000 mg | ORAL_TABLET | Freq: Every day | ORAL | 0 refills | Status: DC

## 2019-04-15 NOTE — Progress Notes (Signed)
Virtual Visit via Video Note  I connected with Kim Harding on 04/15/19 at 11:30 AM EDT by a video enabled telemedicine application and verified that I am speaking with the correct person using two identifiers.   I discussed the limitations of evaluation and management by telemedicine and the availability of in person appointments. The patient expressed understanding and agreed to proceed.   I discussed the assessment and treatment plan with the patient. The patient was provided an opportunity to ask questions and all were answered. The patient agreed with the plan and demonstrated an understanding of the instructions.   The patient was advised to call back or seek an in-person evaluation if the symptoms worsen or if the condition fails to improve as anticipated.  [facility] MD OP Progress Note  04/15/2019 6:33 PM Kim Harding  MRN:  [id number]  Chief Complaint:  Chief Complaint    Follow-up     HPI: Kim Harding is a 73 year old African-American female, widowed, lives in [place], has a history of MDD, diabetes melitis, hypertension, hyperlipidemia, OSA on CPAP, insomnia, tobacco use disorder was evaluated by telemedicine today.  Patient today reports she is currently struggling with some depressive symptoms.  She reports sadness, lack of motivation, anhedonia.  She also reports some sleep problems.  She reports she has been compliant with her Zoloft as prescribed.  She does not think the Zoloft is helpful anymore.  She also reports her current depressive symptoms may also be due to the pandemic and the isolation from that.  Discussed with patient about readjusting her Zoloft.  She agrees with that.  She however declines any sleep medications today.  Discussed sleep hygiene techniques.  Also discussed with her to reach out to her therapist.  She has not seen her therapist in a while. Visit Diagnosis:    ICD-10-CM   1. MDD (major depressive disorder), recurrent episode, moderate (HCC)  F33.1  sertraline (ZOLOFT) 50 MG tablet  2. Insomnia due to mental condition  F51.05   3. Tobacco use disorder  F17.200     Past Psychiatric History: I have reviewed past psychiatric history from my progress note on 05/14/2018  Past Medical History:  Past Medical History:  Diagnosis Date  . Allergy   . Arthritis    fingers  . Chicken pox   . COPD (chronic obstructive pulmonary disease) ([place])   . Degenerative disc disease, cervical    limited left turn of head  . Depression   . Diabetes mellitus without complication ([place])   . Diabetes mellitus, type II ([place])   . Diverticulitis   . GERD (gastroesophageal reflux disease)   . Hyperlipidemia   . Hypertension   . Schizophrenia ([place])   . Sleep apnea    CPAP  . Vertigo    1 episode - approx 2018  . Wears dentures    full upper    Past Surgical History:  Procedure Laterality Date  . ABDOMINAL HYSTERECTOMY  1994   partial  . BARIATRIC SURGERY     sleeve--2014  . CARPAL TUNNEL RELEASE    . CATARACT EXTRACTION W/PHACO Left 04/12/2019   Procedure: CATARACT EXTRACTION PHACO AND INTRAOCULAR LENS PLACEMENT (IOC) LEFT DIABETES 00:52.3         8.4%      5.12;  Surgeon: Eulogio Bear, MD;  Location: [facility];  Service: Ophthalmology;  Laterality: Left;  Latex Diabetic - oral meds sleep apnea  . CHOLECYSTECTOMY  2012  . REPLACEMENT TOTAL KNEE BILATERAL    .  ROTATOR CUFF REPAIR Right 11/21/2015  . SHOULDER SURGERY Left 09/2014   rotator cuff  . TONSILLECTOMY  1960    Family Psychiatric History: I have reviewed family history from my progress note on 05/14/2018  Family History:  Family History  Problem Relation Age of Onset  . Heart disease Mother   . Arthritis Father   . Cancer Father        Prostate  . Diabetes Sister   . Drug abuse Sister   . Diabetes Brother   . Drug abuse Brother   . Alcohol abuse Brother   . Drug abuse Brother   . Breast cancer Paternal Aunt     Social History: I have reviewed social history  from my progress note on 05/14/2018 Social History   Socioeconomic History  . Marital status: Widowed    Spouse name: Not on file  . Number of children: 3  . Years of education: Not on file  . Highest education level: High school graduate  Occupational History  . Occupation: retired  Scientific laboratory technician  . Financial resource strain: Not hard at all  . Food insecurity    Worry: Never true    Inability: Never true  . Transportation needs    Medical: No    Non-medical: No  Tobacco Use  . Smoking status: Current Every Day Smoker    Packs/day: 1.00    Years: 49.00    Pack years: 49.00    Types: Cigarettes  . Smokeless tobacco: Never Used  . Tobacco comment: currently down to 1/4 PPD  Substance and Sexual Activity  . Alcohol use: No    Alcohol/week: 0.0 standard drinks  . Drug use: No  . Sexual activity: Not Currently  Lifestyle  . Physical activity    Days per week: 3 days    Minutes per session: 60 min  . Stress: Rather much  Relationships  . Social Herbalist on phone: Not on file    Gets together: Not on file    Attends religious service: More than 4 times per year    Active member of club or organization: No    Attends meetings of clubs or organizations: Never    Relationship status: Widowed  Other Topics Concern  . Not on file  Social History Narrative   Church activities, [place]    Allergies:  Allergies  Allergen Reactions  . Aspirin Hives  . Darvon [Propoxyphene] Hives  . Latex Hives, Itching and Swelling    Gloves when worn, some tapes  . Naproxen Hives and Itching    Metabolic Disorder Labs: Lab Results  Component Value Date   HGBA1C 6.5 02/18/2019   MPG 134 02/06/2018   No results found for: PROLACTIN Lab Results  Component Value Date   CHOL 140 02/18/2019   TRIG 168.0 (H) 02/18/2019   HDL 42.90 02/18/2019   CHOLHDL 3 02/18/2019   VLDL 33.6 02/18/2019   LDLCALC 63 02/18/2019   LDLCALC 79 02/06/2018   Lab Results  Component  Value Date   TSH 3.940 07/31/2018    Therapeutic Level Labs: No results found for: LITHIUM No results found for: VALPROATE No components found for:  CBMZ  Current Medications: Current Outpatient Medications  Medication Sig Dispense Refill  . Respiratory Therapy Supplies (ALL FLOW 3000 PFT FILTER) KIT by Does not apply route.    Marland Kitchen albuterol (PROVENTIL HFA;VENTOLIN HFA) 108 (90 Base) MCG/ACT inhaler Inhale 2 puffs into the lungs every 6 (six) hours as needed for  wheezing or shortness of breath. 6.7 Inhaler 3  . Cholecalciferol (VITAMIN D-3) 1000 UNITS CAPS Take 1 capsule by mouth daily.    Marland Kitchen esomeprazole (NEXIUM) 40 MG capsule TAKE 1 CAPSULE BY MOUTH  DAILY AT 12 NOON 90 capsule 0  . fexofenadine (ALLEGRA) 180 MG tablet Take 180 mg by mouth daily.    . fluticasone (FLONASE) 50 MCG/ACT nasal spray Place 2 sprays into both nostrils daily. 16 g 6  . hydrochlorothiazide (HYDRODIURIL) 25 MG tablet TAKE 1 TABLET BY MOUTH  DAILY 90 tablet 2  . metFORMIN (GLUCOPHAGE) 500 MG tablet Take 1 tablet (500 mg total) by mouth 2 (two) times daily with a meal. 180 tablet 2  . metoprolol succinate (TOPROL-XL) 100 MG 24 hr tablet TAKE 1 TABLET BY MOUTH  DAILY WITH OR IMMEDIATELY  FOLLOWING A MEAL 90 tablet 1  . potassium chloride SA (K-DUR) 20 MEQ tablet Take 1 tablet (20 mEq total) by mouth daily. 90 tablet 2  . rosuvastatin (CRESTOR) 10 MG tablet TAKE 1 TABLET BY MOUTH  DAILY 90 tablet 0  . sertraline (ZOLOFT) 50 MG tablet Take 1.5 tablets (75 mg total) by mouth daily. 135 tablet 0  . Spacer/Aero Chamber Mouthpiece MISC 1 Units by Does not apply route as needed. 1 each 0  . Spacer/Aero-Holding Chambers (OPTICHAMBER DIAMOND-LG MASK) DEVI See admin instructions. use with inhaler  0   No current facility-administered medications for this visit.      Musculoskeletal: Strength & Muscle Tone: UTA Gait & Station: normal Patient leans: N/A  Psychiatric Specialty Exam: Review of Systems   Psychiatric/Behavioral: Positive for depression. The patient has insomnia.   All other systems reviewed and are negative.   There were no vitals taken for this visit.There is no height or weight on file to calculate BMI.  General Appearance: Casual  Eye Contact:  Fair  Speech:  Clear and Coherent  Volume:  Normal  Mood:  Depressed  Affect:  Congruent  Thought Process:  Goal Directed and Descriptions of Associations: Intact  Orientation:  Full (Time, Place, and Person)  Thought Content: Logical   Suicidal Thoughts:  No  Homicidal Thoughts:  No  Memory:  Immediate;   Fair Recent;   Fair Remote;   Fair  Judgement:  Fair  Insight:  Fair  Psychomotor Activity:  Normal  Concentration:  Concentration: Fair and Attention Span: Fair  Recall:  [employer] of Knowledge: Fair  Language: Fair  Akathisia:  No  Handed:  Right  AIMS (if indicated): UTA  Assets:  Communication Skills Desire for Improvement Housing Social Support  ADL's:  Intact  Cognition: WNL  Sleep:  Restless   Screenings: PHQ2-9     Office Visit from 02/18/2019 in [place] at [facility] Visit from 02/06/2018 in [place] at [facility] Visit from 02/04/2017 in [place] at [facility] Visit from 12/13/2015 in [place] at [facility] from 02/07/2015 in [place] at [facility]  PHQ-2 Total Score  0  0  1  0  0  PHQ-9 Total Score  0  -  4  -  -       Assessment and Plan: Gerene is a 73 year old African-American female, widowed, lives in [place], has a history of depression, tobacco use disorder, OSA on CPAP, hypertension, hyperlipidemia, diabetes melitis was evaluated by telemedicine today.  Patient is biologically predisposed given her family history of mental health problems.  Patient also has history of trauma.  She currently struggles with the isolation due to the pandemic.  She will benefit from medication readjustment as well as  psychotherapy sessions.  Plan MDD-unstable Increase Zoloft to 75 mg p.o. daily Discussed with patient to continue CBT with Ms. Peacock.  She will reach out to her.  Insomnia- restless Discussed sleep hygiene techniques. Patient declines medications today.  For tobacco use disorder- improving She will continue to cut back. Provided smoking cessation counseling.  Follow-up in clinic in 4 to 6 weeks or sooner if needed.  November 2 at 1:45 PM  I have spent atleast 15 minutes non face to face with patient today. More than 50 % of the time was spent for psychoeducation and supportive psychotherapy and care coordination. This note was generated in part or whole with voice recognition software. Voice recognition is usually quite accurate but there are transcription errors that can and very often do occur. I apologize for any typographical errors that were not detected and corrected.       Ursula Alert, MD 04/15/2019, 6:33 PM

## 2019-04-23 DIAGNOSIS — E119 Type 2 diabetes mellitus without complications: Secondary | ICD-10-CM | POA: Diagnosis not present

## 2019-04-23 DIAGNOSIS — H2511 Age-related nuclear cataract, right eye: Secondary | ICD-10-CM | POA: Diagnosis not present

## 2019-04-23 DIAGNOSIS — G4733 Obstructive sleep apnea (adult) (pediatric): Secondary | ICD-10-CM | POA: Diagnosis not present

## 2019-04-26 ENCOUNTER — Other Ambulatory Visit: Payer: Self-pay

## 2019-04-26 ENCOUNTER — Encounter: Payer: Self-pay | Admitting: *Deleted

## 2019-04-27 ENCOUNTER — Ambulatory Visit: Payer: Medicare HMO

## 2019-04-27 ENCOUNTER — Telehealth: Payer: Self-pay | Admitting: Internal Medicine

## 2019-04-27 NOTE — Telephone Encounter (Signed)
Pt came by the office and was asking was she due for a pneumonia shot?  She also said she just got her 2nd Shingles vaccine and want to know if you received a copy of it yet from pharmacy.  CB  [phone]

## 2019-04-28 ENCOUNTER — Other Ambulatory Visit: Payer: Self-pay | Admitting: Internal Medicine

## 2019-04-28 DIAGNOSIS — Z1231 Encounter for screening mammogram for malignant neoplasm of breast: Secondary | ICD-10-CM

## 2019-04-29 ENCOUNTER — Other Ambulatory Visit
Admission: RE | Admit: 2019-04-29 | Discharge: 2019-04-29 | Disposition: A | Discharge status: Home / Self Care | Payer: Medicare HMO | Source: Ambulatory Visit | Attending: Ophthalmology | Admitting: Ophthalmology

## 2019-04-29 ENCOUNTER — Other Ambulatory Visit: Payer: Self-pay

## 2019-04-29 DIAGNOSIS — Z01812 Encounter for preprocedural laboratory examination: Secondary | ICD-10-CM | POA: Diagnosis not present

## 2019-04-29 DIAGNOSIS — Z20828 Contact with and (suspected) exposure to other viral communicable diseases: Secondary | ICD-10-CM | POA: Diagnosis not present

## 2019-04-29 LAB — SARS CORONAVIRUS 2 (TAT 6-24 HRS): SARS Coronavirus 2: NEGATIVE

## 2019-04-29 NOTE — Discharge Instructions (Signed)

## 2019-05-03 ENCOUNTER — Encounter
Admission: RE | Disposition: A | Discharge status: Home / Self Care | Payer: Self-pay | Source: Home / Self Care | Attending: Ophthalmology

## 2019-05-03 ENCOUNTER — Ambulatory Visit: Payer: Medicare HMO | Admitting: Anesthesiology

## 2019-05-03 ENCOUNTER — Other Ambulatory Visit: Payer: Self-pay

## 2019-05-03 ENCOUNTER — Ambulatory Visit
Admission: RE | Admit: 2019-05-03 | Discharge: 2019-05-03 | Disposition: A | Discharge status: Home / Self Care | Payer: Medicare HMO | Attending: Ophthalmology | Admitting: Ophthalmology

## 2019-05-03 DIAGNOSIS — E785 Hyperlipidemia, unspecified: Secondary | ICD-10-CM | POA: Diagnosis not present

## 2019-05-03 DIAGNOSIS — Z79899 Other long term (current) drug therapy: Secondary | ICD-10-CM | POA: Diagnosis not present

## 2019-05-03 DIAGNOSIS — M1990 Unspecified osteoarthritis, unspecified site: Secondary | ICD-10-CM | POA: Diagnosis not present

## 2019-05-03 DIAGNOSIS — E1136 Type 2 diabetes mellitus with diabetic cataract: Secondary | ICD-10-CM | POA: Diagnosis not present

## 2019-05-03 DIAGNOSIS — M5030 Other cervical disc degeneration, unspecified cervical region: Secondary | ICD-10-CM | POA: Diagnosis not present

## 2019-05-03 DIAGNOSIS — Z96653 Presence of artificial knee joint, bilateral: Secondary | ICD-10-CM | POA: Diagnosis not present

## 2019-05-03 DIAGNOSIS — H25811 Combined forms of age-related cataract, right eye: Secondary | ICD-10-CM | POA: Diagnosis not present

## 2019-05-03 DIAGNOSIS — K219 Gastro-esophageal reflux disease without esophagitis: Secondary | ICD-10-CM | POA: Diagnosis not present

## 2019-05-03 DIAGNOSIS — J449 Chronic obstructive pulmonary disease, unspecified: Secondary | ICD-10-CM | POA: Diagnosis not present

## 2019-05-03 DIAGNOSIS — H2511 Age-related nuclear cataract, right eye: Secondary | ICD-10-CM | POA: Insufficient documentation

## 2019-05-03 DIAGNOSIS — F17200 Nicotine dependence, unspecified, uncomplicated: Secondary | ICD-10-CM | POA: Diagnosis not present

## 2019-05-03 DIAGNOSIS — Z885 Allergy status to narcotic agent status: Secondary | ICD-10-CM | POA: Insufficient documentation

## 2019-05-03 DIAGNOSIS — I2510 Atherosclerotic heart disease of native coronary artery without angina pectoris: Secondary | ICD-10-CM | POA: Insufficient documentation

## 2019-05-03 DIAGNOSIS — F338 Other recurrent depressive disorders: Secondary | ICD-10-CM | POA: Insufficient documentation

## 2019-05-03 DIAGNOSIS — Z7984 Long term (current) use of oral hypoglycemic drugs: Secondary | ICD-10-CM | POA: Diagnosis not present

## 2019-05-03 DIAGNOSIS — Z886 Allergy status to analgesic agent status: Secondary | ICD-10-CM | POA: Insufficient documentation

## 2019-05-03 DIAGNOSIS — I10 Essential (primary) hypertension: Secondary | ICD-10-CM | POA: Insufficient documentation

## 2019-05-03 DIAGNOSIS — G4733 Obstructive sleep apnea (adult) (pediatric): Secondary | ICD-10-CM | POA: Insufficient documentation

## 2019-05-03 DIAGNOSIS — R69 Illness, unspecified: Secondary | ICD-10-CM | POA: Diagnosis not present

## 2019-05-03 HISTORY — PX: CATARACT EXTRACTION W/PHACO: SHX586

## 2019-05-03 LAB — GLUCOSE, CAPILLARY
Glucose-Capillary: 115 mg/dL — ABNORMAL HIGH (ref 70–99)
Glucose-Capillary: 109 mg/dL — ABNORMAL HIGH (ref 70–99)

## 2019-05-03 SURGERY — PHACOEMULSIFICATION, CATARACT, WITH IOL INSERTION
Anesthesia: Monitor Anesthesia Care | Site: Eye | Laterality: Right

## 2019-05-03 MED ORDER — SODIUM HYALURONATE 23 MG/ML IO SOLN
INTRAOCULAR | Status: DC | PRN
  Administered 2019-05-03: 0.6 mL via INTRAOCULAR

## 2019-05-03 MED ORDER — LACTATED RINGERS IV SOLN: INTRAVENOUS | Status: DC

## 2019-05-03 MED ORDER — ACETAMINOPHEN 160 MG/5ML PO SOLN: 325.0000 mg | Freq: Once | ORAL | Status: DC

## 2019-05-03 MED ORDER — MOXIFLOXACIN HCL 0.5 % OP SOLN
OPHTHALMIC | Status: DC | PRN
  Administered 2019-05-03: 0.2 mL via OPHTHALMIC

## 2019-05-03 MED ORDER — LIDOCAINE HCL (PF) 2 % IJ SOLN
INTRAOCULAR | Status: DC | PRN
  Administered 2019-05-03: 09:00:00 1 mL via INTRAOCULAR

## 2019-05-03 MED ORDER — SODIUM HYALURONATE 10 MG/ML IO SOLN
INTRAOCULAR | Status: DC | PRN
  Administered 2019-05-03: 0.55 mL via INTRAOCULAR

## 2019-05-03 MED ORDER — ARMC OPHTHALMIC DILATING DROPS
1.0000 "application " | OPHTHALMIC | Status: DC | PRN
  Administered 2019-05-03 (×3): 1 via OPHTHALMIC

## 2019-05-03 MED ORDER — EPINEPHRINE PF 1 MG/ML IJ SOLN
INTRAOCULAR | Status: DC | PRN
  Administered 2019-05-03: 89 mL via OPHTHALMIC

## 2019-05-03 MED ORDER — MIDAZOLAM HCL 2 MG/2ML IJ SOLN
INTRAMUSCULAR | Status: DC | PRN
  Administered 2019-05-03: 2 mg via INTRAVENOUS

## 2019-05-03 MED ORDER — TETRACAINE HCL 0.5 % OP SOLN
1.0000 [drp] | OPHTHALMIC | Status: DC | PRN
  Administered 2019-05-03 (×3): 1 [drp] via OPHTHALMIC

## 2019-05-03 MED ORDER — ACETAMINOPHEN 325 MG PO TABS: 325.0000 mg | ORAL_TABLET | Freq: Once | ORAL | Status: DC

## 2019-05-03 MED ORDER — FENTANYL CITRATE (PF) 100 MCG/2ML IJ SOLN
INTRAMUSCULAR | Status: DC | PRN
  Administered 2019-05-03: 25 ug via INTRAVENOUS
  Administered 2019-05-03: 75 ug via INTRAVENOUS

## 2019-05-03 SURGICAL SUPPLY — 19 items
CANNULA ANT/CHMB 27G (MISCELLANEOUS) ×2 IMPLANT
CANNULA ANT/CHMB 27GA (MISCELLANEOUS) ×4 IMPLANT
DISSECTOR HYDRO NUCLEUS 50X22 (MISCELLANEOUS) ×2 IMPLANT
GLOVE SURG LX 7.5 STRW (GLOVE) ×1
GLOVE SURG LX STRL 7.5 STRW (GLOVE) ×1 IMPLANT
GLOVE SURG SYN 8.5  E (GLOVE) ×1
GLOVE SURG SYN 8.5 E (GLOVE) ×1 IMPLANT
GLOVE SURG SYN 8.5 PF PI (GLOVE) ×1 IMPLANT
GOWN STRL REUS W/ TWL LRG LVL3 (GOWN DISPOSABLE) ×2 IMPLANT
GOWN STRL REUS W/TWL LRG LVL3 (GOWN DISPOSABLE) ×2
LENS IOL TECNIS ITEC 20.0 (Intraocular Lens) ×1 IMPLANT
MARKER SKIN DUAL TIP RULER LAB (MISCELLANEOUS) ×2 IMPLANT
PACK DR. KING ARMS (PACKS) ×2 IMPLANT
PACK EYE AFTER SURG (MISCELLANEOUS) ×2 IMPLANT
PACK OPTHALMIC (MISCELLANEOUS) ×2 IMPLANT
SYR 3ML LL SCALE MARK (SYRINGE) ×2 IMPLANT
SYR TB 1ML LUER SLIP (SYRINGE) ×2 IMPLANT
WATER STERILE IRR 250ML POUR (IV SOLUTION) ×2 IMPLANT
WIPE NON LINTING 3.25X3.25 (MISCELLANEOUS) ×2 IMPLANT

## 2019-05-03 NOTE — Anesthesia Preprocedure Evaluation (Signed)
Anesthesia Evaluation  Patient identified by MRN, date of birth, ID band Patient awake    Reviewed: Allergy & Precautions, H&P , NPO status , Patient's Chart, lab work & pertinent test results  Airway Mallampati: II  TM Distance: >3 FB Neck ROM: full    Dental no notable dental hx. (+) Upper Dentures   Pulmonary sleep apnea and Continuous Positive Airway Pressure Ventilation , COPD, Current Smoker,    Pulmonary exam normal breath sounds clear to auscultation       Cardiovascular hypertension, + CAD  Normal cardiovascular exam Rhythm:regular Rate:Normal     Neuro/Psych    GI/Hepatic GERD  ,  Endo/Other  diabetes  Renal/GU      Musculoskeletal   Abdominal   Peds  Hematology   Anesthesia Other Findings   Reproductive/Obstetrics                             Anesthesia Physical Anesthesia Plan  ASA: III  Anesthesia Plan: MAC   Post-op Pain Management:    Induction:   PONV Risk Score and Plan: 2 and Midazolam, TIVA and Treatment may vary due to age or medical condition  Airway Management Planned:   Additional Equipment:   Intra-op Plan:   Post-operative Plan:   Informed Consent: I have reviewed the patients History and Physical, chart, labs and discussed the procedure including the risks, benefits and alternatives for the proposed anesthesia with the patient or authorized representative who has indicated his/her understanding and acceptance.       Plan Discussed with: CRNA  Anesthesia Plan Comments:         Anesthesia Quick Evaluation

## 2019-05-03 NOTE — H&P (Signed)

## 2019-05-03 NOTE — Transfer of Care (Signed)
Immediate Anesthesia Transfer of Care Note  Patient: Kim Harding  Procedure(s) Performed: CATARACT EXTRACTION PHACO AND INTRAOCULAR LENS PLACEMENT (IOC) RIGHT DIABETIC 01:07.6  9.8%  6.77 (Right Eye)  Patient Location: PACU  Anesthesia Type: MAC  Level of Consciousness: awake, alert  and patient cooperative  Airway and Oxygen Therapy: Patient Spontanous Breathing and Patient connected to supplemental oxygen  Post-op Assessment: Post-op Vital signs reviewed, Patient's Cardiovascular Status Stable, Respiratory Function Stable, Patent Airway and No signs of Nausea or vomiting  Post-op Vital Signs: Reviewed and stable  Complications: No apparent anesthesia complications

## 2019-05-03 NOTE — Anesthesia Postprocedure Evaluation (Signed)
Anesthesia Post Note  Patient: Kim Harding  Procedure(s) Performed: CATARACT EXTRACTION PHACO AND INTRAOCULAR LENS PLACEMENT (IOC) RIGHT DIABETIC 01:07.6  9.8%  6.77 (Right Eye)  Patient location during evaluation: PACU Anesthesia Type: MAC Level of consciousness: awake and alert and oriented Pain management: satisfactory to patient Vital Signs Assessment: post-procedure vital signs reviewed and stable Respiratory status: spontaneous breathing, nonlabored ventilation and respiratory function stable Cardiovascular status: blood pressure returned to baseline and stable Postop Assessment: Adequate PO intake and No signs of nausea or vomiting Anesthetic complications: no    Raliegh Ip

## 2019-05-03 NOTE — Op Note (Signed)
OPERATIVE NOTE  Kim Harding [id number] 05/03/2019   PREOPERATIVE DIAGNOSIS:  Nuclear sclerotic cataract right eye.  H25.11   POSTOPERATIVE DIAGNOSIS:    Nuclear sclerotic cataract right eye.     PROCEDURE:  Phacoemusification with posterior chamber intraocular lens placement of the right eye   LENS:   Implant Name Type Inv. Item Serial No. Manufacturer Lot No. LRB No. Used Action  LENS IOL DIOP 20.0 - [id number] Intraocular Lens LENS IOL DIOP 20.0 [id number] AMO  Right 1 Implanted       Procedure(s) with comments: CATARACT EXTRACTION PHACO AND INTRAOCULAR LENS PLACEMENT (IOC) RIGHT DIABETIC 01:07.6  9.8%  6.77 (Right) - Latex Diabetic - oral meds sleep apnea  PCB00 +20.0    SURGEON:  Benay Pillow, MD, MPH  ANESTHESIOLOGIST: Anesthesiologist: Ronelle Nigh, MD CRNA: Jeannene Patella, CRNA; Silvana Newness, CRNA   ANESTHESIA:  Topical with tetracaine drops augmented with 1% preservative-free intracameral lidocaine.  ESTIMATED BLOOD LOSS: less than 1 mL.   COMPLICATIONS:  None.   DESCRIPTION OF PROCEDURE:  The patient was identified in the holding room and transported to the operating room and placed in the supine position under the operating microscope.  The right eye was identified as the operative eye and it was prepped and draped in the usual sterile ophthalmic fashion.   A 1.0 millimeter clear-corneal paracentesis was made at the 10:30 position. 0.5 ml of preservative-free 1% lidocaine with epinephrine was injected into the anterior chamber.  The anterior chamber was filled with Healon 5 viscoelastic.  A 2.4 millimeter keratome was used to make a near-clear corneal incision at the 8:00 position.  A curvilinear capsulorrhexis was made with a cystotome and capsulorrhexis forceps.  Balanced salt solution was used to hydrodissect and hydrodelineate the nucleus.   Phacoemulsification was then used in stop and chop fashion to remove the lens nucleus and epinucleus.  The  remaining cortex was then removed using the irrigation and aspiration handpiece. Healon was then placed into the capsular bag to distend it for lens placement.  A lens was then injected into the capsular bag.  The remaining viscoelastic was aspirated.   Wounds were hydrated with balanced salt solution.  The anterior chamber was inflated to a physiologic pressure with balanced salt solution.   Intracameral vigamox 0.1 mL undiluted was injected into the eye and a drop placed onto the ocular surface.  No wound leaks were noted.  The patient was taken to the recovery room in stable condition without complications of anesthesia or surgery  Benay Pillow 05/03/2019, 9:03 AM

## 2019-05-04 ENCOUNTER — Ambulatory Visit: Payer: Medicare HMO

## 2019-05-04 ENCOUNTER — Encounter: Payer: Self-pay | Admitting: Ophthalmology

## 2019-05-05 NOTE — Telephone Encounter (Signed)
Pt is aware as instructed that she does not need Prevnar, had it already

## 2019-05-06 ENCOUNTER — Ambulatory Visit (INDEPENDENT_AMBULATORY_CARE_PROVIDER_SITE_OTHER): Payer: Medicare HMO

## 2019-05-06 DIAGNOSIS — Z23 Encounter for immunization: Secondary | ICD-10-CM | POA: Diagnosis not present

## 2019-05-11 ENCOUNTER — Other Ambulatory Visit: Payer: Self-pay | Admitting: Internal Medicine

## 2019-05-24 ENCOUNTER — Other Ambulatory Visit: Payer: Self-pay

## 2019-05-24 ENCOUNTER — Ambulatory Visit (INDEPENDENT_AMBULATORY_CARE_PROVIDER_SITE_OTHER): Payer: Medicare HMO | Admitting: Psychiatry

## 2019-05-24 DIAGNOSIS — F331 Major depressive disorder, recurrent, moderate: Secondary | ICD-10-CM

## 2019-05-24 NOTE — Progress Notes (Signed)
Patient requested appt be rescheduled when writer tried to connect

## 2019-05-26 ENCOUNTER — Other Ambulatory Visit: Payer: Self-pay

## 2019-05-26 ENCOUNTER — Ambulatory Visit: Payer: Medicare HMO | Admitting: Internal Medicine

## 2019-05-26 ENCOUNTER — Encounter: Payer: Self-pay | Admitting: Internal Medicine

## 2019-05-26 VITALS — BP 126/68 | HR 80 | Temp 97.9°F | Ht 65.0 in | Wt 253.6 lb

## 2019-05-26 DIAGNOSIS — G4733 Obstructive sleep apnea (adult) (pediatric): Secondary | ICD-10-CM

## 2019-05-26 DIAGNOSIS — J449 Chronic obstructive pulmonary disease, unspecified: Secondary | ICD-10-CM

## 2019-05-26 NOTE — Progress Notes (Signed)
* [facility]      **Download dated 03/29/2018-04/27/2018>> raw data personally reviewed, usage greater than 4 hours is 28/30 days.  Average usage on days used is 6 hours 21 minutes.  Pressure ranges 10-16.  Median pressure 12, 95th percentile pressure 15, maximum pressure 16.  Residual apnea index is 1.  Shows good compliance with CPAP with excellent control of obstructive sleep apnea.  **Download data on/1-9/30/18. Average usage on days used was 5/33 minutes, uses greater than 4 hours a 63%, set 10-16. 95th percentile pressure is 14.4, maximum pressure is 15. Wean pressors 11.7. Residual AHI is 1.4.  **Sleep study 03/14/15; AHI of 21; 25 when supine.   Download data 06/11/2019 100% compliant for days and greater than 4 hours Excellent report AHI down to 0.8     Date: 05/26/2019  MRN# [id number] Kim Harding Aug 23, 1945   Chief complaint Follow-up sleep apnea Follow-up smoking cessation Follow-up COPD  HPI:   COPD seems to be stable at this time Seems to be under control with rescue inhaler Exercise as tolerated Uses albuterol prior to exercise   OSA seems to be very well under control Patient uses and benefits from CPAP therapy Auto CPAP 10-16 Patient has very excellent compliance report AHI is down to 0.8 from 21 Wears full facemask No leaks   Smoking Assessment and Cessation Counseling   Upon further questioning, Patient smokes 1 ppd  I have advised patient to quit/stop smoking as soon as possible due to high risk for multiple medical problems  Patient is NOT willing to quit smoking  I have advised patient that we can assist and have options of Nicotine replacement therapy. I also advised patient on behavioral therapy and can provide oral medication therapy in conjunction with the other therapies  Follow up next Office visit  for assessment of smoking cessation  Smoking cessation counseling advised for 4 minutes       Medication:     Current Outpatient Medications:  .  albuterol (PROVENTIL HFA;VENTOLIN HFA) 108 (90 Base) MCG/ACT inhaler, Inhale 2 puffs into the lungs every 6 (six) hours as needed for wheezing or shortness of breath. (Patient not taking: Reported on 04/26/2019), Disp: 6.7 Inhaler, Rfl: 3 .  Cholecalciferol (VITAMIN D-3) 1000 UNITS CAPS, Take 1 capsule by mouth daily., Disp: , Rfl:  .  esomeprazole (NEXIUM) 40 MG capsule, TAKE 1 CAPSULE BY MOUTH  DAILY AT 12 NOON, Disp: 90 capsule, Rfl: 2 .  fexofenadine (ALLEGRA) 180 MG tablet, Take 180 mg by mouth daily., Disp: , Rfl:  .  fluticasone (FLONASE) 50 MCG/ACT nasal spray, Place 2 sprays into both nostrils daily., Disp: 16 g, Rfl: 6 .  hydrochlorothiazide (HYDRODIURIL) 25 MG tablet, TAKE 1 TABLET BY MOUTH  DAILY, Disp: 90 tablet, Rfl: 2 .  metFORMIN (GLUCOPHAGE) 500 MG tablet, Take 1 tablet (500 mg total) by mouth 2 (two) times daily with a meal., Disp: 180 tablet, Rfl: 2 .  metoprolol succinate (TOPROL-XL) 100 MG 24 hr tablet, TAKE 1 TABLET BY MOUTH  DAILY WITH OR IMMEDIATELY  FOLLOWING A MEAL, Disp: 90 tablet, Rfl: 1 .  Multiple Vitamin (MULTIVITAMIN) tablet, Take 1 tablet by mouth daily., Disp: , Rfl:  .  Omega-3 Fatty Acids (FISH OIL PO), Take by mouth daily., Disp: , Rfl:  .  potassium chloride SA (K-DUR) 20 MEQ tablet, Take 1 tablet (20 mEq total) by mouth daily., Disp: 90 tablet, Rfl: 2 .  Respiratory Therapy Supplies (ALL FLOW 3000 PFT FILTER) KIT, by  Does not apply route., Disp: , Rfl:  .  rosuvastatin (CRESTOR) 10 MG tablet, TAKE 1 TABLET BY MOUTH  DAILY, Disp: 90 tablet, Rfl: 0 .  sertraline (ZOLOFT) 50 MG tablet, Take 1.5 tablets (75 mg total) by mouth daily., Disp: 135 tablet, Rfl: 0 .  Spacer/Aero Chamber Mouthpiece MISC, 1 Units by Does not apply route as needed., Disp: 1 each, Rfl: 0 .  Spacer/Aero-Holding Chambers (OPTICHAMBER DIAMOND-LG MASK) DEVI, See admin instructions. use with inhaler, Disp: , Rfl: 0   Allergies:  Aspirin, Darvon  [propoxyphene], Latex, and Naproxen   Review of Systems:  Gen:  Denies  fever, sweats, chills weight loss  HEENT: Denies blurred vision, double vision, ear pain, eye pain, hearing loss, nose bleeds, sore throat Cardiac:  No dizziness, chest pain or heaviness, chest tightness,edema, No JVD Resp:   No cough, -sputum production, -shortness of breath,-wheezing, -hemoptysis,  Gi: Denies swallowing difficulty, stomach pain, nausea or vomiting, diarrhea, constipation, bowel incontinence Gu:  Denies bladder incontinence, burning urine Ext:   Denies Joint pain, stiffness or swelling Skin: Denies  skin rash, easy bruising or bleeding or hives Endoc:  Denies polyuria, polydipsia , polyphagia or weight change Psych:   Denies depression, insomnia or hallucinations  Other:  All other systems negative   BP 126/68 (BP Location: Left Arm, Cuff Size: Normal)   Pulse 80   Temp 97.9 F (36.6 C) (Temporal)   Ht 5' 5"  (1.651 m)   Wt 253 lb 9.6 oz (115 kg)   SpO2 96%   BMI 42.20 kg/m    Physical Examination:   GENERAL:NAD, no fevers, chills, no weakness no fatigue HEAD: Normocephalic, atraumatic.  EYES: PERLA, EOMI No scleral icterus.  MOUTH: Moist mucosal membrane.  EAR, NOSE, THROAT: Clear without exudates. No external lesions.  NECK: Supple. No thyromegaly.  No JVD.  PULMONARY: CTA B/L no wheezing, rhonchi, crackles CARDIOVASCULAR: S1 and S2. Regular rate and rhythm. No murmurs GASTROINTESTINAL: Soft, nontender, nondistended. Positive bowel sounds.  MUSCULOSKELETAL: No swelling, clubbing, or edema.  NEUROLOGIC: No gross focal neurological deficits. 5/5 strength all extremities SKIN: No ulceration, lesions, rashes, or cyanosis.  PSYCHIATRIC: Insight, judgment intact. -depression -anxiety ALL OTHER ROS ARE NEGATIVE      COPD  Seems to be stable at this time  Albuterol prior to exercise advised  Continue activity as tolerated  - Pneumococcal 13 03/27/2015, pneumococcal 23 04/06/2014  Patient will need pulmonary function testing to assess lung function  OSA on CPAP previous AHI 21  continue auto CPAP 10-16  Patient uses and benefits from CPAP therapy Continue as prescribed   Smoking cessation strongly advised Patient has tried multiple medications in the past and gives her allergic reaction Patient does not want nicotine patch at this time  Obesity -recommend significant weight loss -recommend changing diet  Deconditioned state -Recommend increased daily activity and exercise  Patient is enrolled in lung cancer screening program Follow-up CT chest recommended   COVID-19 EDUCATION: The signs and symptoms of COVID-19 were discussed with the patient and how to seek care for testing.  The importance of social distancing was discussed today. Hand Washing Techniques and avoid touching face was advised.     MEDICATION ADJUSTMENTS/LABS AND TESTS ORDERED: Continue CPAP as prescribed Smoking cessation advised Obtain pulmonary function testing to assess lung function   CURRENT MEDICATIONS REVIEWED AT LENGTH WITH PATIENT TODAY   Patient satisfied with Plan of action and management. All questions answered  Follow up in 6 onths   [address],  M.D.  [facility]  Medical Director [place] Director [facility] Cardio-Pulmonary Department

## 2019-05-26 NOTE — Patient Instructions (Addendum)
Obtain PFT's Continue CPAP-EXCELLENT REPORT A+++++  STOP SMOKING!!!!!!!  Albuterol as needed

## 2019-05-27 NOTE — Progress Notes (Signed)
Virtual Visit via Video Note  I connected with Kim Harding on 05/28/19 at 10:15 AM EST by a video enabled telemedicine application and verified that I am speaking with the correct person using two identifiers.   I discussed the limitations of evaluation and management by telemedicine and the availability of in person appointments. The patient expressed understanding and agreed to proceed.     I discussed the assessment and treatment plan with the patient. The patient was provided an opportunity to ask questions and all were answered. The patient agreed with the plan and demonstrated an understanding of the instructions.   The patient was advised to call back or seek an in-person evaluation if the symptoms worsen or if the condition fails to improve as anticipated.   [facility] MD OP Progress Note  05/28/2019 12:36 PM Kim Harding  MRN:  [id number]  Chief Complaint:  Chief Complaint    Follow-up     HPI: Kim Harding is a 73 year old African-American female, widowed, lives in [place], has a history of MDD, diabetes melitis, hypertension, hyperlipidemia, OSA on CPAP, insomnia, tobacco use disorder was evaluated by telemedicine today.  Patient today reports she is currently making progress on the current medication regimen.  Patient reports the Zoloft as beneficial.  She is on a 75 mg now.  She denies any side effects.  She reports sleep is improved.  She is sleeping through the night now.  She reports she has been getting out more and has been exercising more.  She joined the exercise group at the senior center.  She reports eventually she wants to move out and get her own place.  She however reports she is currently looking for the same.  Patient reports she had cataract surgery of both eyes and is currently recovering from the same.  She is able to sleep better and currently does not have any pain.  Patient denies any suicidality, homicidality or perceptual disturbances.  She has been  cutting back on smoking cigarettes and is planning to join classes to help her stop smoking.  She has tried Chantix and Wellbutrin in the past however she had side effects.    Patient denies any other concerns today.   Visit Diagnosis:    ICD-10-CM   1. MDD (major depressive disorder), recurrent episode, moderate (HCC)  F33.1    improving  2. Insomnia due to mental condition  F51.05    stable  3. Tobacco use disorder  F17.200     Past Psychiatric History: Reviewed past psychiatric history from my progress note on 05/14/2018-Chantix, Wellbutrin  Past Medical History:  Past Medical History:  Diagnosis Date  . Allergy   . Arthritis    fingers  . Chicken pox   . COPD (chronic obstructive pulmonary disease) ([place])   . Degenerative disc disease, cervical    limited left turn of head  . Depression   . Diabetes mellitus without complication ([place])   . Diabetes mellitus, type II ([place])   . Diverticulitis   . GERD (gastroesophageal reflux disease)   . Hyperlipidemia   . Hypertension   . Schizophrenia ([place])   . Sleep apnea    CPAP  . Vertigo    1 episode - approx 2018  . Wears dentures    full upper    Past Surgical History:  Procedure Laterality Date  . ABDOMINAL HYSTERECTOMY  1994   partial  . BARIATRIC SURGERY     sleeve--2014  . CARPAL TUNNEL RELEASE    . CATARACT EXTRACTION  W/PHACO Left 04/12/2019   Procedure: CATARACT EXTRACTION PHACO AND INTRAOCULAR LENS PLACEMENT (IOC) LEFT DIABETES 00:52.3         8.4%      5.12;  Surgeon: Eulogio Bear, MD;  Location: [facility];  Service: Ophthalmology;  Laterality: Left;  Latex Diabetic - oral meds sleep apnea  . CATARACT EXTRACTION W/PHACO Right 05/03/2019   Procedure: CATARACT EXTRACTION PHACO AND INTRAOCULAR LENS PLACEMENT (IOC) RIGHT DIABETIC 01:07.6  9.8%  6.77;  Surgeon: Eulogio Bear, MD;  Location: [facility];  Service: Ophthalmology;  Laterality: Right;  Latex Diabetic - oral meds sleep apnea   . CHOLECYSTECTOMY  2012  . REPLACEMENT TOTAL KNEE BILATERAL    . ROTATOR CUFF REPAIR Right 11/21/2015  . SHOULDER SURGERY Left 09/2014   rotator cuff  . TONSILLECTOMY  1960    Family Psychiatric History: Reviewed family psychiatric history from my progress note on 05/14/2018  Family History:  Family History  Problem Relation Age of Onset  . Heart disease Mother   . Arthritis Father   . Cancer Father        Prostate  . Diabetes Sister   . Drug abuse Sister   . Diabetes Brother   . Drug abuse Brother   . Alcohol abuse Brother   . Drug abuse Brother   . Breast cancer Paternal Aunt     Social History: I have reviewed social history from my progress note on 05/14/2018 Social History   Socioeconomic History  . Marital status: Widowed    Spouse name: Not on file  . Number of children: 3  . Years of education: Not on file  . Highest education level: High school graduate  Occupational History  . Occupation: retired  Scientific laboratory technician  . Financial resource strain: Not hard at all  . Food insecurity    Worry: Never true    Inability: Never true  . Transportation needs    Medical: No    Non-medical: No  Tobacco Use  . Smoking status: Current Every Day Smoker    Packs/day: 1.00    Years: 49.00    Pack years: 49.00    Types: Cigarettes  . Smokeless tobacco: Never Used  . Tobacco comment: currently down to .5 daily- 05/26/2019  Substance and Sexual Activity  . Alcohol use: No    Alcohol/week: 0.0 standard drinks  . Drug use: No  . Sexual activity: Not Currently  Lifestyle  . Physical activity    Days per week: 3 days    Minutes per session: 60 min  . Stress: Rather much  Relationships  . Social Herbalist on phone: Not on file    Gets together: Not on file    Attends religious service: More than 4 times per year    Active member of club or organization: No    Attends meetings of clubs or organizations: Never    Relationship status: Widowed  Other Topics  Concern  . Not on file  Social History Narrative   Church activities, [place]    Allergies:  Allergies  Allergen Reactions  . Aspirin Hives and Itching  . Bupropion Hives and Itching  . Latex Hives, Itching and Swelling    Gloves when worn, some tapes  . Naproxen Hives and Itching  . Propoxyphene Hives and Itching    Metabolic Disorder Labs: Lab Results  Component Value Date   HGBA1C 6.5 02/18/2019   MPG 134 02/06/2018   No results found for:  PROLACTIN Lab Results  Component Value Date   CHOL 140 02/18/2019   TRIG 168.0 (H) 02/18/2019   HDL 42.90 02/18/2019   CHOLHDL 3 02/18/2019   VLDL 33.6 02/18/2019   LDLCALC 63 02/18/2019   LDLCALC 79 02/06/2018   Lab Results  Component Value Date   TSH 3.940 07/31/2018    Therapeutic Level Labs: No results found for: LITHIUM No results found for: VALPROATE No components found for:  CBMZ  Current Medications: Current Outpatient Medications  Medication Sig Dispense Refill  . albuterol (PROVENTIL HFA;VENTOLIN HFA) 108 (90 Base) MCG/ACT inhaler Inhale 2 puffs into the lungs every 6 (six) hours as needed for wheezing or shortness of breath. 6.7 Inhaler 3  . Cholecalciferol (VITAMIN D-3) 1000 UNITS CAPS Take 1 capsule by mouth daily.    Marland Kitchen esomeprazole (NEXIUM) 40 MG capsule TAKE 1 CAPSULE BY MOUTH  DAILY AT 12 NOON 90 capsule 2  . fexofenadine (ALLEGRA) 180 MG tablet Take 180 mg by mouth daily.    . fluticasone (FLONASE) 50 MCG/ACT nasal spray Place 2 sprays into both nostrils daily. 16 g 6  . hydrochlorothiazide (HYDRODIURIL) 25 MG tablet TAKE 1 TABLET BY MOUTH  DAILY 90 tablet 2  . metFORMIN (GLUCOPHAGE) 500 MG tablet Take 1 tablet (500 mg total) by mouth 2 (two) times daily with a meal. 180 tablet 2  . metoprolol succinate (TOPROL-XL) 100 MG 24 hr tablet TAKE 1 TABLET BY MOUTH  DAILY WITH OR IMMEDIATELY  FOLLOWING A MEAL 90 tablet 1  . Multiple Vitamin (MULTIVITAMIN) tablet Take 1 tablet by mouth daily.    . Omega-3 Fatty  Acids (FISH OIL PO) Take by mouth daily.    . potassium chloride SA (K-DUR) 20 MEQ tablet Take 1 tablet (20 mEq total) by mouth daily. 90 tablet 2  . Respiratory Therapy Supplies (ALL FLOW 3000 PFT FILTER) KIT by Does not apply route.    . rosuvastatin (CRESTOR) 10 MG tablet TAKE 1 TABLET BY MOUTH  DAILY 90 tablet 0  . sertraline (ZOLOFT) 50 MG tablet Take 1.5 tablets (75 mg total) by mouth daily. 135 tablet 0  . Spacer/Aero Chamber Mouthpiece MISC 1 Units by Does not apply route as needed. 1 each 0  . Spacer/Aero-Holding Chambers (OPTICHAMBER DIAMOND-LG MASK) DEVI See admin instructions. use with inhaler  0   No current facility-administered medications for this visit.      Musculoskeletal: Strength & Muscle Tone: UTA Gait & Station: Observed as seated Patient leans: N/A  Psychiatric Specialty Exam: Review of Systems  Psychiatric/Behavioral: Negative for depression, hallucinations, substance abuse and suicidal ideas. The patient is not nervous/anxious.   All other systems reviewed and are negative.   There were no vitals taken for this visit.There is no height or weight on file to calculate BMI.  General Appearance: Casual  Eye Contact:  Fair  Speech:  Clear and Coherent  Volume:  Normal  Mood:  Euthymic  Affect:  Congruent  Thought Process:  Goal Directed and Descriptions of Associations: Intact  Orientation:  Full (Time, Place, and Person)  Thought Content: Logical   Suicidal Thoughts:  No  Homicidal Thoughts:  No  Memory:  Immediate;   Fair Recent;   Fair Remote;   Fair  Judgement:  Fair  Insight:  Fair  Psychomotor Activity:  Normal  Concentration:  Concentration: Fair and Attention Span: Fair  Recall:  [employer] of Knowledge: Fair  Language: Fair  Akathisia:  No  Handed:  Right  AIMS (if indicated):  denies tremors, rigidity  Assets:  Communication Skills Desire for Improvement Housing Social Support  ADL's:  Intact  Cognition: WNL  Sleep:  improving    Screenings: PHQ2-9     Office Visit from 02/18/2019 in [place] at [facility] Visit from 02/06/2018 in [place] at [facility] Visit from 02/04/2017 in [place] at [facility] Visit from 12/13/2015 in [place] at [facility] from 02/07/2015 in [place] at [facility]  PHQ-2 Total Score  0  0  1  0  0  PHQ-9 Total Score  0  -  4  -  -       Assessment and Plan: Ashaunte is a 73 year old African-American female, widowed, lives in [place], has a history of depression, tobacco use disorder, OSA on CPAP, hypertension, hyperlipidemia, diabetes melitis was evaluated by telemedicine today.  She is biologically predisposed given her family history of mental health problems.  She also has a history of trauma.  Patient is currently making progress on the current medication regimen.  Plan as noted below.  Plan MDD-improving Zoloft 75 mg p.o. daily Patient was advised to restart psychotherapy sessions with Ms. Peacock last visit  Insomnia-improved Discussed sleep hygiene techniques. Patient declined medications.  Tobacco use disorder-improving She will continue to cut back Provided smoking cessation counseling. She is planning to join smoking cessation classes.  Follow-up in clinic in 6 to 8 weeks or sooner if needed.  December 28 at 2:20 PM  I have spent atleast 15 minutes non face to face with patient today. More than 50 % of the time was spent for psychoeducation and supportive psychotherapy and care coordination. This note was generated in part or whole with voice recognition software. Voice recognition is usually quite accurate but there are transcription errors that can and very often do occur. I apologize for any typographical errors that were not detected and corrected.          Ursula Alert, MD 05/28/2019, 12:36 PM

## 2019-05-28 ENCOUNTER — Ambulatory Visit (INDEPENDENT_AMBULATORY_CARE_PROVIDER_SITE_OTHER): Payer: Medicare HMO | Admitting: Psychiatry

## 2019-05-28 ENCOUNTER — Encounter: Payer: Self-pay | Admitting: Psychiatry

## 2019-05-28 ENCOUNTER — Other Ambulatory Visit: Payer: Self-pay

## 2019-05-28 ENCOUNTER — Telehealth: Payer: Self-pay | Admitting: *Deleted

## 2019-05-28 DIAGNOSIS — F5105 Insomnia due to other mental disorder: Secondary | ICD-10-CM | POA: Diagnosis not present

## 2019-05-28 DIAGNOSIS — F331 Major depressive disorder, recurrent, moderate: Secondary | ICD-10-CM

## 2019-05-28 DIAGNOSIS — F17200 Nicotine dependence, unspecified, uncomplicated: Secondary | ICD-10-CM

## 2019-05-28 DIAGNOSIS — Z87891 Personal history of nicotine dependence: Secondary | ICD-10-CM

## 2019-05-28 DIAGNOSIS — R69 Illness, unspecified: Secondary | ICD-10-CM | POA: Diagnosis not present

## 2019-05-28 DIAGNOSIS — Z122 Encounter for screening for malignant neoplasm of respiratory organs: Secondary | ICD-10-CM

## 2019-05-28 NOTE — Telephone Encounter (Signed)
Patient has been notified that annual lung cancer screening low dose CT scan is due currently or will be in near future. Confirmed that patient is within the age range of 55-77, and asymptomatic, (no signs or symptoms of lung cancer). Patient denies illness that would prevent curative treatment for lung cancer if found. Verified smoking history, (current, 49.5 pack year). The shared decision making visit was done 05/28/18. Patient is agreeable for CT scan being scheduled.

## 2019-06-04 ENCOUNTER — Other Ambulatory Visit: Payer: Self-pay

## 2019-06-04 ENCOUNTER — Ambulatory Visit
Admission: RE | Admit: 2019-06-04 | Discharge: 2019-06-04 | Disposition: A | Discharge status: Home / Self Care | Payer: Medicare HMO | Source: Ambulatory Visit | Attending: Nurse Practitioner | Admitting: Nurse Practitioner

## 2019-06-04 DIAGNOSIS — Z122 Encounter for screening for malignant neoplasm of respiratory organs: Secondary | ICD-10-CM | POA: Diagnosis not present

## 2019-06-04 DIAGNOSIS — Z87891 Personal history of nicotine dependence: Secondary | ICD-10-CM | POA: Diagnosis not present

## 2019-06-04 DIAGNOSIS — R69 Illness, unspecified: Secondary | ICD-10-CM | POA: Diagnosis not present

## 2019-06-07 ENCOUNTER — Ambulatory Visit
Admission: RE | Admit: 2019-06-07 | Discharge: 2019-06-07 | Disposition: A | Discharge status: Home / Self Care | Payer: Medicare HMO | Source: Ambulatory Visit | Attending: Internal Medicine | Admitting: Internal Medicine

## 2019-06-07 DIAGNOSIS — H2000 Unspecified acute and subacute iridocyclitis: Secondary | ICD-10-CM | POA: Diagnosis not present

## 2019-06-07 DIAGNOSIS — Z1231 Encounter for screening mammogram for malignant neoplasm of breast: Secondary | ICD-10-CM | POA: Insufficient documentation

## 2019-06-08 ENCOUNTER — Encounter: Payer: Self-pay | Admitting: *Deleted

## 2019-06-16 DIAGNOSIS — Z961 Presence of intraocular lens: Secondary | ICD-10-CM | POA: Diagnosis not present

## 2019-06-18 ENCOUNTER — Other Ambulatory Visit: Payer: Self-pay | Admitting: Internal Medicine

## 2019-06-24 DIAGNOSIS — I2510 Atherosclerotic heart disease of native coronary artery without angina pectoris: Secondary | ICD-10-CM | POA: Diagnosis not present

## 2019-06-24 DIAGNOSIS — I119 Hypertensive heart disease without heart failure: Secondary | ICD-10-CM | POA: Diagnosis not present

## 2019-06-24 DIAGNOSIS — E782 Mixed hyperlipidemia: Secondary | ICD-10-CM | POA: Diagnosis not present

## 2019-06-24 DIAGNOSIS — I10 Essential (primary) hypertension: Secondary | ICD-10-CM | POA: Diagnosis not present

## 2019-06-24 DIAGNOSIS — I6523 Occlusion and stenosis of bilateral carotid arteries: Secondary | ICD-10-CM | POA: Diagnosis not present

## 2019-07-06 ENCOUNTER — Other Ambulatory Visit: Payer: Self-pay | Admitting: Psychiatry

## 2019-07-06 DIAGNOSIS — F331 Major depressive disorder, recurrent, moderate: Secondary | ICD-10-CM

## 2019-07-19 ENCOUNTER — Other Ambulatory Visit: Payer: Self-pay

## 2019-07-19 ENCOUNTER — Encounter: Payer: Self-pay | Admitting: Psychiatry

## 2019-07-19 ENCOUNTER — Ambulatory Visit (INDEPENDENT_AMBULATORY_CARE_PROVIDER_SITE_OTHER): Payer: Medicare HMO | Admitting: Psychiatry

## 2019-07-19 DIAGNOSIS — F3342 Major depressive disorder, recurrent, in full remission: Secondary | ICD-10-CM | POA: Diagnosis not present

## 2019-07-19 DIAGNOSIS — F17200 Nicotine dependence, unspecified, uncomplicated: Secondary | ICD-10-CM

## 2019-07-19 DIAGNOSIS — R69 Illness, unspecified: Secondary | ICD-10-CM | POA: Diagnosis not present

## 2019-07-19 DIAGNOSIS — F5105 Insomnia due to other mental disorder: Secondary | ICD-10-CM

## 2019-07-19 NOTE — Progress Notes (Signed)
Virtual Visit via Telephone Note  I connected with Kim Harding on 07/19/19 at  2:20 PM EST by telephone and verified that I am speaking with the correct person using two identifiers.   I discussed the limitations, risks, security and privacy concerns of performing an evaluation and management service by telephone and the availability of in person appointments. I also discussed with the patient that there may be a patient responsible charge related to this service. The patient expressed understanding and agreed to proceed.     I discussed the assessment and treatment plan with the patient. The patient was provided an opportunity to ask questions and all were answered. The patient agreed with the plan and demonstrated an understanding of the instructions.   The patient was advised to call back or seek an in-person evaluation if the symptoms worsen or if the condition fails to improve as anticipated.  [facility] MD OP Progress Note  07/19/2019 5:35 PM Kim Harding  MRN:  [id number]  Chief Complaint:  Chief Complaint    Follow-up     HPI: Kim Harding is a 73 year old African-American female, widowed, lives in [place], has a history of MDD, diabetes melitis, hypertension, hyperlipidemia, OSA on CPAP, insomnia, tobacco use disorder was evaluated by phone today.  Patient preferred to do a phone call.  Patient today reports she is currently doing well on the current medication.  Denies any significant depression or anxiety symptoms.  She denies side effects to the Zoloft.  Patient reports sleep is good.  Patient continues to cut back on smoking cigarettes and currently makes use of nicotine lozenges.  Patient denies any suicidality, homicidality or perceptual disturbances.  Patient denies any other concerns today. Visit Diagnosis:    ICD-10-CM   1. MDD (major depressive disorder), recurrent, in full remission ([place])  F33.42   2. Insomnia due to mental condition  F51.05   3. Tobacco use disorder   F17.200     Past Psychiatric History: Reviewed past psychiatric history from my progress note on 05/14/2018-Chantix, Wellbutrin.  Past Medical History:  Past Medical History:  Diagnosis Date  . Allergy   . Arthritis    fingers  . Chicken pox   . COPD (chronic obstructive pulmonary disease) ([place])   . Degenerative disc disease, cervical    limited left turn of head  . Depression   . Diabetes mellitus without complication ([place])   . Diabetes mellitus, type II ([place])   . Diverticulitis   . GERD (gastroesophageal reflux disease)   . Hyperlipidemia   . Hypertension   . Schizophrenia ([place])   . Sleep apnea    CPAP  . Vertigo    1 episode - approx 2018  . Wears dentures    full upper    Past Surgical History:  Procedure Laterality Date  . ABDOMINAL HYSTERECTOMY  1994   partial  . BARIATRIC SURGERY     sleeve--2014  . CARPAL TUNNEL RELEASE    . CATARACT EXTRACTION W/PHACO Left 04/12/2019   Procedure: CATARACT EXTRACTION PHACO AND INTRAOCULAR LENS PLACEMENT (IOC) LEFT DIABETES 00:52.3         8.4%      5.12;  Surgeon: Eulogio Bear, MD;  Location: [facility];  Service: Ophthalmology;  Laterality: Left;  Latex Diabetic - oral meds sleep apnea  . CATARACT EXTRACTION W/PHACO Right 05/03/2019   Procedure: CATARACT EXTRACTION PHACO AND INTRAOCULAR LENS PLACEMENT (IOC) RIGHT DIABETIC 01:07.6  9.8%  6.77;  Surgeon: Eulogio Bear, MD;  Location: [facility];  Service: Ophthalmology;  Laterality: Right;  Latex Diabetic - oral meds sleep apnea  . CHOLECYSTECTOMY  2012  . REPLACEMENT TOTAL KNEE BILATERAL    . ROTATOR CUFF REPAIR Right 11/21/2015  . SHOULDER SURGERY Left 09/2014   rotator cuff  . TONSILLECTOMY  1960    Family Psychiatric History: Reviewed family psychiatric history from my progress note on 05/14/2018.  Family History:  Family History  Problem Relation Age of Onset  . Heart disease Mother   . Arthritis Father   . Cancer Father        Prostate   . Diabetes Sister   . Drug abuse Sister   . Diabetes Brother   . Drug abuse Brother   . Alcohol abuse Brother   . Drug abuse Brother   . Breast cancer Paternal Aunt     Social History: Reviewed social history from my progress note on 05/14/2018. Social History   Socioeconomic History  . Marital status: Widowed    Spouse name: Not on file  . Number of children: 3  . Years of education: Not on file  . Highest education level: High school graduate  Occupational History  . Occupation: retired  Tobacco Use  . Smoking status: Current Every Day Smoker    Packs/day: 1.00    Years: 49.00    Pack years: 49.00    Types: Cigarettes  . Smokeless tobacco: Never Used  . Tobacco comment: currently down to .5 daily- 05/26/2019  Substance and Sexual Activity  . Alcohol use: No    Alcohol/week: 0.0 standard drinks  . Drug use: No  . Sexual activity: Not Currently  Other Topics Concern  . Not on file  Social History Narrative   Church activities, [employer]   Social Determinants of Health   Financial Resource Strain:   . Difficulty of Paying Living Expenses: Not on file  Food Insecurity:   . Worried About Charity fundraiser in the Last Year: Not on file  . Ran Out of Food in the Last Year: Not on file  Transportation Needs:   . Lack of Transportation (Medical): Not on file  . Lack of Transportation (Non-Medical): Not on file  Physical Activity:   . Days of Exercise per Week: Not on file  . Minutes of Exercise per Session: Not on file  Stress:   . Feeling of Stress : Not on file  Social Connections:   . Frequency of Communication with Friends and Family: Not on file  . Frequency of Social Gatherings with Friends and Family: Not on file  . Attends Religious Services: Not on file  . Active Member of Clubs or Organizations: Not on file  . Attends Archivist Meetings: Not on file  . Marital Status: Not on file    Allergies:  Allergies  Allergen Reactions  .  Aspirin Hives and Itching  . Bupropion Hives and Itching  . Latex Hives, Itching and Swelling    Gloves when worn, some tapes  . Naproxen Hives and Itching  . Propoxyphene Hives and Itching    Metabolic Disorder Labs: Lab Results  Component Value Date   HGBA1C 6.5 02/18/2019   MPG 134 02/06/2018   No results found for: PROLACTIN Lab Results  Component Value Date   CHOL 140 02/18/2019   TRIG 168.0 (H) 02/18/2019   HDL 42.90 02/18/2019   CHOLHDL 3 02/18/2019   VLDL 33.6 02/18/2019   LDLCALC 63 02/18/2019   LDLCALC 79 02/06/2018   Lab Results  Component Value Date   TSH 3.940 07/31/2018    Therapeutic Level Labs: No results found for: LITHIUM No results found for: VALPROATE No components found for:  CBMZ  Current Medications: Current Outpatient Medications  Medication Sig Dispense Refill  . albuterol (PROVENTIL HFA;VENTOLIN HFA) 108 (90 Base) MCG/ACT inhaler Inhale 2 puffs into the lungs every 6 (six) hours as needed for wheezing or shortness of breath. 6.7 Inhaler 3  . Cholecalciferol (VITAMIN D-3) 1000 UNITS CAPS Take 1 capsule by mouth daily.    Marland Kitchen esomeprazole (NEXIUM) 40 MG capsule TAKE 1 CAPSULE BY MOUTH  DAILY AT 12 NOON 90 capsule 2  . fexofenadine (ALLEGRA) 180 MG tablet Take 180 mg by mouth daily.    . fluticasone (FLONASE) 50 MCG/ACT nasal spray Place 2 sprays into both nostrils daily. 16 g 6  . hydrochlorothiazide (HYDRODIURIL) 25 MG tablet TAKE 1 TABLET BY MOUTH  DAILY 90 tablet 2  . metFORMIN (GLUCOPHAGE) 500 MG tablet Take 1 tablet (500 mg total) by mouth 2 (two) times daily with a meal. 180 tablet 2  . metoprolol succinate (TOPROL-XL) 100 MG 24 hr tablet TAKE 1 TABLET BY MOUTH  DAILY WITH OR IMMEDIATELY  FOLLOWING A MEAL 90 tablet 2  . Multiple Vitamin (MULTIVITAMIN) tablet Take 1 tablet by mouth daily.    . Omega-3 Fatty Acids (FISH OIL PO) Take by mouth daily.    . potassium chloride SA (K-DUR) 20 MEQ tablet Take 1 tablet (20 mEq total) by mouth daily. 90  tablet 2  . Respiratory Therapy Supplies (ALL FLOW 3000 PFT FILTER) KIT by Does not apply route.    . rosuvastatin (CRESTOR) 10 MG tablet TAKE 1 TABLET BY MOUTH  DAILY 90 tablet 0  . sertraline (ZOLOFT) 50 MG tablet TAKE 1 AND 1/2 TABLETS BY MOUTH DAILY 135 tablet 0  . Spacer/Aero Chamber Mouthpiece MISC 1 Units by Does not apply route as needed. 1 each 0  . Spacer/Aero-Holding Chambers (OPTICHAMBER DIAMOND-LG MASK) DEVI See admin instructions. use with inhaler  0   No current facility-administered medications for this visit.     Musculoskeletal: Strength & Muscle Tone: UTA Gait & Station: Reports as WNL Patient leans: N/A  Psychiatric Specialty Exam: Review of Systems  Psychiatric/Behavioral: Negative for agitation, behavioral problems, confusion, decreased concentration, dysphoric mood, hallucinations, self-injury, sleep disturbance and suicidal ideas. The patient is not nervous/anxious and is not hyperactive.   All other systems reviewed and are negative.   There were no vitals taken for this visit.There is no height or weight on file to calculate BMI.  General Appearance: UTA  Eye Contact:  UTA  Speech:  Clear and Coherent  Volume:  Normal  Mood:  Euthymic  Affect:  UTA  Thought Process:  Goal Directed and Descriptions of Associations: Intact  Orientation:  Full (Time, Place, and Person)  Thought Content: Logical   Suicidal Thoughts:  No  Homicidal Thoughts:  No  Memory:  Immediate;   Fair Recent;   Fair Remote;   Fair  Judgement:  Other:  UTA  Insight:  Fair  Psychomotor Activity:  UTA  Concentration:  Concentration: Fair and Attention Span: Fair  Recall:  [employer] of Knowledge: Fair  Language: Fair  Akathisia:  No  Handed:  Right  AIMS (if indicated): Denies tremors, rigidity  Assets:  Communication Skills Desire for Improvement Housing Social Support Talents/Skills  ADL's:  Intact  Cognition: WNL  Sleep:  Fair   Screenings: PHQ2-9     Office Visit  from 02/18/2019 in [place] at [facility] Visit from 02/06/2018 in [place] at [facility] Visit from 02/04/2017 in [place] at [facility] Visit from 12/13/2015 in [place] at [facility] Visit from 02/07/2015 in [place] at [facility]  PHQ-2 Total Score  0  0  1  0  0  PHQ-9 Total Score  0  --  4  --  --       Assessment and Plan: Bassy is a 73 year old African-American female, widowed, lives in [place], has a history of depression, tobacco use disorder, OSA on CPAP, hypertension, hyperlipidemia, diabetes melitis was evaluated by telemedicine today.  She is biologically predisposed given her family history of mental health problems.  She also has a history of trauma.  Patient however is currently doing well on the current medication regimen.  Plan as noted below.  Plan MDD in remission Zoloft 75 mg p.o. daily.   Insomnia-stable Continue sleep hygiene techniques  Tobacco use disorder-improving Patient is currently cutting back and uses nicotine lozenges. Provided smoking cessation counseling.  Follow-up in clinic in 3 months or sooner if needed.  March 4 at 11 AM  I have spent atleast 15 minutes non - face to face with patient today. More than 50 % of the time was spent for psychoeducation and supportive psychotherapy and care coordination. This note was generated in part or whole with voice recognition software. Voice recognition is usually quite accurate but there are transcription errors that can and very often do occur. I apologize for any typographical errors that were not detected and corrected.       Ursula Alert, MD 07/19/2019, 5:35 PM

## 2019-07-28 ENCOUNTER — Other Ambulatory Visit: Payer: Self-pay | Admitting: Internal Medicine

## 2019-09-23 ENCOUNTER — Ambulatory Visit (INDEPENDENT_AMBULATORY_CARE_PROVIDER_SITE_OTHER): Payer: Medicare HMO | Admitting: Psychiatry

## 2019-09-23 ENCOUNTER — Encounter: Payer: Self-pay | Admitting: Psychiatry

## 2019-09-23 ENCOUNTER — Other Ambulatory Visit: Payer: Self-pay

## 2019-09-23 DIAGNOSIS — F17200 Nicotine dependence, unspecified, uncomplicated: Secondary | ICD-10-CM | POA: Diagnosis not present

## 2019-09-23 DIAGNOSIS — F3342 Major depressive disorder, recurrent, in full remission: Secondary | ICD-10-CM | POA: Diagnosis not present

## 2019-09-23 DIAGNOSIS — F5105 Insomnia due to other mental disorder: Secondary | ICD-10-CM | POA: Diagnosis not present

## 2019-09-23 MED ORDER — SERTRALINE HCL 50 MG PO TABS: 75.0000 mg | ORAL_TABLET | Freq: Every day | ORAL | 0 refills | Status: DC

## 2019-09-23 NOTE — Progress Notes (Signed)
Provider Location : [facility] Patient Location : [facility]  Virtual Visit via Telephone Note  I connected with Kim Harding on 09/23/19 at 11:00 AM EST by telephone and verified that I am speaking with the correct person using two identifiers.   I discussed the limitations, risks, security and privacy concerns of performing an evaluation and management service by telephone and the availability of in person appointments. I also discussed with the patient that there may be a patient responsible charge related to this service. The patient expressed understanding and agreed to proceed.    I discussed the assessment and treatment plan with the patient. The patient was provided an opportunity to ask questions and all were answered. The patient agreed with the plan and demonstrated an understanding of the instructions.   The patient was advised to call back or seek an in-person evaluation if the symptoms worsen or if the condition fails to improve as anticipated. [place] MD OP Progress Note  09/23/2019 12:21 PM Kim Harding  MRN:  [id number]  Chief Complaint:  Chief Complaint    Follow-up     HPI: Kim Harding is a 74 year old African-American female, widowed, lives in [place], has a history of MDD, diabetes melitis, hypertension, hyperlipidemia, OSA on CPAP, insomnia, tobacco use disorder was evaluated by phone today.  Patient preferred to do a phone call.  Patient today reports she is currently doing well on the current medication regimen.  She denies any significant depression.  She reports she has good energy and feels motivated.  She reports sleep is good.  Patient reports she has been cutting back on smoking cigarettes and currently down to 3 cigarettes/day which is an improvement for her.  Patient reports she was able to get her COVID-19 vaccine.  She tolerated it well.  Patient denies any suicidality, homicidality or perceptual disturbances.  Patient denies any other concerns  today.   Visit Diagnosis:    ICD-10-CM   1. MDD (major depressive disorder), recurrent, in full remission ([place])  F33.42   2. Insomnia due to mental condition  F51.05 sertraline (ZOLOFT) 50 MG tablet  3. Tobacco use disorder  F17.200     Past Psychiatric History: I have reviewed past psychiatric history from my progress note on 05/14/2018-Chantix, Wellbutrin.  Past Medical History:  Past Medical History:  Diagnosis Date  . Allergy   . Arthritis    fingers  . Chicken pox   . COPD (chronic obstructive pulmonary disease) ([place])   . Degenerative disc disease, cervical    limited left turn of head  . Depression   . Diabetes mellitus without complication ([place])   . Diabetes mellitus, type II ([place])   . Diverticulitis   . GERD (gastroesophageal reflux disease)   . Hyperlipidemia   . Hypertension   . Schizophrenia ([place])   . Sleep apnea    CPAP  . Vertigo    1 episode - approx 2018  . Wears dentures    full upper    Past Surgical History:  Procedure Laterality Date  . ABDOMINAL HYSTERECTOMY  1994   partial  . BARIATRIC SURGERY     sleeve--2014  . CARPAL TUNNEL RELEASE    . CATARACT EXTRACTION W/PHACO Left 04/12/2019   Procedure: CATARACT EXTRACTION PHACO AND INTRAOCULAR LENS PLACEMENT (IOC) LEFT DIABETES 00:52.3         8.4%      5.12;  Surgeon: Eulogio Bear, MD;  Location: [facility];  Service: Ophthalmology;  Laterality: Left;  Latex Diabetic -  oral meds sleep apnea  . CATARACT EXTRACTION W/PHACO Right 05/03/2019   Procedure: CATARACT EXTRACTION PHACO AND INTRAOCULAR LENS PLACEMENT (IOC) RIGHT DIABETIC 01:07.6  9.8%  6.77;  Surgeon: Eulogio Bear, MD;  Location: [facility];  Service: Ophthalmology;  Laterality: Right;  Latex Diabetic - oral meds sleep apnea  . CHOLECYSTECTOMY  2012  . REPLACEMENT TOTAL KNEE BILATERAL    . ROTATOR CUFF REPAIR Right 11/21/2015  . SHOULDER SURGERY Left 09/2014   rotator cuff  . TONSILLECTOMY  1960    Family  Psychiatric History: I have reviewed family psychiatric history from my progress note on 05/14/2018  Family History:  Family History  Problem Relation Age of Onset  . Heart disease Mother   . Arthritis Father   . Cancer Father        Prostate  . Diabetes Sister   . Drug abuse Sister   . Diabetes Brother   . Drug abuse Brother   . Alcohol abuse Brother   . Drug abuse Brother   . Breast cancer Paternal Aunt     Social History: Reviewed social history from my progress note on 05/14/2018 Social History   Socioeconomic History  . Marital status: Widowed    Spouse name: Not on file  . Number of children: 3  . Years of education: Not on file  . Highest education level: High school graduate  Occupational History  . Occupation: retired  Tobacco Use  . Smoking status: Current Every Day Smoker    Packs/day: 1.00    Years: 49.00    Pack years: 49.00    Types: Cigarettes  . Smokeless tobacco: Never Used  . Tobacco comment: CURRENTLY DOWN TO 3 CIG PER DAY - 09/23/2019  Substance and Sexual Activity  . Alcohol use: No    Alcohol/week: 0.0 standard drinks  . Drug use: No  . Sexual activity: Not Currently  Other Topics Concern  . Not on file  Social History Narrative   Church activities, [employer]   Social Determinants of Health   Financial Resource Strain:   . Difficulty of Paying Living Expenses: Not on file  Food Insecurity:   . Worried About Charity fundraiser in the Last Year: Not on file  . Ran Out of Food in the Last Year: Not on file  Transportation Needs:   . Lack of Transportation (Medical): Not on file  . Lack of Transportation (Non-Medical): Not on file  Physical Activity:   . Days of Exercise per Week: Not on file  . Minutes of Exercise per Session: Not on file  Stress:   . Feeling of Stress : Not on file  Social Connections:   . Frequency of Communication with Friends and Family: Not on file  . Frequency of Social Gatherings with Friends and Family: Not  on file  . Attends Religious Services: Not on file  . Active Member of Clubs or Organizations: Not on file  . Attends Archivist Meetings: Not on file  . Marital Status: Not on file    Allergies:  Allergies  Allergen Reactions  . Aspirin Hives and Itching  . Bupropion Hives and Itching  . Latex Hives, Itching and Swelling    Gloves when worn, some tapes  . Naproxen Hives and Itching  . Propoxyphene Hives and Itching    Metabolic Disorder Labs: Lab Results  Component Value Date   HGBA1C 6.5 02/18/2019   MPG 134 02/06/2018   No results found for: PROLACTIN Lab Results  Component Value Date   CHOL 140 02/18/2019   TRIG 168.0 (H) 02/18/2019   HDL 42.90 02/18/2019   CHOLHDL 3 02/18/2019   VLDL 33.6 02/18/2019   LDLCALC 63 02/18/2019   LDLCALC 79 02/06/2018   Lab Results  Component Value Date   TSH 3.940 07/31/2018    Therapeutic Level Labs: No results found for: LITHIUM No results found for: VALPROATE No components found for:  CBMZ  Current Medications: Current Outpatient Medications  Medication Sig Dispense Refill  . albuterol (PROVENTIL HFA;VENTOLIN HFA) 108 (90 Base) MCG/ACT inhaler Inhale 2 puffs into the lungs every 6 (six) hours as needed for wheezing or shortness of breath. 6.7 Inhaler 3  . Cholecalciferol (VITAMIN D-3) 1000 UNITS CAPS Take 1 capsule by mouth daily.    Marland Kitchen esomeprazole (NEXIUM) 40 MG capsule TAKE 1 CAPSULE BY MOUTH  DAILY AT 12 NOON 90 capsule 2  . fexofenadine (ALLEGRA) 180 MG tablet Take 180 mg by mouth daily.    . fluticasone (FLONASE) 50 MCG/ACT nasal spray Place 2 sprays into both nostrils daily. 16 g 6  . hydrochlorothiazide (HYDRODIURIL) 25 MG tablet TAKE 1 TABLET BY MOUTH  DAILY 90 tablet 2  . metFORMIN (GLUCOPHAGE) 500 MG tablet Take 1 tablet (500 mg total) by mouth 2 (two) times daily with a meal. 180 tablet 2  . metoprolol succinate (TOPROL-XL) 100 MG 24 hr tablet TAKE 1 TABLET BY MOUTH  DAILY WITH OR IMMEDIATELY  FOLLOWING  A MEAL 90 tablet 2  . Multiple Vitamin (MULTIVITAMIN) tablet Take 1 tablet by mouth daily.    . Omega-3 Fatty Acids (FISH OIL PO) Take by mouth daily.    . potassium chloride SA (K-DUR) 20 MEQ tablet Take 1 tablet (20 mEq total) by mouth daily. 90 tablet 2  . Respiratory Therapy Supplies (ALL FLOW 3000 PFT FILTER) KIT by Does not apply route.    . rosuvastatin (CRESTOR) 10 MG tablet TAKE 1 TABLET BY MOUTH  DAILY 90 tablet 1  . sertraline (ZOLOFT) 50 MG tablet Take 1.5 tablets (75 mg total) by mouth daily. 135 tablet 0  . Spacer/Aero Chamber Mouthpiece MISC 1 Units by Does not apply route as needed. 1 each 0  . Spacer/Aero-Holding Chambers (OPTICHAMBER DIAMOND-LG MASK) DEVI See admin instructions. use with inhaler  0   No current facility-administered medications for this visit.     Musculoskeletal: Strength & Muscle Tone: UTA Gait & Station: UTA Patient leans: N/A  Psychiatric Specialty Exam: Review of Systems  Psychiatric/Behavioral: Negative for agitation, behavioral problems, confusion, decreased concentration, dysphoric mood, hallucinations, self-injury, sleep disturbance and suicidal ideas. The patient is not nervous/anxious and is not hyperactive.   All other systems reviewed and are negative.   There were no vitals taken for this visit.There is no height or weight on file to calculate BMI.  General Appearance: UTA  Eye Contact:  UTA  Speech:  Clear and Coherent  Volume:  Normal  Mood:  Euthymic  Affect:  UTA  Thought Process:  Goal Directed and Descriptions of Associations: Intact  Orientation:  Full (Time, Place, and Person)  Thought Content: Logical   Suicidal Thoughts:  No  Homicidal Thoughts:  No  Memory:  Immediate;   Fair Recent;   Fair Remote;   Fair  Judgement:  Fair  Insight:  Fair  Psychomotor Activity:  UTA  Concentration:  Concentration: Fair and Attention Span: Fair  Recall:  [employer] of Knowledge: Fair  Language: Fair  Akathisia:  No  Handed:  Right  AIMS (if indicated): UTA  Assets:  Communication Skills Desire for Improvement Housing Social Support  ADL's:  Intact  Cognition: WNL  Sleep:  Fair   Screenings: PHQ2-9     Office Visit from 02/18/2019 in [place] at [facility] Visit from 02/06/2018 in [place] at [facility] Visit from 02/04/2017 in [place] at [facility] Visit from 12/13/2015 in [place] at [facility] from 02/07/2015 in [place] [facility] at [facility]  PHQ-2 Total Score  0  0  1  0  0  PHQ-9 Total Score  0  --  4  --  --       Assessment and Plan: Deondrea is a 74 year old African-American female, widowed, lives in [place], has a history of depression, tobacco use disorder, OSA on CPAP, hypertension, hyperlipidemia, diabetes melitis was evaluated by phone today.  Patient is biologically predisposed given her history of mental health problems in her family.  She also has a history of trauma.  Patient however is stable on current medication regimen.  Plan as noted below.  Plan MDD in remission Zoloft 75 mg p.o. daily  Insomnia-stable Continue sleep hygiene techniques  Tobacco use disorder-improving She continues to cut back, provided smoking cessation counseling.  Follow-up in clinic in 3 months or sooner if needed.  I have spent atleast 15 minutes non- face to face with patient today. More than 50 % of the time was spent for ordering medications and test ,psychoeducation and supportive psychotherapy and care coordination,as well as documenting clinical information in electronic health record. This note was generated in part or whole with voice recognition software. Voice recognition is usually quite accurate but there are transcription errors that can and very often do occur. I apologize for any typographical errors that were not detected and corrected.       Ursula Alert, MD 09/23/2019, 12:21 PM

## 2019-11-06 ENCOUNTER — Other Ambulatory Visit: Payer: Self-pay | Admitting: Internal Medicine

## 2019-11-07 ENCOUNTER — Other Ambulatory Visit: Payer: Self-pay | Admitting: Internal Medicine

## 2019-12-17 IMAGING — CT CT CHEST LUNG CANCER SCREENING LOW DOSE W/O CM
2 of 5 series · 15 of 40 positions shown, 18 images · non-contrast
Comparison: 09/12/2014 chest radiograph.  No prior CT.

CLINICAL DATA: Asymptomatic current smoker with 49 pack-year
history.

EXAM:
CT CHEST WITHOUT CONTRAST LOW-DOSE FOR LUNG CANCER SCREENING
TECHNIQUE: Multidetector CT imaging of the chest was performed following the
standard protocol without IV contrast.

[Series 3: lung · axial · 0.69mm/px · z∈[-1242,-976]mm · 12 of 294 slices shown, 15 images (1 of 2)]
[im 14/294  mediastinal]
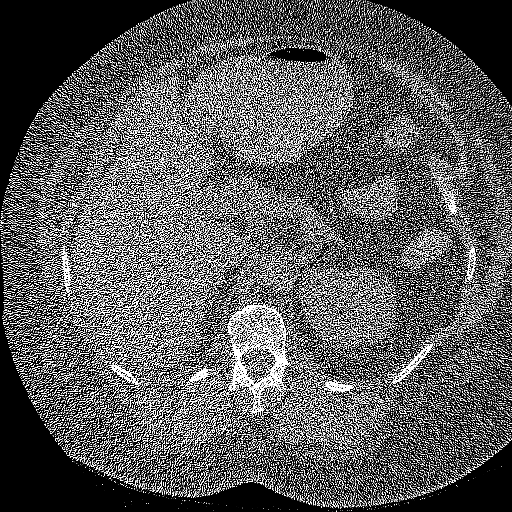
[im 14/294  lung]
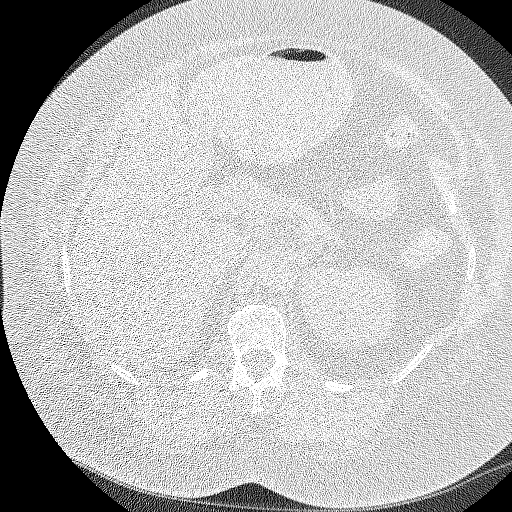
[im 40/294  lung]
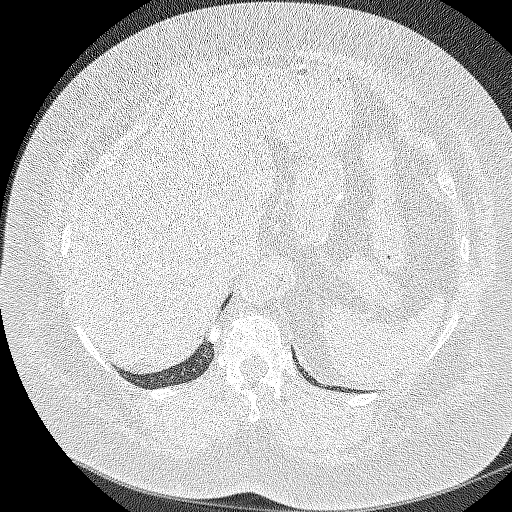
[im 67/294  lung]
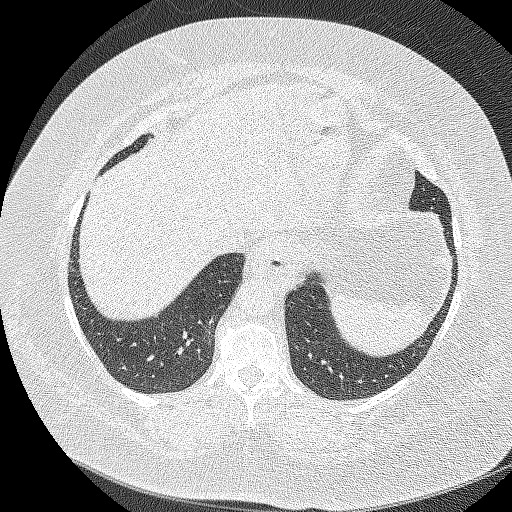
[im 94/294  lung]
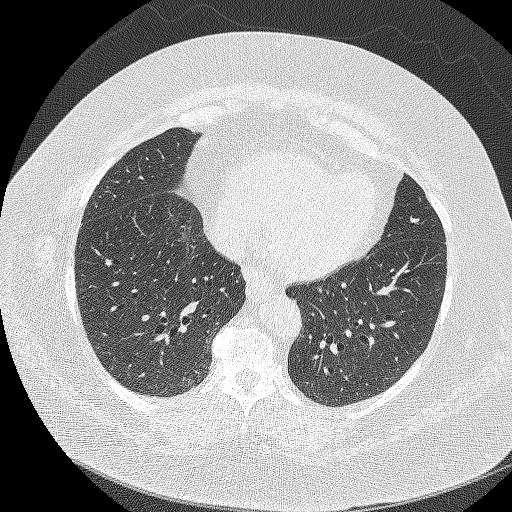
[im 107/294  mediastinal]
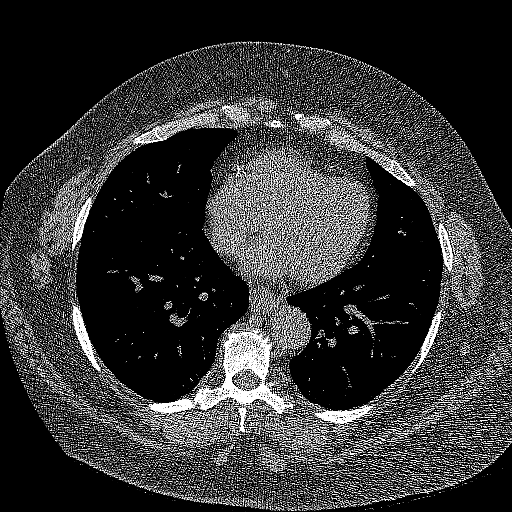
[im 107/294  lung]
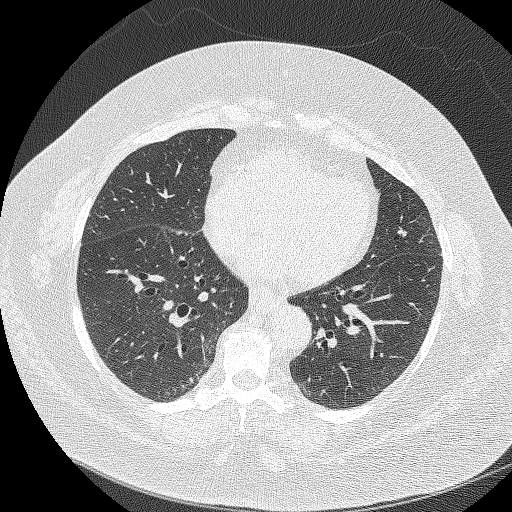
[im 134/294  lung]
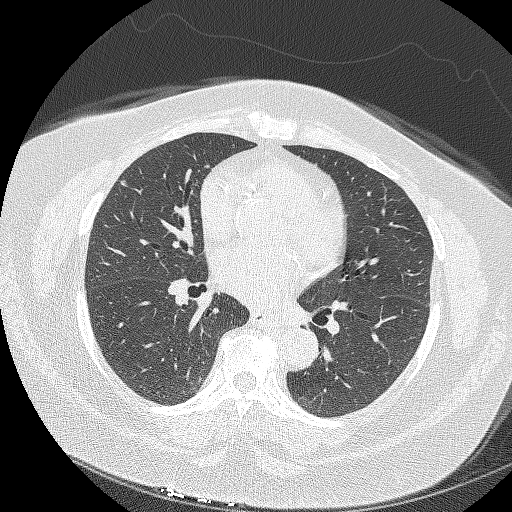
[im 160/294  lung]
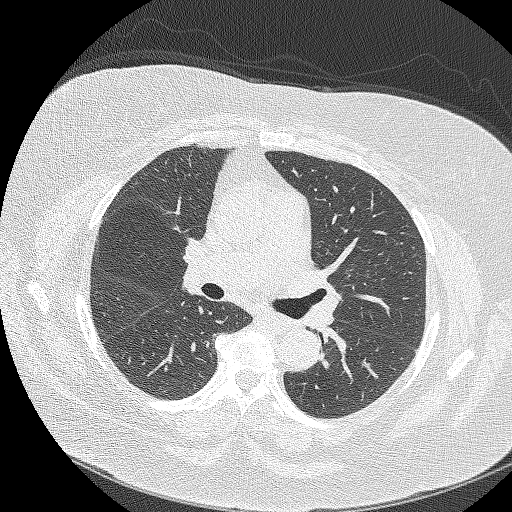
[im 187/294  lung]
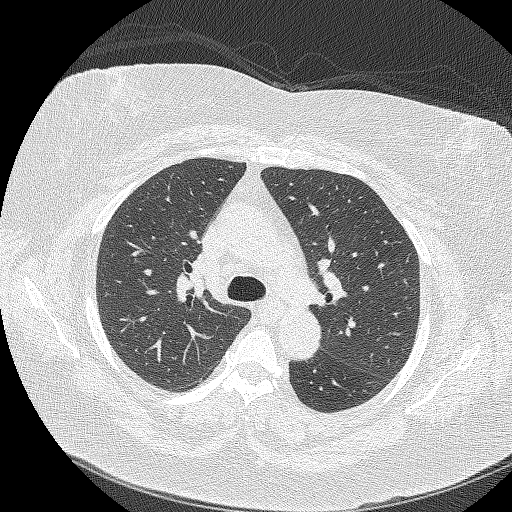
[im 200/294  mediastinal]
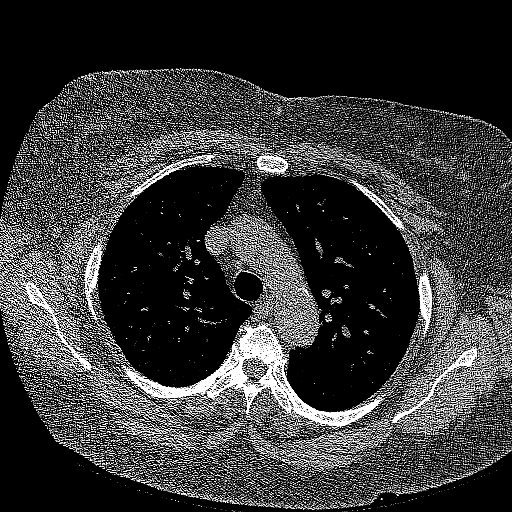
[im 200/294  lung]
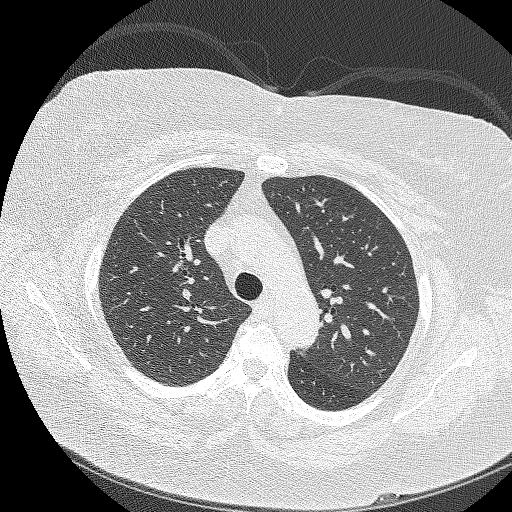
[im 227/294  lung]
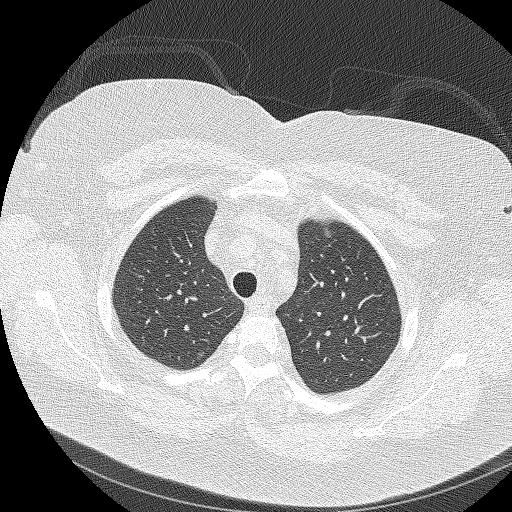
[im 254/294  lung]
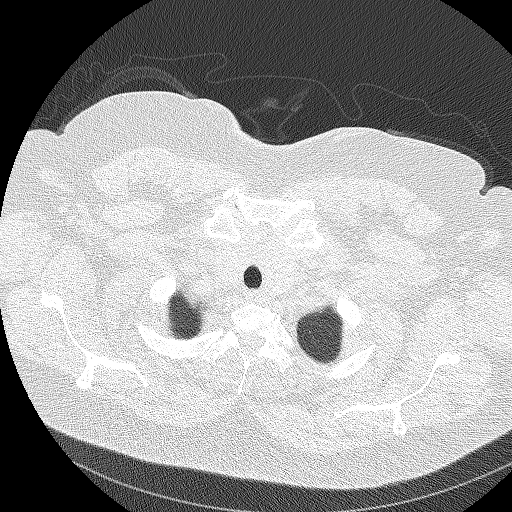
[im 280/294  lung]
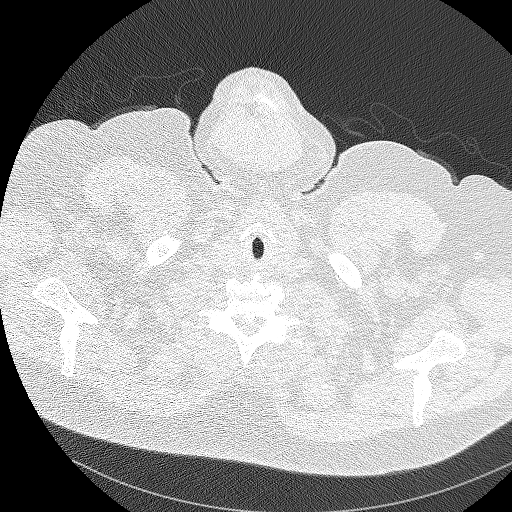

[Series 4: lung · coronal · 0.58mm/px · 3 of 353 slices shown (2 of 2)]
[im 71/353  lung]
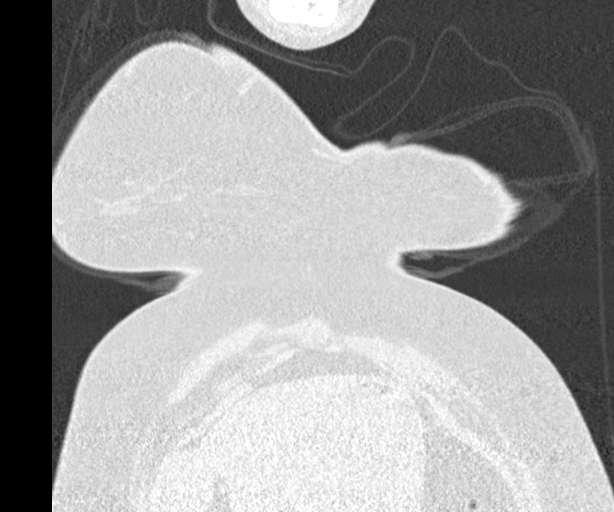
[im 141/353  lung]
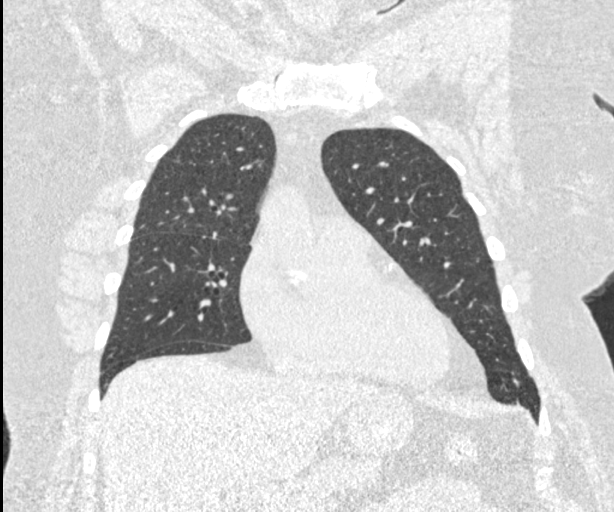
[im 212/353  lung]
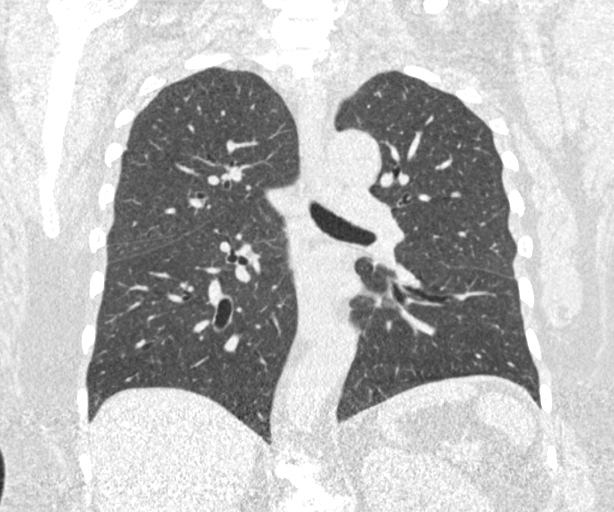

[15 of 40 positions shown; findings below may reference images not displayed]

FINDINGS: Cardiovascular: Aortic atherosclerosis. Tortuous thoracic aorta.
Normal heart size, without pericardial effusion. Multivessel
coronary artery atherosclerosis.

Mediastinum/Nodes: Mild thyroid prominence with extension into the
upper chest. No well-defined dominant mass. No mediastinal or
definite hilar adenopathy, given limitations of unenhanced CT. Small
hiatal hernia.

Lungs/Pleura: No pleural fluid. Mild centrilobular emphysema. Left
upper lobe non solid pulmonary nodule of volume derived equivalent
diameter 8.2 mm. Solid nodules of maximally volume derived
equivalent diameter 5.4 mm in the right middle lobe.

Upper Abdomen: Normal imaged portions of the liver, spleen,
pancreas, adrenal glands, left kidney. Surgical changes about the
proximal stomach.

Musculoskeletal: No acute osseous abnormality. Moderate thoracic
spondylosis.
IMPRESSION: 1. Lung-RADS 2, benign appearance or behavior. Continue annual
screening with low-dose chest CT without contrast in 12 months.
2. Aortic atherosclerosis (U1EO2-U4D.D), coronary artery
atherosclerosis and emphysema (U1EO2-ME2.4).
3. Small hiatal hernia.

## 2019-12-21 ENCOUNTER — Encounter: Payer: Self-pay | Admitting: Psychiatry

## 2019-12-21 ENCOUNTER — Other Ambulatory Visit: Payer: Self-pay

## 2019-12-21 ENCOUNTER — Telehealth (INDEPENDENT_AMBULATORY_CARE_PROVIDER_SITE_OTHER): Payer: Medicare HMO | Admitting: Psychiatry

## 2019-12-21 DIAGNOSIS — F17200 Nicotine dependence, unspecified, uncomplicated: Secondary | ICD-10-CM | POA: Diagnosis not present

## 2019-12-21 DIAGNOSIS — F3342 Major depressive disorder, recurrent, in full remission: Secondary | ICD-10-CM

## 2019-12-21 DIAGNOSIS — F5105 Insomnia due to other mental disorder: Secondary | ICD-10-CM

## 2019-12-21 NOTE — Progress Notes (Signed)
Provider Location : [facility] Patient Location : Home  Virtual Visit via Video Note  I connected with Kim Harding on 12/21/19 at 10:30 AM EDT by a video enabled telemedicine application and verified that I am speaking with the correct person using two identifiers.   I discussed the limitations of evaluation and management by telemedicine and the availability of in person appointments. The patient expressed understanding and agreed to proceed.  I discussed the assessment and treatment plan with the patient. The patient was provided an opportunity to ask questions and all were answered. The patient agreed with the plan and demonstrated an understanding of the instructions.   The patient was advised to call back or seek an in-person evaluation if the symptoms worsen or if the condition fails to improve as anticipated.   [facility] MD OP Progress Note  12/21/2019 9:40 PM Kim Harding  MRN:  [id number]  Chief Complaint:  Chief Complaint    Follow-up     HPI: Kim Harding is a 74 year old African-American female, widowed, lives in [place], has a history of MDD, diabetes melitis, hypertension, hyperlipidemia, OSA on CPAP, insomnia, tobacco use disorder was evaluated by telemedicine today.  Patient today reports she is currently doing well with regards to her mood.  She stopped taking the Zoloft since she was feeling better.  She reports since stopping the Zoloft she has not observed any recurrence of her mood symptoms.  She hence wants to stay away from any psychotropic medications at this time.  She reports sleep is good.  She denies any suicidality, homicidality or perceptual disturbances.  She reports she is planning to move to [facility] to be closer to her son.  She reports her son has arranged her to move to her own place however she will be close to her son's house and hence will have support.  She is excited about that.  She denies any other concerns today. Visit Diagnosis:     ICD-10-CM   1. MDD (major depressive disorder), recurrent, in full remission ([place])  F33.42   2. Insomnia due to mental condition  F51.05   3. Tobacco use disorder  F17.200     Past Psychiatric History: I have reviewed past psychiatric history from my progress note on 05/14/2018-Chantix, Wellbutrin  Past Medical History:  Past Medical History:  Diagnosis Date  . Allergy   . Arthritis    fingers  . Chicken pox   . COPD (chronic obstructive pulmonary disease) ([place])   . Degenerative disc disease, cervical    limited left turn of head  . Depression   . Diabetes mellitus without complication ([place])   . Diabetes mellitus, type II ([place])   . Diverticulitis   . GERD (gastroesophageal reflux disease)   . Hyperlipidemia   . Hypertension   . Schizophrenia ([place])   . Sleep apnea    CPAP  . Vertigo    1 episode - approx 2018  . Wears dentures    full upper    Past Surgical History:  Procedure Laterality Date  . ABDOMINAL HYSTERECTOMY  1994   partial  . BARIATRIC SURGERY     sleeve--2014  . CARPAL TUNNEL RELEASE    . CATARACT EXTRACTION W/PHACO Left 04/12/2019   Procedure: CATARACT EXTRACTION PHACO AND INTRAOCULAR LENS PLACEMENT (IOC) LEFT DIABETES 00:52.3         8.4%      5.12;  Surgeon: Eulogio Bear, MD;  Location: [facility];  Service: Ophthalmology;  Laterality: Left;  Latex  Diabetic - oral meds sleep apnea  . CATARACT EXTRACTION W/PHACO Right 05/03/2019   Procedure: CATARACT EXTRACTION PHACO AND INTRAOCULAR LENS PLACEMENT (IOC) RIGHT DIABETIC 01:07.6  9.8%  6.77;  Surgeon: Eulogio Bear, MD;  Location: [facility];  Service: Ophthalmology;  Laterality: Right;  Latex Diabetic - oral meds sleep apnea  . CHOLECYSTECTOMY  2012  . REPLACEMENT TOTAL KNEE BILATERAL    . ROTATOR CUFF REPAIR Right 11/21/2015  . SHOULDER SURGERY Left 09/2014   rotator cuff  . TONSILLECTOMY  1960    Family Psychiatric History: I have reviewed family psychiatric history from my  progress note on 05/14/2018  Family History:  Family History  Problem Relation Age of Onset  . Heart disease Mother   . Arthritis Father   . Cancer Father        Prostate  . Diabetes Sister   . Drug abuse Sister   . Diabetes Brother   . Drug abuse Brother   . Alcohol abuse Brother   . Drug abuse Brother   . Breast cancer Paternal Aunt     Social History: Reviewed social history from my progress note on 05/14/2018 Social History   Socioeconomic History  . Marital status: Widowed    Spouse name: Not on file  . Number of children: 3  . Years of education: Not on file  . Highest education level: High school graduate  Occupational History  . Occupation: retired  Tobacco Use  . Smoking status: Current Every Day Smoker    Packs/day: 1.00    Years: 49.00    Pack years: 49.00    Types: Cigarettes  . Smokeless tobacco: Never Used  . Tobacco comment: currently 5 cig per day - 12/21/2019  Substance and Sexual Activity  . Alcohol use: No    Alcohol/week: 0.0 standard drinks  . Drug use: No  . Sexual activity: Not Currently  Other Topics Concern  . Not on file  Social History Narrative   Church activities, [employer]   Social Determinants of Health   Financial Resource Strain:   . Difficulty of Paying Living Expenses:   Food Insecurity:   . Worried About Charity fundraiser in the Last Year:   . Arboriculturist in the Last Year:   Transportation Needs:   . Film/video editor (Medical):   Marland Kitchen Lack of Transportation (Non-Medical):   Physical Activity:   . Days of Exercise per Week:   . Minutes of Exercise per Session:   Stress:   . Feeling of Stress :   Social Connections:   . Frequency of Communication with Friends and Family:   . Frequency of Social Gatherings with Friends and Family:   . Attends Religious Services:   . Active Member of Clubs or Organizations:   . Attends Archivist Meetings:   Marland Kitchen Marital Status:     Allergies:  Allergies   Allergen Reactions  . Aspirin Hives and Itching  . Bupropion Hives and Itching  . Latex Hives, Itching and Swelling    Gloves when worn, some tapes  . Naproxen Hives and Itching  . Propoxyphene Hives and Itching    Metabolic Disorder Labs: Lab Results  Component Value Date   HGBA1C 6.5 02/18/2019   MPG 134 02/06/2018   No results found for: PROLACTIN Lab Results  Component Value Date   CHOL 140 02/18/2019   TRIG 168.0 (H) 02/18/2019   HDL 42.90 02/18/2019   CHOLHDL 3 02/18/2019   VLDL  33.6 02/18/2019   LDLCALC 63 02/18/2019   LDLCALC 79 02/06/2018   Lab Results  Component Value Date   TSH 3.940 07/31/2018    Therapeutic Level Labs: No results found for: LITHIUM No results found for: VALPROATE No components found for:  CBMZ  Current Medications: Current Outpatient Medications  Medication Sig Dispense Refill  . albuterol (PROVENTIL HFA;VENTOLIN HFA) 108 (90 Base) MCG/ACT inhaler Inhale 2 puffs into the lungs every 6 (six) hours as needed for wheezing or shortness of breath. 6.7 Inhaler 3  . Cholecalciferol (VITAMIN D-3) 1000 UNITS CAPS Take 1 capsule by mouth daily.    Marland Kitchen esomeprazole (NEXIUM) 40 MG capsule TAKE 1 CAPSULE BY MOUTH  DAILY AT 12 NOON 90 capsule 2  . fexofenadine (ALLEGRA) 180 MG tablet Take 180 mg by mouth daily.    . fluticasone (FLONASE) 50 MCG/ACT nasal spray Place 2 sprays into both nostrils daily. 16 g 6  . hydrochlorothiazide (HYDRODIURIL) 25 MG tablet TAKE 1 TABLET BY MOUTH  DAILY 90 tablet 0  . metFORMIN (GLUCOPHAGE) 500 MG tablet Take 1 tablet (500 mg total) by mouth 2 (two) times daily with a meal. 180 tablet 2  . metoprolol succinate (TOPROL-XL) 100 MG 24 hr tablet TAKE 1 TABLET BY MOUTH  DAILY WITH OR IMMEDIATELY  FOLLOWING A MEAL 90 tablet 2  . Multiple Vitamin (MULTIVITAMIN) tablet Take 1 tablet by mouth daily.    . Omega-3 Fatty Acids (FISH OIL PO) Take by mouth daily.    . potassium chloride SA (KLOR-CON) 20 MEQ tablet TAKE 1 TABLET BY  MOUTH  DAILY 90 tablet 0  . Respiratory Therapy Supplies (ALL FLOW 3000 PFT FILTER) KIT by Does not apply route.    . rosuvastatin (CRESTOR) 10 MG tablet TAKE 1 TABLET BY MOUTH  DAILY 90 tablet 1  . Spacer/Aero Chamber Mouthpiece MISC 1 Units by Does not apply route as needed. 1 each 0  . Spacer/Aero-Holding Chambers (OPTICHAMBER DIAMOND-LG MASK) DEVI See admin instructions. use with inhaler  0  . sertraline (ZOLOFT) 50 MG tablet Take 1.5 tablets (75 mg total) by mouth daily. (Patient not taking: Reported on 12/21/2019) 135 tablet 0   No current facility-administered medications for this visit.     Musculoskeletal: Strength & Muscle Tone: UTA Gait & Station: normal Patient leans: N/A  Psychiatric Specialty Exam: Review of Systems  Psychiatric/Behavioral: Negative for agitation, behavioral problems, confusion, decreased concentration, dysphoric mood, hallucinations, self-injury, sleep disturbance and suicidal ideas. The patient is not nervous/anxious and is not hyperactive.   All other systems reviewed and are negative.   There were no vitals taken for this visit.There is no height or weight on file to calculate BMI.  General Appearance: Casual  Eye Contact:  Fair  Speech:  Clear and Coherent  Volume:  Normal  Mood:  Euthymic  Affect:  Congruent  Thought Process:  Goal Directed and Descriptions of Associations: Intact  Orientation:  Full (Time, Place, and Person)  Thought Content: Logical   Suicidal Thoughts:  No  Homicidal Thoughts:  No  Memory:  Immediate;   Fair Recent;   Fair Remote;   Fair  Judgement:  Fair  Insight:  Fair  Psychomotor Activity:  Normal  Concentration:  Concentration: Fair and Attention Span: Fair  Recall:  [employer] of Knowledge: Fair  Language: Fair  Akathisia:  No  Handed:  Right  AIMS (if indicated): UTA  Assets:  Communication Skills Desire for Improvement Housing Social Support  ADL's:  Intact  Cognition:  WNL  Sleep:  Fair    Screenings: PHQ2-9     Office Visit from 02/18/2019 in [place] at [facility] Visit from 02/06/2018 in [place] at [facility] Visit from 02/04/2017 in [place] at [facility] Visit from 12/13/2015 in [place] at [facility] Visit from 02/07/2015 in [place] at [facility]  PHQ-2 Total Score  0  0  1  0  0  PHQ-9 Total Score  0  --  4  --  --       Assessment and Plan: Karoline Fleer is a 74 year old African-American female, widowed, [place], has a history of depression, tobacco use disorder, OSA on CPAP, hypertension, hyperlipidemia, diabetes melitis was evaluated by telemedicine today.  Patient is currently noncompliant with medications and reports mood symptoms are stable.  Plan as noted below.  Plan MDD in remission Patient is currently noncompliant on Zoloft.  Insomnia-stable Continue sleep hygiene techniques  Tobacco use disorder-improving Provided smoking cessation counseling.  Follow-up in clinic as needed.  Discussed with patient to sign a release to release medical records to her new provider in [place].  I have spent atleast 20 minutes non face to face with patient today. More than 50 % of the time was spent  obtaining and to review and separately obtained history , ordering medications and test ,psychoeducation and supportive psychotherapy and care coordination,as well as documenting clinical information in electronic health record. This note was generated in part or whole with voice recognition software. Voice recognition is usually quite accurate but there are transcription errors that can and very often do occur. I apologize for any typographical errors that were not detected and corrected.      Ursula Alert, MD 12/21/2019, 9:40 PM

## 2019-12-30 ENCOUNTER — Ambulatory Visit: Payer: Medicare HMO | Admitting: Psychiatry

## 2020-01-06 ENCOUNTER — Other Ambulatory Visit: Payer: Self-pay | Admitting: Internal Medicine

## 2020-01-09 ENCOUNTER — Other Ambulatory Visit: Payer: Self-pay | Admitting: Internal Medicine

## 2020-01-11 ENCOUNTER — Telehealth: Payer: Self-pay | Admitting: Internal Medicine

## 2020-01-11 NOTE — Telephone Encounter (Signed)
Patient called today about a refill request  She stated she thought [facility] sent the request over for her metformin. Patient has been without medication for several days now    [employer]

## 2020-01-18 MED ORDER — METFORMIN HCL 500 MG PO TABS: 500.0000 mg | ORAL_TABLET | Freq: Two times a day (BID) | ORAL | 0 refills | Status: DC

## 2020-01-18 NOTE — Addendum Note (Signed)
Addended by: Roena Malady on: 01/18/2020 09:53 AM   Modules accepted: Orders

## 2020-01-18 NOTE — Telephone Encounter (Signed)
Rx sent through [facility]  

## 2020-01-26 ENCOUNTER — Other Ambulatory Visit: Payer: Self-pay | Admitting: Internal Medicine

## 2020-01-27 ENCOUNTER — Other Ambulatory Visit: Payer: Self-pay | Admitting: Internal Medicine

## 2020-02-16 ENCOUNTER — Other Ambulatory Visit: Payer: Self-pay | Admitting: Internal Medicine

## 2020-02-21 ENCOUNTER — Encounter: Payer: Medicare HMO | Admitting: Internal Medicine

## 2020-02-28 ENCOUNTER — Ambulatory Visit: Payer: Medicare HMO | Admitting: Internal Medicine

## 2020-02-28 ENCOUNTER — Encounter: Payer: Self-pay | Admitting: Internal Medicine

## 2020-02-28 ENCOUNTER — Other Ambulatory Visit: Payer: Self-pay

## 2020-02-28 VITALS — BP 120/66 | HR 73 | Temp 97.7°F | Ht 64.5 in | Wt 249.0 lb

## 2020-02-28 DIAGNOSIS — F259 Schizoaffective disorder, unspecified: Secondary | ICD-10-CM | POA: Diagnosis not present

## 2020-02-28 DIAGNOSIS — J449 Chronic obstructive pulmonary disease, unspecified: Secondary | ICD-10-CM

## 2020-02-28 DIAGNOSIS — F5105 Insomnia due to other mental disorder: Secondary | ICD-10-CM | POA: Diagnosis not present

## 2020-02-28 DIAGNOSIS — F331 Major depressive disorder, recurrent, moderate: Secondary | ICD-10-CM | POA: Diagnosis not present

## 2020-02-28 DIAGNOSIS — Z9989 Dependence on other enabling machines and devices: Secondary | ICD-10-CM

## 2020-02-28 DIAGNOSIS — I10 Essential (primary) hypertension: Secondary | ICD-10-CM | POA: Diagnosis not present

## 2020-02-28 DIAGNOSIS — E7800 Pure hypercholesterolemia, unspecified: Secondary | ICD-10-CM | POA: Diagnosis not present

## 2020-02-28 DIAGNOSIS — E119 Type 2 diabetes mellitus without complications: Secondary | ICD-10-CM

## 2020-02-28 DIAGNOSIS — I2510 Atherosclerotic heart disease of native coronary artery without angina pectoris: Secondary | ICD-10-CM | POA: Diagnosis not present

## 2020-02-28 DIAGNOSIS — G4733 Obstructive sleep apnea (adult) (pediatric): Secondary | ICD-10-CM

## 2020-02-28 DIAGNOSIS — M1990 Unspecified osteoarthritis, unspecified site: Secondary | ICD-10-CM

## 2020-02-28 DIAGNOSIS — K219 Gastro-esophageal reflux disease without esophagitis: Secondary | ICD-10-CM | POA: Diagnosis not present

## 2020-02-28 DIAGNOSIS — Z0000 Encounter for general adult medical examination without abnormal findings: Secondary | ICD-10-CM | POA: Diagnosis not present

## 2020-02-28 LAB — COMPREHENSIVE METABOLIC PANEL
ALT: 12 U/L (ref 0–35)
AST: 15 U/L (ref 0–37)
Albumin: 3.9 g/dL (ref 3.5–5.2)
Alkaline Phosphatase: 75 U/L (ref 39–117)
BUN: 12 mg/dL (ref 6–23)
CO2: 29 mEq/L (ref 19–32)
Calcium: 9.8 mg/dL (ref 8.4–10.5)
Chloride: 104 mEq/L (ref 96–112)
Creatinine, Ser: 0.86 mg/dL (ref 0.40–1.20)
GFR: 78.45 mL/min (ref >60.00)
Glucose, Bld: 107 mg/dL — ABNORMAL HIGH (ref 70–99)
Potassium: 3.8 mEq/L (ref 3.5–5.1)
Sodium: 142 mEq/L (ref 135–145)
Total Bilirubin: 0.7 mg/dL (ref 0.2–1.2)
Total Protein: 7.2 g/dL (ref 6.0–8.3)

## 2020-02-28 LAB — CBC
HCT: 40.5 % (ref 36.0–46.0)
Hemoglobin: 13.8 g/dL (ref 12.0–15.0)
MCHC: 34 g/dL (ref 30.0–36.0)
MCV: 88.6 fl (ref 78.0–100.0)
Platelets: 281 10*3/uL (ref 150.0–400.0)
RBC: 4.57 Mil/uL (ref 3.87–5.11)
RDW: 14.6 % (ref 11.5–15.5)
WBC: 9.6 10*3/uL (ref 4.0–10.5)

## 2020-02-28 LAB — VITAMIN D 25 HYDROXY (VIT D DEFICIENCY, FRACTURES): VITD: 49.4 ng/mL (ref 30.00–100.00)

## 2020-02-28 LAB — LIPID PANEL
Cholesterol: 126 mg/dL (ref 0–200)
HDL: 44.8 mg/dL (ref >39.00)
LDL Cholesterol: 56 mg/dL (ref 0–99)
NonHDL: 80.77
Total CHOL/HDL Ratio: 3
Triglycerides: 125 mg/dL (ref 0.0–149.0)
VLDL: 25 mg/dL (ref 0.0–40.0)

## 2020-02-28 LAB — MICROALBUMIN / CREATININE URINE RATIO
Creatinine,U: 45.4 mg/dL
Microalb Creat Ratio: 1.5 mg/g (ref 0.0–30.0)
Microalb, Ur: <0.7 mg/dL (ref 0.0–1.9)

## 2020-02-28 LAB — HEMOGLOBIN A1C: Hgb A1c MFr Bld: 6.5 % (ref 4.6–6.5)

## 2020-02-28 NOTE — Assessment & Plan Note (Signed)
CBC and C met today Continue as Esomeprazole

## 2020-02-28 NOTE — Assessment & Plan Note (Signed)
She is currently not taking any medications for this We will monitor

## 2020-02-28 NOTE — Assessment & Plan Note (Signed)
Managed off meds She will continue to follow with psychiatry

## 2020-02-28 NOTE — Assessment & Plan Note (Signed)
C met and lipid profile today Encouraged her to consume a low-fat diet Continue rosuvastatin

## 2020-02-28 NOTE — Patient Instructions (Signed)

## 2020-02-28 NOTE — Assessment & Plan Note (Signed)
No angina Continue Metoprolol 

## 2020-02-28 NOTE — Progress Notes (Signed)
HPI:  Patient presents the clinic today for her subsequent annual Medicare wellness exam.  She is also due to follow-up chronic conditions.  OA: Mainly in her knees.  She takes Ibuprofen as needed with good relief of symptoms.  OSA: She averages 8 hours of sleep with the use of her CPAP.  Sleep study from 02/2015 reviewed.  She follows with pulmonology.  Depression: Chronic dysthymia, managed off Sertraline.  She denies anxiety, SI/HI.  She no longers with psychiatry.  DM2: Her last A1c was 6.5%, 01/2019.  She does not check her sugars.  She is taking Metformin as prescribed.  She checks her feet routinely.  Her last eye exam was 04/2019.  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  Her last LDL was 63, 01/2019.  She denies myalgias on Rosuvastatin.  She tries to consume a low-fat diet.  HTN: Her BP today is 120/66.  She is taking Metoprolol and HCTZ as prescribed.  ECG from 09/2015 reviewed.  GERD: She denies breakthrough on Esomeprazole.  There is no upper GI on file.  Schizophrenia: She denies paranoia, audiovisual hallucinations.  Managed off meds.  She continues to follow with psychiatry.  Insomnia: Resolved.  COPD: She denies chronic cough or shortness of breath.  She is not using any inhalers at this time.  She does continue to smoke.  Past Medical History:  Diagnosis Date  . Allergy   . Arthritis    fingers  . Chicken pox   . COPD (chronic obstructive pulmonary disease) ([place])   . Degenerative disc disease, cervical    limited left turn of head  . Depression   . Diabetes mellitus without complication ([place])   . Diabetes mellitus, type II ([place])   . Diverticulitis   . GERD (gastroesophageal reflux disease)   . Hyperlipidemia   . Hypertension   . Schizophrenia ([place])   . Sleep apnea    CPAP  . Vertigo    1 episode - approx 2018  . Wears dentures    full upper    Current Outpatient Medications  Medication Sig Dispense Refill  . albuterol (PROVENTIL HFA;VENTOLIN HFA) 108 (90 Base)  MCG/ACT inhaler Inhale 2 puffs into the lungs every 6 (six) hours as needed for wheezing or shortness of breath. 6.7 Inhaler 3  . Cholecalciferol (VITAMIN D-3) 1000 UNITS CAPS Take 1 capsule by mouth daily.    Marland Kitchen esomeprazole (NEXIUM) 40 MG capsule TAKE 1 CAPSULE BY MOUTH  DAILY AT 12 NOON 90 capsule 0  . fexofenadine (ALLEGRA) 180 MG tablet Take 180 mg by mouth daily.    . fluticasone (FLONASE) 50 MCG/ACT nasal spray Place 2 sprays into both nostrils daily. 16 g 6  . hydrochlorothiazide (HYDRODIURIL) 25 MG tablet TAKE 1 TABLET BY MOUTH  DAILY 90 tablet 0  . metFORMIN (GLUCOPHAGE) 500 MG tablet Take 1 tablet (500 mg total) by mouth 2 (two) times daily with a meal. 180 tablet 0  . metoprolol succinate (TOPROL-XL) 100 MG 24 hr tablet TAKE 1 TABLET BY MOUTH  DAILY WITH OR IMMEDIATELY  FOLLOWING A MEAL 90 tablet 0  . Multiple Vitamin (MULTIVITAMIN) tablet Take 1 tablet by mouth daily.    . Omega-3 Fatty Acids (FISH OIL PO) Take by mouth daily.    . potassium chloride SA (KLOR-CON) 20 MEQ tablet TAKE 1 TABLET BY MOUTH  DAILY 90 tablet 0  . Respiratory Therapy Supplies (ALL FLOW 3000 PFT FILTER) KIT by Does not apply route.    . rosuvastatin (CRESTOR) 10 MG tablet  TAKE 1 TABLET BY MOUTH  DAILY 90 tablet 0  . sertraline (ZOLOFT) 50 MG tablet Take 1.5 tablets (75 mg total) by mouth daily. (Patient not taking: Reported on 12/21/2019) 135 tablet 0  . Spacer/Aero Chamber Mouthpiece MISC 1 Units by Does not apply route as needed. 1 each 0  . Spacer/Aero-Holding Chambers (OPTICHAMBER DIAMOND-LG MASK) DEVI See admin instructions. use with inhaler  0   No current facility-administered medications for this visit.    Allergies  Allergen Reactions  . Aspirin Hives and Itching  . Bupropion Hives and Itching  . Latex Hives, Itching and Swelling    Gloves when worn, some tapes  . Naproxen Hives and Itching  . Propoxyphene Hives and Itching    Family History  Problem Relation Age of Onset  . Heart disease  Mother   . Arthritis Father   . Cancer Father        Prostate  . Diabetes Sister   . Drug abuse Sister   . Diabetes Brother   . Drug abuse Brother   . Alcohol abuse Brother   . Drug abuse Brother   . Breast cancer Paternal Aunt     Social History   Socioeconomic History  . Marital status: Widowed    Spouse name: Not on file  . Number of children: 3  . Years of education: Not on file  . Highest education level: High school graduate  Occupational History  . Occupation: retired  Tobacco Use  . Smoking status: Current Every Day Smoker    Packs/day: 1.00    Years: 49.00    Pack years: 49.00    Types: Cigarettes  . Smokeless tobacco: Never Used  . Tobacco comment: currently 5 cig per day - 12/21/2019  Vaping Use  . Vaping Use: Never used  Substance and Sexual Activity  . Alcohol use: No    Alcohol/week: 0.0 standard drinks  . Drug use: No  . Sexual activity: Not Currently  Other Topics Concern  . Not on file  Social History Narrative   Church activities, [employer]   Social Determinants of Health   Financial Resource Strain:   . Difficulty of Paying Living Expenses:   Food Insecurity:   . Worried About Charity fundraiser in the Last Year:   . Arboriculturist in the Last Year:   Transportation Needs:   . Film/video editor (Medical):   Marland Kitchen Lack of Transportation (Non-Medical):   Physical Activity:   . Days of Exercise per Week:   . Minutes of Exercise per Session:   Stress:   . Feeling of Stress :   Social Connections:   . Frequency of Communication with Friends and Family:   . Frequency of Social Gatherings with Friends and Family:   . Attends Religious Services:   . Active Member of Clubs or Organizations:   . Attends Archivist Meetings:   Marland Kitchen Marital Status:   Intimate Partner Violence:   . Fear of Current or Ex-Partner:   . Emotionally Abused:   Marland Kitchen Physically Abused:   . Sexually Abused:     Hospitiliaztions: 03/2019, 04/2019 Cataract  surgery  Health Maintenance:    Flu: 04/2019  Tetanus: 12/2009  Pneumovax: 03/2014  Prevnar: 03/2015  Zostavax: 12/2010  Shingrix: 2020  Covid: Pfizer  Mammogram: 05/2019  Pap Smear: no longer screening  Bone Density: 03/2018  Colon Screening: 2012, 10 years  Eye Doctor: annually  Dental Exam: bianually   Providers:   PCP:  Webb Silversmith, NP              Ophthalmology: Dr. Edison Pace             Cardiologist: Dr. Nehemiah Massed             Pulmonologist: Dr. Ashby Dawes             Podiatry: Dr. Vickki Muff    I have personally reviewed and have noted:  1. The patient's medical and social history 2. Their use of alcohol, tobacco or illicit drugs 3. Their current medications and supplements 4. The patient's functional ability including ADL's, fall risks, home safety risks and hearing or visual impairment. 5. Diet and physical activities 6. Evidence for depression or mood disorder  Subjective:   Review of Systems:   Constitutional: Denies fever, malaise, fatigue, headache or abrupt weight changes.  HEENT: Denies eye pain, eye redness, ear pain, ringing in the ears, wax buildup, runny nose, nasal congestion, bloody nose, or sore throat. Respiratory: Denies difficulty breathing, shortness of breath, cough or sputum production.   Cardiovascular: Denies chest pain, chest tightness, palpitations or swelling in the hands or feet.  Gastrointestinal: Denies abdominal pain, bloating, constipation, diarrhea or blood in the stool.  GU: Denies urgency, frequency, pain with urination, burning sensation, blood in urine, odor or discharge. Musculoskeletal: Patient reports intermittent knee pain.  Denies decrease in range of motion, difficulty with gait, muscle pain or joint swelling.  Skin: Denies redness, rashes, lesions or ulcercations.  Neurological: Denies dizziness, difficulty with memory, difficulty with speech or problems with balance and coordination.  Psych: Patient has a history of depression.   Denies anxiety, SI/HI.  No other specific complaints in a complete review of systems (except as listed in HPI above).  Objective:  PE:   BP 120/66   Pulse 73    Wt Readings from Last 3 Encounters:  06/04/19 253 lb (114.8 kg)  05/26/19 253 lb 9.6 oz (115 kg)  05/03/19 248 lb 12.8 oz (112.9 kg)    General: Appears her stated age, obese, in NAD. Skin: Warm, dry and intact. No ulcerations noted. HEENT: Head: normal shape and size; Eyes: sclera white, no icterus, conjunctiva pink, PERRLA and EOMs intact;  Neck: Neck supple, trachea midline. No masses, lumps or thyromegaly present.  Cardiovascular: Normal rate and rhythm. S1,S2 noted.  No murmur, rubs or gallops noted. Trace BLE edema. No carotid bruits noted. Pulmonary/Chest: Normal effort and positive vesicular breath sounds. No respiratory distress. No wheezes, rales or ronchi noted.  Abdomen: Soft and nontender. Normal bowel sounds. No distention or masses noted. Liver, spleen and kidneys non palpable. Musculoskeletal:  Strength 5/5 BUE/BLE. No difficulty with gait. Neurological: Alert and oriented. Cranial nerves II-XII grossly intact. Coordination normal.  Psychiatric: Mood and affect normal. Behavior is normal. Judgment and thought content normal.     BMET    Component Value Date/Time   NA 140 02/18/2019 1115   K 3.8 02/18/2019 1115   CL 103 02/18/2019 1115   CO2 30 02/18/2019 1115   GLUCOSE 102 (H) 02/18/2019 1115   BUN 9 02/18/2019 1115   CREATININE 0.80 02/18/2019 1115   CREATININE 0.91 02/06/2018 1522   CALCIUM 9.9 02/18/2019 1115   GFRNONAA >60 10/07/2016 1040   GFRAA >60 10/07/2016 1040    Lipid Panel     Component Value Date/Time   CHOL 140 02/18/2019 1115   TRIG 168.0 (H) 02/18/2019 1115   HDL 42.90 02/18/2019 1115   CHOLHDL 3 02/18/2019 1115  VLDL 33.6 02/18/2019 1115   LDLCALC 63 02/18/2019 1115   LDLCALC 79 02/06/2018 1522    CBC    Component Value Date/Time   WBC 10.3 02/18/2019 1115   RBC  4.75 02/18/2019 1115   HGB 14.1 02/18/2019 1115   HCT 42.2 02/18/2019 1115   PLT 277.0 02/18/2019 1115   MCV 88.8 02/18/2019 1115   MCH 29.6 02/06/2018 1522   MCHC 33.5 02/18/2019 1115   RDW 14.7 02/18/2019 1115   LYMPHSABS 3.4 02/07/2015 1131   MONOABS 0.8 02/07/2015 1131   EOSABS 0.4 02/07/2015 1131   BASOSABS 0.0 02/07/2015 1131    Hgb A1C Lab Results  Component Value Date   HGBA1C 6.5 02/18/2019      Assessment and Plan:   Medicare Annual Wellness Visit:  Diet: She does eat lean meat. She consumes fruits and veggies daily. She does eat some fried foods. She drinks mostly flavored water. Physical activity: Sedentary, some walking Depression/mood screen: Negative, PHQ 9 score 0 Hearing: Intact to whispered voice Visual acuity: Grossly normal, performs annual eye exam  ADLs: Capable Fall risk: None Home safety: Good Cognitive evaluation: Intact to orientation, naming, recall and repetition EOL planning: No adv directives, full code/ I agree  Preventative Medicine: Encouraged her to get a flu shot in the fall.  She declines tetanus for financial reasons, advised her if she were to get cut or bitten by an animal she would need to get this done.  Pneumovax, Prevnar, Zostavax, Shingrix and Covid UTD.  Mammogram due 05/2020.  She no longer wants cervical cancer screening.  Bone density UTD.  Colon screening due 2022.  Encouraged her to consume a balanced diet and exercise regimen.  Advised her to see an eye doctor and dentist annually.  Will check CBC, C met, lipid, A1c, urine microalbumin and vitamin D today.   Next appointment: 6 months, follow-up chronic conditions   Webb Silversmith, NP This visit occurred during the SARS-CoV-2 public health emergency.  Safety protocols were in place, including screening questions prior to the visit, additional usage of staff PPE, and extensive cleaning of exam room while observing appropriate contact time as indicated for disinfecting  solutions.

## 2020-02-28 NOTE — Assessment & Plan Note (Signed)
C met, A1c and urine microalbumin today Encouraged her to consume a low carb diet and exercise for weight loss Continue Metformin Encourage routine eye exams Foot exam today Encouraged her to get flu shot in fall Pneumonia and Covid vaccines UTD

## 2020-02-28 NOTE — Assessment & Plan Note (Signed)
Continue Ibuprofen OTC Encouraged exercise for weight loss C met today

## 2020-02-28 NOTE — Assessment & Plan Note (Addendum)
Stable off meds She will continue to follow with psych Support offered

## 2020-02-28 NOTE — Assessment & Plan Note (Signed)
Encourage smoking cessation

## 2020-02-28 NOTE — Assessment & Plan Note (Signed)
Encouraged continue use of CPAP Discussed how weight loss could help improve sleep apnea symptoms She will continue to follow with pulmonology

## 2020-03-29 ENCOUNTER — Other Ambulatory Visit: Payer: Self-pay | Admitting: Internal Medicine

## 2020-04-03 ENCOUNTER — Other Ambulatory Visit: Payer: Self-pay | Admitting: Internal Medicine

## 2020-04-10 ENCOUNTER — Other Ambulatory Visit: Payer: Self-pay | Admitting: Internal Medicine

## 2020-04-20 ENCOUNTER — Other Ambulatory Visit: Payer: Self-pay | Admitting: Internal Medicine

## 2020-05-11 ENCOUNTER — Other Ambulatory Visit: Payer: Self-pay | Admitting: Internal Medicine

## 2020-06-01 ENCOUNTER — Encounter: Payer: Self-pay | Admitting: *Deleted

## 2020-06-01 ENCOUNTER — Telehealth: Payer: Self-pay | Admitting: *Deleted

## 2020-06-01 NOTE — Telephone Encounter (Signed)
Attempted to contact and schedule lung screening scan. Message left for patient to call back to schedule. 

## 2020-06-19 NOTE — Telephone Encounter (Signed)
Contacted patient in attempt to schedule lung screening scan. Patient reports she has relocated to the [facility] and will not be getting medical care in [place]. Patient is made aware that she needs to ask her new PCP to assist in her getting her annual lung screening CT scan.

## 2021-03-24 ENCOUNTER — Other Ambulatory Visit: Payer: Self-pay | Admitting: Internal Medicine

## 2021-03-27 NOTE — Telephone Encounter (Signed)
Last OV- 02/28/2020 Next OV- N/A Last Filled- 04/21/2020

## 2022-07-04 ENCOUNTER — Other Ambulatory Visit: Payer: Self-pay | Admitting: Internal Medicine

## 2024-06-30 NOTE — Telephone Encounter (Signed)
 Requested medication (s) are due for refill today: yes  Requested medication (s) are on the active medication list: no  Last refill:  03/27/21  Future visit scheduled: no  Notes to clinic:  Unable to refill per protocol, last refill by another provider.      Requested Prescriptions  Pending Prescriptions Disp Refills   potassium chloride  SA (KLOR-CON  M) 20 MEQ tablet [Pharmacy Med Name: Potassium Chloride  Crys ER 20 MEQ Oral Tablet Extended Release] 90 tablet 3    Sig: TAKE 1 TABLET BY MOUTH  DAILY     Endocrinology:  Minerals - Potassium Supplementation Failed - 06/30/2024  8:08 AM      Failed - K in normal range and within 360 days    Potassium  Date Value Ref Range Status  02/28/2020 3.8 3.5 - 5.1 mEq/L Final         Failed - Cr in normal range and within 360 days    Creat  Date Value Ref Range Status  02/06/2018 0.91 0.60 - 0.93 mg/dL Final    Comment:    For patients >77 years of age, the reference limit for Creatinine is approximately 13% higher for people identified as African-American. .    Creatinine, Ser  Date Value Ref Range Status  02/28/2020 0.86 0.40 - 1.20 mg/dL Final         Failed - Valid encounter within last 12 months    Recent Outpatient Visits   None
